# Patient Record
Sex: Female | Born: 1990 | Race: White | Hispanic: No | Smoking: Former smoker
Health system: Southern US, Community
[De-identification: ages and names within clinical notes are randomized; demographics above are authoritative.]

## PROBLEM LIST (undated history)

## (undated) DIAGNOSIS — K2270 Barrett's esophagus without dysplasia: Secondary | ICD-10-CM

## (undated) DIAGNOSIS — D649 Anemia, unspecified: Secondary | ICD-10-CM

## (undated) DIAGNOSIS — F32A Depression, unspecified: Secondary | ICD-10-CM

## (undated) DIAGNOSIS — F329 Major depressive disorder, single episode, unspecified: Secondary | ICD-10-CM

## (undated) DIAGNOSIS — F319 Bipolar disorder, unspecified: Secondary | ICD-10-CM

## (undated) DIAGNOSIS — K219 Gastro-esophageal reflux disease without esophagitis: Secondary | ICD-10-CM

## (undated) DIAGNOSIS — K5900 Constipation, unspecified: Secondary | ICD-10-CM

## (undated) DIAGNOSIS — F419 Anxiety disorder, unspecified: Secondary | ICD-10-CM

## (undated) HISTORY — DX: Constipation, unspecified: K59.00

## (undated) HISTORY — DX: Gastro-esophageal reflux disease without esophagitis: K21.9

## (undated) HISTORY — DX: Barrett's esophagus without dysplasia: K22.70

## (undated) HISTORY — PX: OOPHORECTOMY: SHX86

---
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
## 2008-01-01 ENCOUNTER — Ambulatory Visit (HOSPITAL_COMMUNITY): Payer: Self-pay | Admitting: Psychiatry

## 2008-01-13 ENCOUNTER — Ambulatory Visit (HOSPITAL_COMMUNITY): Payer: Self-pay | Admitting: Psychology

## 2008-02-03 ENCOUNTER — Ambulatory Visit (HOSPITAL_COMMUNITY): Payer: Self-pay | Admitting: Psychology

## 2008-10-19 ENCOUNTER — Emergency Department (HOSPITAL_COMMUNITY): Admission: EM | Admit: 2008-10-19 | Discharge: 2008-10-20 | Payer: Self-pay | Admitting: Emergency Medicine

## 2008-11-01 ENCOUNTER — Emergency Department (HOSPITAL_COMMUNITY): Admission: EM | Admit: 2008-11-01 | Discharge: 2008-11-01 | Payer: Self-pay | Admitting: Emergency Medicine

## 2008-11-02 ENCOUNTER — Inpatient Hospital Stay (HOSPITAL_COMMUNITY): Admission: EM | Admit: 2008-11-02 | Discharge: 2008-11-04 | Payer: Self-pay | Admitting: *Deleted

## 2008-11-02 ENCOUNTER — Ambulatory Visit: Payer: Self-pay | Admitting: *Deleted

## 2008-12-06 ENCOUNTER — Ambulatory Visit (HOSPITAL_COMMUNITY): Payer: Self-pay | Admitting: Psychology

## 2008-12-16 ENCOUNTER — Ambulatory Visit (HOSPITAL_COMMUNITY): Payer: Self-pay | Admitting: Psychiatry

## 2008-12-27 ENCOUNTER — Ambulatory Visit (HOSPITAL_COMMUNITY): Payer: Self-pay | Admitting: Psychology

## 2009-01-10 ENCOUNTER — Ambulatory Visit (HOSPITAL_COMMUNITY): Payer: Self-pay | Admitting: Psychology

## 2009-01-13 ENCOUNTER — Ambulatory Visit (HOSPITAL_COMMUNITY): Payer: Self-pay | Admitting: Psychiatry

## 2009-01-31 ENCOUNTER — Ambulatory Visit (HOSPITAL_COMMUNITY): Payer: Self-pay | Admitting: Psychology

## 2009-02-21 ENCOUNTER — Ambulatory Visit (HOSPITAL_COMMUNITY): Payer: Self-pay | Admitting: Psychology

## 2009-06-18 ENCOUNTER — Emergency Department (HOSPITAL_COMMUNITY): Admission: EM | Admit: 2009-06-18 | Discharge: 2009-06-18 | Payer: Self-pay | Admitting: Emergency Medicine

## 2009-07-10 ENCOUNTER — Encounter: Payer: Self-pay | Admitting: Orthopedic Surgery

## 2009-07-11 ENCOUNTER — Emergency Department (HOSPITAL_COMMUNITY): Admission: EM | Admit: 2009-07-11 | Discharge: 2009-07-12 | Payer: Self-pay | Admitting: Emergency Medicine

## 2009-07-12 ENCOUNTER — Ambulatory Visit: Payer: Self-pay | Admitting: Psychiatry

## 2009-07-12 ENCOUNTER — Inpatient Hospital Stay (HOSPITAL_COMMUNITY): Admission: AD | Admit: 2009-07-12 | Discharge: 2009-07-19 | Payer: Self-pay | Admitting: Psychiatry

## 2010-01-11 ENCOUNTER — Emergency Department (HOSPITAL_COMMUNITY): Admission: EM | Admit: 2010-01-11 | Discharge: 2010-01-11 | Payer: Self-pay | Admitting: Emergency Medicine

## 2010-01-13 ENCOUNTER — Emergency Department (HOSPITAL_COMMUNITY): Admission: EM | Admit: 2010-01-13 | Discharge: 2010-01-13 | Payer: Self-pay | Admitting: Emergency Medicine

## 2010-01-30 ENCOUNTER — Emergency Department (HOSPITAL_COMMUNITY): Admission: EM | Admit: 2010-01-30 | Discharge: 2010-01-30 | Payer: Self-pay | Admitting: Emergency Medicine

## 2010-04-17 NOTE — Letter (Signed)
Summary: *Orthopedic No Show Letter  [facility]  [address] [place] [address]  [place], [place] [address]   Phone: [phone]  Fax: [phone]      07/10/2009   Lyle Leisner [address] [place], [place]  [address]    Dear Ms. MARGENSEY,   Our records indicate that you missed your scheduled appointment with Dr. Beaulah Corin on 07/05/09.  Please contact this office to reschedule your appointment as soon as possible.  It is important that you keep your scheduled appointments with your physician, so we can provide you the best care possible.  We have enclosed an appointment card for your convenience.      Sincerely,   Dr. Terrance Mass, MD [facility] Phone [phone]

## 2010-05-29 LAB — URINALYSIS, ROUTINE W REFLEX MICROSCOPIC
Nitrite: NEGATIVE
Protein, ur: 30 mg/dL — AB
Urobilinogen, UA: 0.2 mg/dL (ref 0.0–1.0)

## 2010-05-29 LAB — URINE MICROSCOPIC-ADD ON

## 2010-05-29 LAB — URINE CULTURE: Colony Count: 30000

## 2010-05-30 LAB — URINALYSIS, ROUTINE W REFLEX MICROSCOPIC
Glucose, UA: NEGATIVE mg/dL
Leukocytes, UA: NEGATIVE
Nitrite: NEGATIVE
Protein, ur: 100 mg/dL — AB

## 2010-05-30 LAB — WET PREP, GENITAL
Trich, Wet Prep: NONE SEEN
Yeast Wet Prep HPF POC: NONE SEEN

## 2010-05-30 LAB — RPR: RPR Ser Ql: NONREACTIVE

## 2010-05-30 LAB — GC/CHLAMYDIA PROBE AMP, GENITAL: Chlamydia, DNA Probe: NEGATIVE

## 2010-05-30 LAB — URINE MICROSCOPIC-ADD ON

## 2010-05-31 ENCOUNTER — Emergency Department (HOSPITAL_COMMUNITY)
Admission: EM | Admit: 2010-05-31 | Discharge: 2010-05-31 | Disposition: A | Discharge status: Home / Self Care | Payer: No Typology Code available for payment source | Attending: Emergency Medicine | Admitting: Emergency Medicine

## 2010-05-31 ENCOUNTER — Emergency Department (HOSPITAL_COMMUNITY): Payer: No Typology Code available for payment source

## 2010-05-31 DIAGNOSIS — S93409A Sprain of unspecified ligament of unspecified ankle, initial encounter: Secondary | ICD-10-CM | POA: Insufficient documentation

## 2010-05-31 DIAGNOSIS — Y92009 Unspecified place in unspecified non-institutional (private) residence as the place of occurrence of the external cause: Secondary | ICD-10-CM | POA: Insufficient documentation

## 2010-05-31 DIAGNOSIS — X500XXA Overexertion from strenuous movement or load, initial encounter: Secondary | ICD-10-CM | POA: Insufficient documentation

## 2010-06-05 ENCOUNTER — Other Ambulatory Visit (HOSPITAL_COMMUNITY)
Admission: RE | Admit: 2010-06-05 | Discharge: 2010-06-05 | Disposition: A | Discharge status: Home / Self Care | Payer: No Typology Code available for payment source | Source: Ambulatory Visit | Attending: Nurse Practitioner | Admitting: Nurse Practitioner

## 2010-06-05 ENCOUNTER — Other Ambulatory Visit (HOSPITAL_COMMUNITY)
Admission: RE | Admit: 2010-06-05 | Discharge: 2010-06-05 | Disposition: A | Discharge status: Home / Self Care | Payer: No Typology Code available for payment source | Source: Ambulatory Visit | Attending: Family Medicine | Admitting: Family Medicine

## 2010-06-05 ENCOUNTER — Inpatient Hospital Stay (HOSPITAL_COMMUNITY): Admission: RE | Admit: 2010-06-05 | Payer: Self-pay | Source: Ambulatory Visit

## 2010-06-05 ENCOUNTER — Other Ambulatory Visit: Payer: Self-pay | Admitting: Nurse Practitioner

## 2010-06-05 DIAGNOSIS — N870 Mild cervical dysplasia: Secondary | ICD-10-CM | POA: Insufficient documentation

## 2010-06-05 DIAGNOSIS — R87610 Atypical squamous cells of undetermined significance on cytologic smear of cervix (ASC-US): Secondary | ICD-10-CM | POA: Insufficient documentation

## 2010-06-05 LAB — RAPID URINE DRUG SCREEN, HOSP PERFORMED
Barbiturates: NOT DETECTED
Benzodiazepines: POSITIVE — AB
Cocaine: POSITIVE — AB

## 2010-06-05 LAB — CBC
HCT: 37.5 % (ref 36.0–46.0)
Hemoglobin: 12.9 g/dL (ref 12.0–15.0)
MCHC: 34.4 g/dL (ref 30.0–36.0)
MCV: 90.2 fL (ref 78.0–100.0)
RBC: 4.16 MIL/uL (ref 3.87–5.11)
RDW: 14.3 % (ref 11.5–15.5)

## 2010-06-05 LAB — BASIC METABOLIC PANEL
CO2: 29 mEq/L (ref 19–32)
Calcium: 8.9 mg/dL (ref 8.4–10.5)
Chloride: 107 mEq/L (ref 96–112)
Glucose, Bld: 73 mg/dL (ref 70–99)
Potassium: 3.9 mEq/L (ref 3.5–5.1)
Sodium: 140 mEq/L (ref 135–145)

## 2010-06-05 LAB — URINALYSIS, ROUTINE W REFLEX MICROSCOPIC
Bilirubin Urine: NEGATIVE
Glucose, UA: NEGATIVE mg/dL
Hgb urine dipstick: NEGATIVE
Ketones, ur: NEGATIVE mg/dL
Protein, ur: NEGATIVE mg/dL
pH: 6.5 (ref 5.0–8.0)

## 2010-06-05 LAB — URINE MICROSCOPIC-ADD ON

## 2010-06-05 LAB — HEPATIC FUNCTION PANEL
ALT: 10 U/L (ref 0–35)
Albumin: 4.2 g/dL (ref 3.5–5.2)
Alkaline Phosphatase: 53 U/L (ref 39–117)
Total Bilirubin: 0.5 mg/dL (ref 0.3–1.2)

## 2010-06-05 LAB — POCT PREGNANCY, URINE: Preg Test, Ur: NEGATIVE

## 2010-06-06 LAB — URINALYSIS, ROUTINE W REFLEX MICROSCOPIC
Bilirubin Urine: NEGATIVE
Glucose, UA: NEGATIVE mg/dL
Ketones, ur: NEGATIVE mg/dL
Leukocytes, UA: NEGATIVE
Nitrite: NEGATIVE
Specific Gravity, Urine: 1.02 (ref 1.005–1.030)
pH: 8.5 — ABNORMAL HIGH (ref 5.0–8.0)

## 2010-06-06 LAB — URINE MICROSCOPIC-ADD ON

## 2010-06-23 LAB — RAPID URINE DRUG SCREEN, HOSP PERFORMED
Amphetamines: NOT DETECTED
Barbiturates: NOT DETECTED
Benzodiazepines: NOT DETECTED
Opiates: NOT DETECTED

## 2010-06-23 LAB — BASIC METABOLIC PANEL
BUN: 17 mg/dL (ref 6–23)
Calcium: 9.8 mg/dL (ref 8.4–10.5)
Chloride: 103 mEq/L (ref 96–112)
Creatinine, Ser: 1.09 mg/dL (ref 0.4–1.2)

## 2010-06-23 LAB — ETHANOL: Alcohol, Ethyl (B): <5 mg/dL (ref 0–10)

## 2010-06-23 LAB — URINALYSIS, MICROSCOPIC ONLY
Glucose, UA: NEGATIVE mg/dL
Hgb urine dipstick: NEGATIVE
Nitrite: NEGATIVE
Specific Gravity, Urine: 1.031 — ABNORMAL HIGH (ref 1.005–1.030)
pH: 5.5 (ref 5.0–8.0)

## 2010-06-23 LAB — CBC
MCV: 86.5 fL (ref 78.0–100.0)
Platelets: 286 10*3/uL (ref 150–400)
WBC: 14.4 10*3/uL — ABNORMAL HIGH (ref 4.0–10.5)

## 2010-06-23 LAB — DIFFERENTIAL
Eosinophils Relative: 1 % (ref 0–5)
Lymphocytes Relative: 21 % (ref 12–46)
Lymphs Abs: 3.1 10*3/uL (ref 0.7–4.0)

## 2010-07-04 ENCOUNTER — Emergency Department (HOSPITAL_COMMUNITY)
Admission: EM | Admit: 2010-07-04 | Discharge: 2010-07-05 | Disposition: A | Discharge status: Home / Self Care | Payer: No Typology Code available for payment source | Attending: Emergency Medicine | Admitting: Emergency Medicine

## 2010-07-31 NOTE — Discharge Summary (Signed)
NAME:  MIKELLE, MYRICK NO.:  [id number]   MEDICAL RECORD NO.:  [id number]          PATIENT TYPE:  IPS   LOCATION:  0302                          FACILITY:  [facility]   PHYSICIAN:  Jasmine Pang, M.D. DATE OF BIRTH:  26-Feb-1991   DATE OF ADMISSION:  11/02/2008  DATE OF DISCHARGE:  11/04/2008                               DISCHARGE SUMMARY   IDENTIFICATION:  This is a 20 year old single white female from [place],  [place] who was admitted on a voluntary basis on November 02, 2008.   HISTORY OF PRESENT ILLNESS:  The patient had been endorsing mood swings.  She had a plan to overdose.  She admits to use of alcohol, marijuana,  cocaine, and opiates.  She has not been sleeping.  She has had a poor  appetite.  She states I have lost all my friend. as well as 2-1/2-year  relationship with her partner.  She states that she has bipolar  disorder, but has not been on medications since she was 20 years old.  She was on Wellbutrin, Abilify, and Adderall.  She was suicidal before  she came to the hospital.  She states she has dramatic mood swings  with episodes of anger and this has resulted in a loss of her friends.   PHYSICAL FINDINGS:  There were no acute physical or medical problems  noted. Physical exam was done at [facility].   DIAGNOSTIC STUDIES:  WBC was 14.4.  Alcohol level was less than 5.  UDS  was positive for marijuana.   HOSPITAL COURSE:  Upon admission, the patient was started on Ambien 5 mg  p.o. q.h.s. p.r.n. insomnia, may repeat x1 and Ativan 1 mg p.o. q.6  hours p.r.n. anxiety and agitation.  In individual sessions, the patient  had psychomotor retardation.  Speech was soft and slow.  Mood was  depressed and anxious. There were no evidence of thought disorder or  psychosis.  She was started on Depakote tablets 500 mg p.o. q.h.s.  She  was having diarrhea and required some Imodium.  Urine pregnancy test was  done, which was negative.  As  hospitalization progressed, her mood began  to improve.  Her sleep was still poor and appetite poor.  She focused on  breakup of relationship with her significant other, this is caused her a  lot of distress.  She does state she has group of very supportive  friends who will be with her when she is discharged and help keep her  company in stable.  On November 04, 2008, sleep was good, appetite was  improving.  Psychomotor activity was within normal limits.  Speech was  normal rate and flow.  Mood was less depressed and less anxious, not  irritable.  Affect consistent with mood.  There was no suicidal or  homicidal ideation.  There was no auditory or visual hallucinations.  No  paranoia or delusions.  Thoughts were logical and goal-directed.  Thought content, no predominant theme.  Cognitive was grossly intact.  Insight good,  judgment good.  Impulse control good.  It was felt the  patient was  safe for discharge today and she wanted to go home.  She was  having no side effects to her medications.   DISCHARGE DIAGNOSES:  Axis I: Bipolar disorder, not otherwise specified,  and also polysubstance abuse.  Axis II:  Features of personality disorder, not otherwise specified.  Axis III:  None.  Axis IV:  Severe (problems with primary support group, problems related  to social environment, burden of psychiatric illness, burden of chemical  dependence and chemical abuse, housing problem, other psychosocial  problems.  Axis V:  Global assessment of functioning was 50 upon discharge.  Global  assessment of functioning was 35 upon admission.  Global assessment of  functioning highest past year was 65-70.   DISCHARGE PLANS:  There were no specific activity level or dietary  restrictions.   POSTHOSPITAL CARE PLANS:  The patient will go to [facility] on November 08, 2008 at 8 o'clock a.m.   DISCHARGE MEDICATIONS:  1. Depakote tablets 500 mg at bedtime.  2. Ambien 5 mg at bedtime  p.r.n. insomnia.      Jasmine Pang, M.D.  Electronically Signed     BHS/MEDQ  D:  11/04/2008  T:  11/05/2008  Job:  [id number]

## 2010-08-03 NOTE — Discharge Summary (Signed)
[facility]  Patient:    Kimberly Olsen, Kimberly Olsen                     MRN: [id number] Adm. Date:  [id number] Disc. Date: 04/29/00 Attending:  Veneta Penton                           Discharge Summary  REASON FOR ADMISSION:  This 20 year old white female was admitted for suicidal ideation with a plan to harm herself by cutting her wrists and homicidal ideation towards a peer in school that she planned to kill.  For further history of present illness, please see the patients psychiatric admission assessment.  PHYSICAL EXAMINATION:  At the time of admission was significant only for a history of seasonal allergic rhinitis for which she took Claritin.  Her physical examination was otherwise unremarkable.  LABORATORY EXAMINATION:  The patient underwent a laboratory work-up to rule out any medical problems contributing to her symptomatology.  CBC showed an MCHC of 34.3.  Hepatic panel was unremarkable.  Metabolic panel was within normal limits.  Thyroid function tests were within normal limits.  An RPR was nonreactive.  A urine drug screen was positive for amphetamines and consistent with the patient taking Dexedrine spansules for attention deficit hyperactivity disorder.  It was otherwise negative.   UA was unremarkable. Urine pregnancy test was negative.  Urine probe for gonorrhea and chlamydia was negative.  Patient received no x-rays, no special procedures, no additional consultations.  She sustained no complications during the course of this hospitalization.  HOSPITAL COURSE:  The patient rapidly adapted to unit routine, socializing well with both patients and staff.  She was oppositional and defiant and irritable.  Affect and mood were depressed and angry.  She showed decreased concentration and attention span, poor impulse control, was hyperactive. After 24  to 48 hours of hospitalization, she began denying any active suicidal ideation.  She was begun  on a trial of Effexor XR and titrated up to a therapeutic dose.  She was continued on Dexedrine spansules.  At the time of discharge, the patient is participating in all aspects of the therapeutic treatment program.  She denies any homicidal or suicidal ideation.  Her affect and mood have improved.  She is able to perform all her activities of daily living and is felt to have reached her maximum benefits of hospitalization. Consequently it felt the patient is ready for discharge to home.  CONDITION ON DISCHARGE:  Improved  FINAL DIAGNOSIS: Axis I:    1. Major depression, single episode, severe, without psychosis.            2. Attention deficit hyperactivity disorder, combined type.            3. Oppositional-defiant disorder.            4. Rule out conduct disorder. Axis II:   Rule out learning disorder not otherwise specified. Axis III:  Seasonal allergic rhinitis. Axis IV:   Current psychosocial stressors are severe. Axis V:    Code 20 on admission, code 30 on discharge.  FURTHER EVALUATION AND TREATMENT RECOMMENDATIONS: 1. The patient is discharged to home. 2. The patient is discharged on an unrestricted level of activity and    a regular diet. 3. She will follow up with the  community mental health center for all    further aspects of her mental health care, and consequently I will sign off  on the case at this time.  DISCHARGE MEDICATIONS: 1. Dexedrine spansules 30 mg p.o. qam 2. Effexor XR 75 mg p.o. q.a.m. with food. DD:  04/29/00 TD:  04/29/00 Job: [id number]

## 2010-08-03 NOTE — H&P (Signed)
NAME:  Kimberly Olsen, Kimberly Olsen NO.:  [id number]   MEDICAL RECORD NO.:  [id number]          PATIENT TYPE:  INP   LOCATION:  0602                          [facility]ACILITY:  [facility]   PHYSICIAN:  Lalla Brothers, MDDATE OF BIRTH:  December 01, 1990   DATE OF ADMISSION:  08/17/2005  DATE OF DISCHARGE:                         PSYCHIATRIC ADMISSION ASSESSMENT   IDENTIFICATION:  20 year old female who dropped out of [employer] in the 11th grade is admitted emergently involuntarily on a  [facility] petition for commitment in transfer from [facility] emergency department for inpatient stabilization and treatment of  suicide attempt and depression.  The patient overdosed with a half bottle of  aspirin and apparently 10 Xanax 1 mg each, arriving to the hospital and  receiving activated charcoal approximately 40 minutes later.  Her 1 hour  post ingestion salicylate level was 8.3, dropping to 7.6 mg/dL three and  half hours after ingestion which was at approximately midnight.  Urine drug  screen did not document her Adderall or her overdose of Xanax thus far.  She  was transferred when definitely medically stable with her emergency  department assessment documenting need for continued mental health care for  which father and patient were also resistant at the time of her last  inpatient stay in October 2005.   HISTORY OF PRESENT ILLNESS:  The patient apparently is residing with father  as she was living with father and his girlfriend at the time of her last  hospitalization December 19, 2003 through December 26, 2003 at the [facility].  The patient had been receiving mental health care at  [facility] health and as of last admission was seeing Dr.  Mariana Single for psychiatric follow-up there.  Since her Last hospitalization,  Zoloft as been discontinued and she is on low-dose Wellbutrin.  Abilify has  been increased and Adderall was  continued.  She is no longer taking Seroquel  but she is taking Equetro.  At the time of admission, her dosing of  medication is Wellbutrin 150 mg XL every morning, Adderall 30 mg every  morning, Equetro  200 mg morning and bedtime, and Abilify 50 mg every  bedtime.  She also takes Allegra 180 mg in the morning when needed for  allergic rhinitis and is on Ortho Tri-Cyclen every morning for  contraception.  The patient reports med compliance with her urine drug  screen is negative for amphetamine.  She suggests her last dose of  medications had been on the morning of August 16, 2005.  She suggests that her  last cannabis was a day before admission and her urine drug screen is  positive for cannabis.  She has a history of using alcohol and Xanax and she  reported an overdose of 10 Xanax 1 mg each along with the half bottle of  aspirin at midnight before coming to the emergency room by 0040 hours on  August 17, 2005.  The patient is now smoking one-pack per day of cigarettes.  She had a previous psychiatric hospitalization April 23, 2000 through  April 29, 2000 at [facility] the care of Dr. Haynes Hoehn with  homicidal or suicidal ideation treated with Effexor and Dexedrine at that  time.  Her primary diagnosis then was major depression with ODD suspicious  for evolving conduct disorder.  The patient had a therapist in [place] in  the year 2000 and had worked with Dr. Mariana Single at the [facility] initially, having diagnosis of ADHD and ODD as well as Dr.  Mariana Single making the diagnosis  of bipolar disorder.  The patient provides  little current information about her status.  She has dropped out of school  and is not having responsibilities that require Adderall at this time.  She  is having no manic or psychotic symptoms at this time.  She does seem  significantly depressed.  She is withdrawn, sad and has anhedonia.  She has  psychomotor retardation.  The  patient does not acknowledge specific anxiety.  She continues to have borderline formulation  in her interpersonal relations  and anger.  She does not acknowledge any intercurrent organic central  nervous system trauma.   PAST MEDICAL HISTORY:  The patient is under the outpatient care of Kirstie Peri, M.D.  She is on Ortho Tri-Cyclen and her last menses reportedly was  May 29,2007, taking her pill in the morning.  She has a history of allergic  rhinitis now taking her Allegra 180 mg in the morning p.r.n.Marland Kitchen  She has a  history of microcytic anemia treated with iron and had some syncopal type  symptoms in the past possibly associated with her anemia.  Currently her  anemia appears resolved according to CBC in the emergency department.  Although UCG was negative in the emergency department, UDS was also negative  except for cannabis.  As overdose may effect enzymatic assay, we will repeat  these studies as well as recheck urinalysis and comprehensive metabolic  panel for any insult from the overdose.  In [facility] ICU in October 2005,  her EKG revealed QTC of 416 milliseconds, thereby normal.  The emergency  department prior to transfer, her QTC was 447 milliseconds therefore upper  limit of normal though she had overdosed.  She had allergy to coconut  manifested by hives.  She has had no known seizure.  She has had no known  heart murmur or arrhythmia.   REVIEW OF SYSTEMS:  The patient denies difficulty with gait, gaze or  continence.  She denies headache or sensory loss at this time.  She has no  memory impairment or coordination difficulty.  She denies any exposure to  communicable disease or toxins.  She has no rash, jaundice or purpura  currently.  There is no chest pain, palpitations or presyncope.  There is no  abdominal pain, nausea, vomiting or diarrhea.  There is no dysuria or  arthralgia.   Immunizations are up-to-date.  FAMILY HISTORY:  The patient apparently resides with  father in [place].  As of her first hospitalization at the [facility],  father resided in [place] and there is a stepmother in the area.  Father  reported having bipolar disorder and had symptoms of ADHD.  Mother has  subsequently reported having bipolar disorder.  There is a great grandmother  with schizophrenia by history.  As of last admission, the patient was  residing with father and girlfriend.   SOCIAL AND DEVELOPMENTAL HISTORY:  The patient has now dropped out of the  11th grade and at [employer] this fall.  She does not  identify any definite responsibilities for herself.  She acknowledges sexual  activity.  She uses cannabis including the day before admission.  She has  used alcohol and Xanax in the past and reports a Xanax overdose at time of  admission.  She denies other legal consequences.   ASSETS:  The patient is less aggressive to others compared to last  admission.   MENTAL STATUS EXAM:  Height is 67 inches up from 66 inches in October 2005.  Weight is 206 pounds up from 171 pounds in October 2005.  Blood pressure is  138/71 with heart rate of 97 sitting and 141/81 with heart rate of 100  standing.  She is right-handed.  She is alert and oriented.  She offers a  paucity of spontaneous verbal communication.  Cranial nerves appear intact  but there is little speech to assess.  Alternating motion rates are 0/0.  Muscle strength and tone are normal.  There are no pathologic reflexes or  soft neurologic findings.  There are no abnormal involuntary movements.  Gait and gaze are intact.  The patient is withdrawn, anhedonic and under  reactive.  She has severe dysphoria though with hypersensitivity and  rejection sensitivity to the comments and reactions of others.  She appears  to be overeating and overweight.  She appears to have leaden fatigue and  impulse control difficulties including for anger.  She has identity  diffusion and  confusion with borderline features.  Attention span on  admission appears adequate for current responsibilities.  She is not  hyperactive though she is impulsive.  She has no psychotic or manic symptoms  at this time though she has been documented and evolving bipolar disorder  pattern to the past.  She is now on Equetro in addition to her Abilify.  She  does not present dissociative symptoms or homicidal ideation.  She does have  suicidal ideation.  She is interpersonally negative and uninterested.  She  has had suicide attempt currently as well as suicide attempt at the time of  her last admission in October 2005 and was suicidal and homicidal in  February 2002.   IMPRESSION:  AXIS I:  1.  Bipolar disorder, depressed, severe.  2.  Oppositional defiant disorder.  3.  Cannabis abuse.  4.  Attention deficit hyperactivity disorder, residual phase.  5.  Identity disorder with borderline features.  6.  Other interpersonal problem. 7.  Other specified family circumstances  8.  Parent child problem.  AXIS II:  Diagnosis deferred.  AXIS III:  1.  Aspirin and possible Xanax overdose.  2.  Cigarette smoking.  3.  Allergic rhinitis.  4.  Birth control pills.  5.  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  6.  Overweight.  7.  Borderline QTC of 447 milliseconds with overdose  AXIS IV:  Stressors family severe acute and chronic; school and peer  relations severe acute and chronic; phase of life severe acute and chronic.  AXIS V:  Global assessment of functioning on admission 35 with highest in  last year 60.   PLAN:  The patient is admitted for inpatient adolescent psychiatric and  multidisciplinary multimodal behavioral health treatment in a team-based  program at a locked psychiatric unit.  At this time we will discontinue  Adderall as the patient is not in school or undertaking other  responsibilities they can be determined.  It may be appropriate to increase  Wellbutrin depending upon  clarification of baseline mood currently  independent of overdose.  Carbamazepine level will be pending and compliance  with all treatment is suspect.  We will recheck UDS in this regard and  enzymatically will also recheck UCG.  Will recheck CMP and UA regarding any  internal organ insult from her overdose.  Cognitive behavioral therapy,  anger management, identity consolidation, individuation and separation,  substance abuse intervention, social and communication skills and problem-  solving and coping skills can be undertaken.  Estimated length stay is 7  days with target symptoms for discharge being stabilization of suicide risk  and mood, stabilization of dangerous disruptive behavior, and restoration of  communication and containment in daily life for adequate capacity to  participate in outpatient treatment including continuing her Abilify,  Equetro and Wellbutrin.      Lalla Brothers, MD  Electronically Signed     GEJ/MEDQ  D:  08/17/2005  T:  08/18/2005  Job:  [id number]

## 2010-08-03 NOTE — Discharge Summary (Signed)
NAME:  Kimberly Olsen, Kimberly Olsen NO.:  [id number]   MEDICAL RECORD NO.:  [id number]          PATIENT TYPE:  INP   LOCATION:  0101                          FACILITY:  [facility]   PHYSICIAN:  Lalla Brothers, MDDATE OF BIRTH:  08-08-1990   DATE OF ADMISSION:  08/17/2005  DATE OF DISCHARGE:  08/23/2005                                 DISCHARGE SUMMARY   CONTINUATION:   FINAL DIAGNOSES:  AXIS III:  Aspirin overdose, cigarette smoking, allergic  rhinitis, birth control pills, allergy to COCONUT, manifested by urticaria,  overweight, borderline QTC of 447 milliseconds at the time of overdose,  bacteriuria on poor clean catch, treated with Cipro.  AXIS IV:  Stressors:  Family--severe, acute and chronic; school and peer  relations--severe, acute and chronic; phase of life--severe, acute and  chronic.  AXIS V:  GAF on admission 35; highest in last year 60; discharge GAF 55.   CONDITION ON DISCHARGE:  The patient was discharged in improved condition to  parents, free of suicidal ideation.   ACTIVITY/DIET:  She follows a weight control diet and has no restrictions on  physical activity.  Crisis and safety plans are outlined if needed.  Adderall was discontinued.  She is discharged on the following medications.   DISCHARGE MEDICATIONS:  1.  Wellbutrin 150 mg XL, to take 3 every morning; quantity #90 with no      refill prescribed.  2.  Equetro 200 mg tablet, to take 1 every morning and 2 every bedtime;      quantity #90 with no refill prescribed.  3.  Abilify 15 mg every bedtime; quantity #30 with no refill prescribed.  4.  Ortho Tri-Cyclen, 1 every morning; own home supply.  5.  Allegra 180 mg in the morning when needed; own home supply.   They were educated on the medication including FDA guidelines and black box  warnings.   FOLLOWUP:  The patient will have medication check with Dr. Toni Arthurs at [facility] in [place] September 27, 2005 at 12 noon so that  the refill was added to her discharge medications.  She will see Dr.  Kieth Brightly for therapy there as well August 30, 2005 at 1500.      Lalla Brothers, MD  Electronically Signed     GEJ/MEDQ  D:  08/27/2005  T:  08/27/2005  Job:  [id number]   cc:   Onalee Hua L. Toni Arthurs, M.D.  Fax: [phone]   Hershal Coria, Psy.D.  Fax: [phone]
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
## 2010-08-03 NOTE — Discharge Summary (Signed)
NAME:  Kimberly Olsen, Kimberly Olsen NO.:  [id number]   MEDICAL RECORD NO.:  [id number]          PATIENT TYPE:  INP   LOCATION:  0101                          FACILITY:  [facility]   PHYSICIAN:  Lalla Brothers, MDDATE OF BIRTH:  [birth year]   DATE OF ADMISSION:  08/17/2005  DATE OF DISCHARGE:  08/23/2005                                 DISCHARGE SUMMARY   IDENTIFICATION:  This 20 year old female, who dropped out of [employer] in the 11th grade since her last admission here in October of  2005, is now admitted emergently involuntarily on a [facility]  petition for commitment in transfer from [facility]  Emergency Department for inpatient stabilization and treatment of suicide  attempt and depression.  The patient overdosed with a half a bottle of  aspirin and apparently 10 Xanax 1 mg each by history, though Xanax was never  documented in the urine drug screen in the emergency department with the  patient arriving 40 minutes after overdose, receiving charcoal.  Salicylate  level was low at 8.3, dropping to 7.6 three and a half hours after  ingestion.  She was declared medically stable and transferred for mental  health treatment.  Her urine drug screen also did not detect Adderall.  For  full details, please see the typed admission assessment.   SYNOPSIS OF PRESENT ILLNESS:  The patient lives with father and his  girlfriend no longer lives there.  The patient suggests that father spends  most of his time watching television and does not answer the phone other  than his cell phone.  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  She  is highly withdrawn and avoidant in a depressive fashion at the time of  admission.  She is on low-dose Wellbutrin now and Zoloft has been  discontinued.  Abilify has been increased and Adderall has been continued.  She is no longer  taking Seroquel but is now on Equetro since last admission.  Therefore, at the time of admission, she is on Wellbutrin 150 mg XL every  morning, Adderall 30 mg every morning, Equetro 200 mg morning and bedtime  and Abilify 15 mg every bedtime.  She is on Ortho Tri-Cyclen every morning  for contraception and Allegra 180 mg daily p.r.n.  She also had a [facility] admission in February of 2002 at which time she had major  depression treated with Effexor and Dexedrine for ADHD.  She started therapy  at least by the year 2000 in [place] and worked most with Dr. Mariana Single,  though she has not been able to see Dr. Mariana Single at [facility] any longer.  She has anhedonia, psychomotor retardation, and suicidal  ideation currently.  Mother apparently has bipolar disorder and great-  grandmother has schizophrenia.  Father reports having bipolar disorder and  symptoms of ADHD, though family history was doubtful as to clarity during  her previous hospitalizations.   INITIAL MENTAL STATUS EXAM:  Weight is up from 171  pounds to 206 pounds and  neurological exam is intact with the patient right-handed.  She has  anhedonia, withdrawal and psychomotor retardation.  She has hypersensitivity  and rejection sensitivity to the comments or actions of others with severe  dysphoria.  Attention span is adequate now and she has no psychotic  symptoms.  She is suicidal now as in the past.   LABORATORY FINDINGS:  At [facility] Emergency Department, salicylate  level was initially 8.3, dropping to 7.6 and acetaminophen level was less  than 10, both testings.  Comprehensive metabolic panel was normal with  sodium 139, potassium 3.5, random glucose 90, creatinine 0.9, calcium 8.8,  albumin 3.8, AST 16 and ALT 11.  Blood gas was negative except pO2 slightly  low at 69 with lower limit of normal 83 and bicarb 26 with upper limit of  normal 23 and O2 saturation 94 compared to  lower limit of normal 95%.  CBC  was normal except white count elevated at 16,800 with hemoglobin normal at  13, MCV at 86 and platelet count 368,000.  Urinalysis was a poor clean catch  in the emergency department with specific gravity of greater than 1.030, pH  5, 5-10 wbc, 0-5 rbc, 10-20 epithelial and moderate bacteria with leukocyte  esterase negative.  Urine pregnancy test was negative.  Urine drug screen  was positive only for cannabis, otherwise negative.  At the [facility], urinalysis for specific gravity of 1.026, noted small amount  leukocyte esterase with 3-6 wbc and a few epithelial.  Urine probe for  gonorrhea and chlamydia trachomatis by DNA amplification were both negative.  Urine drug screen was negative including for cannabis with creatinine of 168  mg/dL on August 17, 2005, documenting adequate specimen.  TSH was normal at  0.96.  Lipid panel was normal with total cholesterol 127, HDL 40, LDL 64 and  triglyceride 114.  Repeat comprehensive metabolic panel was normal with  sodium 140, potassium 3.7, fasting glucose 97, AST 16, ALT 9, GGT 19 and  calcium 8.7 with creatinine of 0.8.  Total protein was slightly low at 5.6  with lower limit of normal 6 and albumin 2.9 with lower limit of normal 3.5.  Two days prior to discharge, however, most abnormalities were resolved.  White count was 10,100 with upper limit of normal 10,000 with hemoglobin  13.8, MCV of 86 and platelet count 337,000.  On the two days prior to  discharge, comprehensive metabolic panel was normal with sodium 140,  potassium 3.9, fasting glucose 86, AST 17, ALT 14, albumin 3.6, total  protein 7.1 and calcium 9.6.  On 400 mg of Equetro daily, in divided doses,  carbamazepine level was 3.0 mcg/mL 10 hours after dose.  On 600 mg daily in  divided dose, carbamazepine level was 6.5 mcg/mL 10 hours after evening dose  with reference range 4-12.  EKG in the emergency department was normal except for  borderline T-wave abnormalities in the anterior leads and sinus  tachycardia with rate of 104.  QTC was upper limit of normal at 447  milliseconds after the salicylate and possible benzodiazepine overdose.  T-  waves were flattened in the anterior leads.   HOSPITAL COURSE AND TREATMENT:  General medical exam by Mallie Darting PA-C  noted allergy to COCONUT and previous surgical removal of a cyst from the  right index finger.  She smokes a half-pack per day of cigarettes and notes  cannabis at least a couple times weekly.  The patient  considers that mother  has schizophrenia and father bipolar.  She had menarche at age 9 and is  sexually active.  The patient thinks she is anemic and does have obesity.  Remainder of exam was intact.  Height was 67 inches and weight 206 pounds on  admission, up from 171 pounds in October of 2005; discharge weight was 208  pounds.  Initial blood pressure was 109/63 with heart rate of 71 (supine)  and 136/82 with heart rate of 80 (standing).  At the time of discharge,  sitting blood pressure was 120/70 with heart rate of 73 and standing blood  pressure 130/75 with heart rate of 111.  The patient received a three-day  course of Cipro 250 mg b.i.d. from [facility] PA-C for the poor clean  catch borderline abnormal urinalysis in the emergency department,  particularly being sexually active and on Ortho Tri-Cyclen birth control  pill.  The patient tolerated this medication well.  Her Wellbutrin was  titrated up during the hospital stay to a final dose of 450 mg XL every  morning.  Equetro was titrated up to 200 mg in the morning and 400 mg at  bedtime.  Abilify was continued without change and Adderall was  discontinued.  The patient participated in all aspects of active inpatient  treatment including family therapy with both parents.  Father's cell phone  is [phone] as the best way to reach him.  Father was not available for the  first half of the hospital stay  but was there for discharge as was mother.  The patient was able to rework family conflicts and projections as well as  her own maladaptive identifications and acting out.  She became much more  appropriately invested in family including extended family.  The patient  disengaged from her self-defeat and destructive acting out and began to work  constructively to rebuild relations and communication with family.  The  patient's mood significantly improved and she had no hypomania or suicide-  related side effects.  She had no preseizure signs or symptoms.  She was  discharged in improved condition, motivated to complete her GED exams and to  return to work.  She required no seclusion or restraint during the hospital  stay.   FINAL DIAGNOSES:  AXIS I:  Bipolar disorder, depressed, severe.  Oppositional defiant disorder.  Attention-deficit hyperactivity disorder,  residual phase.  Cannabis abuse.  Identity disorder with borderline features.  Parent-child problem.  Other specified family circumstances.  Other interpersonal problem.  AXIS II:  Diagnosis deferred.  AXIS III:  Aspirin overdose, cigarette smoking, allergic rhinitis, birth  control pills, allergy to COCONUT, manifested by hives, overweight,  borderline QTC of 447 milliseconds at the time of overdose. . .   Dictation ended at this point.      Lalla Brothers, MD  Electronically Signed     GEJ/MEDQ  D:  08/27/2005  T:  08/27/2005  Job:  [id number]
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
## 2010-08-22 ENCOUNTER — Emergency Department (HOSPITAL_COMMUNITY)
Admission: EM | Admit: 2010-08-22 | Discharge: 2010-08-23 | Disposition: A | Discharge status: Home / Self Care | Payer: Medicaid Other | Attending: Emergency Medicine | Admitting: Emergency Medicine

## 2010-08-22 ENCOUNTER — Emergency Department (HOSPITAL_COMMUNITY): Payer: Medicaid Other

## 2010-08-22 DIAGNOSIS — F319 Bipolar disorder, unspecified: Secondary | ICD-10-CM | POA: Insufficient documentation

## 2010-08-22 DIAGNOSIS — F341 Dysthymic disorder: Secondary | ICD-10-CM | POA: Insufficient documentation

## 2010-08-22 DIAGNOSIS — W268XXA Contact with other sharp object(s), not elsewhere classified, initial encounter: Secondary | ICD-10-CM | POA: Insufficient documentation

## 2010-08-22 DIAGNOSIS — S61209A Unspecified open wound of unspecified finger without damage to nail, initial encounter: Secondary | ICD-10-CM | POA: Insufficient documentation

## 2010-08-22 DIAGNOSIS — Y92009 Unspecified place in unspecified non-institutional (private) residence as the place of occurrence of the external cause: Secondary | ICD-10-CM | POA: Insufficient documentation

## 2010-08-22 DIAGNOSIS — O9989 Other specified diseases and conditions complicating pregnancy, childbirth and the puerperium: Secondary | ICD-10-CM | POA: Insufficient documentation

## 2010-08-22 DIAGNOSIS — S61509A Unspecified open wound of unspecified wrist, initial encounter: Secondary | ICD-10-CM | POA: Insufficient documentation

## 2010-08-30 ENCOUNTER — Emergency Department (HOSPITAL_COMMUNITY)
Admission: EM | Admit: 2010-08-30 | Discharge: 2010-08-30 | Disposition: A | Discharge status: Home / Self Care | Payer: Medicaid Other | Attending: Emergency Medicine | Admitting: Emergency Medicine

## 2010-08-30 DIAGNOSIS — Z4802 Encounter for removal of sutures: Secondary | ICD-10-CM | POA: Insufficient documentation

## 2010-09-24 ENCOUNTER — Other Ambulatory Visit: Payer: Self-pay | Admitting: Family Medicine

## 2010-09-24 ENCOUNTER — Other Ambulatory Visit (HOSPITAL_COMMUNITY)
Admission: RE | Admit: 2010-09-24 | Discharge: 2010-09-24 | Disposition: A | Discharge status: Home / Self Care | Payer: Medicaid Other | Source: Ambulatory Visit | Attending: Obstetrics and Gynecology | Admitting: Obstetrics and Gynecology

## 2010-09-24 DIAGNOSIS — Z01419 Encounter for gynecological examination (general) (routine) without abnormal findings: Secondary | ICD-10-CM | POA: Insufficient documentation

## 2010-09-24 DIAGNOSIS — Z113 Encounter for screening for infections with a predominantly sexual mode of transmission: Secondary | ICD-10-CM | POA: Insufficient documentation

## 2010-11-24 ENCOUNTER — Emergency Department (HOSPITAL_COMMUNITY)
Admission: EM | Admit: 2010-11-24 | Discharge: 2010-11-24 | Disposition: A | Discharge status: Home / Self Care | Payer: Medicaid Other | Attending: Emergency Medicine | Admitting: Emergency Medicine

## 2010-11-24 DIAGNOSIS — H539 Unspecified visual disturbance: Secondary | ICD-10-CM | POA: Insufficient documentation

## 2010-11-24 DIAGNOSIS — R109 Unspecified abdominal pain: Secondary | ICD-10-CM | POA: Insufficient documentation

## 2010-11-24 DIAGNOSIS — R51 Headache: Secondary | ICD-10-CM | POA: Insufficient documentation

## 2010-11-24 DIAGNOSIS — R42 Dizziness and giddiness: Secondary | ICD-10-CM | POA: Insufficient documentation

## 2010-11-24 DIAGNOSIS — R110 Nausea: Secondary | ICD-10-CM | POA: Insufficient documentation

## 2010-11-24 DIAGNOSIS — E860 Dehydration: Secondary | ICD-10-CM

## 2010-11-24 DIAGNOSIS — Z331 Pregnant state, incidental: Secondary | ICD-10-CM | POA: Insufficient documentation

## 2010-11-24 DIAGNOSIS — Z862 Personal history of diseases of the blood and blood-forming organs and certain disorders involving the immune mechanism: Secondary | ICD-10-CM | POA: Insufficient documentation

## 2010-11-24 HISTORY — DX: Anemia, unspecified: D64.9

## 2010-11-24 LAB — URINALYSIS, ROUTINE W REFLEX MICROSCOPIC
Bilirubin Urine: NEGATIVE
Hgb urine dipstick: NEGATIVE
Ketones, ur: NEGATIVE mg/dL
Protein, ur: NEGATIVE mg/dL
Urobilinogen, UA: 0.2 mg/dL (ref 0.0–1.0)

## 2010-11-24 MED ORDER — SODIUM CHLORIDE 0.9 % IV BOLUS (SEPSIS)
1000.0000 mL | Freq: Once | INTRAVENOUS | Status: AC
  Administered 2010-11-24: 1000 mL via INTRAVENOUS

## 2010-11-24 MED ORDER — ONDANSETRON HCL 4 MG/2ML IJ SOLN
4.0000 mg | Freq: Once | INTRAMUSCULAR | Status: AC
  Administered 2010-11-24: 4 mg via INTRAVENOUS
  Filled 2010-11-24: qty 2

## 2010-11-24 NOTE — ED Notes (Signed)
Headache, dizziness that started today , pt is [redacted] weeks pregnant, due date, jan 22, 68, is followed by [facility]

## 2010-11-24 NOTE — ED Notes (Addendum)
Pt is only [redacted] weeks pregnant. Fetal monitoring done by [facility] if pt is atleast 23 weeks. EDP notified.

## 2010-11-24 NOTE — ED Provider Notes (Signed)
Scribed for Donnetta Hutching, MD, the patient was seen in room APA19/APA19 . This chart was scribed by Ellie Lunch. This patient's care was started at 8:23 PM.   CSN: [id number] Arrival date & time: 11/24/2010  8:04 PM  Chief Complaint  Patient presents with  . Headache    started today and is [redacted] weeks pregnant  . Dizziness   HPI Kimberly Olsen is a 20 y.o. female who is [redacted] weeks pregnant presents to the Emergency Department complaining of headache and dizziness starting today. Pt was at work when her headache started. Pt c/o associated dizziness, lightheadedness, and blurry vision. Pain is rated 9/10 in severity. Pt states her appetite has been decreased and she has not eaten or drank much today. Her abdomin is also sore and baby has been more active today. Denies vaginal bleeding, vaginal discharge and contractions. Reports no improvement since arrival. There are no other associated symptoms and no other alleviating or aggravating factors.  First pregnancy. [facility] Apr 09 2011.  Past Medical History  Diagnosis Date  . Pregnant state, incidental   . Anemia     Past Surgical History  Procedure Date  . Oophorectomy    MEDICATIONS:  Previous Medications   PRENAT W/O A-FE-DSS-METHFOL-FA (PRENATAL MULTIVITAMIN) 90-600-400 MG-MCG-MCG TABLET    Take 1 tablet by mouth daily.     PROMETHAZINE (PHENERGAN) 25 MG TABLET    Take 25 mg by mouth every 6 (six) hours as needed. Nausea/vomitting     ALLERGIES:  Allergies as of 11/24/2010  . (Not on File)    No family history on file.  History  Substance Use Topics  . Smoking status: Never Smoker   . Smokeless tobacco: Not on file  . Alcohol Use: No  accompanied to ED by father [employer] at [employer]  OB History    Grav Para Term Preterm Abortions TAB SAB Ect Mult Living   1               Review of Systems  Constitutional: Positive for appetite change (decreased).  Eyes: Positive for visual disturbance (blurry).  Gastrointestinal: Positive for  nausea and abdominal pain. Negative for vomiting.  Genitourinary: Negative for vaginal bleeding and vaginal discharge.  Neurological: Positive for dizziness, light-headedness and headaches.  All other systems reviewed and are negative.    Physical Exam  BP 104/51  Pulse 86  Temp(Src) 97.7 F (36.5 C) (Oral)  Resp 22  Ht 5\' 7"  (1.702 m)  Wt 174 lb (78.926 kg)  BMI 27.25 kg/m2  SpO2 100%  LMP 07/16/2010  Physical Exam  Nursing note and vitals reviewed. Constitutional: She is oriented to person, place, and time. She appears well-developed and well-nourished.  HENT:  Head: Normocephalic and atraumatic.       Dry mucous membranes  Eyes: Conjunctivae and EOM are normal. Pupils are equal, round, and reactive to light.  Neck: Normal range of motion. Neck supple.  Cardiovascular: Normal rate, regular rhythm and normal heart sounds.   Pulmonary/Chest: Effort normal and breath sounds normal.  Abdominal: Soft. There is no tenderness.       Abdomen is gravid.   Musculoskeletal: Normal range of motion.  Neurological: She is alert and oriented to person, place, and time.  Skin: Skin is warm and dry.  Psychiatric: She has a normal mood and affect.    Procedures OTHER DATA REVIEWED: Nursing notes, vital signs, and past medical records reviewed.  DIAGNOSTIC STUDIES: Oxygen Saturation is 100% on room air, normal by my  interpretation.    LABS / RADIOLOGY:    ED COURSE / COORDINATION OF CARE:   MDM:  Discussed diagnostic possibilities including dehydration and treatment plan with patient and father. Order IV fluids and fetal monitoring. Discharge likely.   IMPRESSION: Diagnoses that have been ruled out:  Diagnoses that are still under consideration:  Final diagnoses:    MEDICATIONS GIVEN IN THE E.D.  Medications  promethazine (PHENERGAN) 25 MG tablet (not administered)  sodium chloride 0.9 % bolus 1,000 mL (1000 mL Intravenous Given 11/24/10 2135)  ondansetron (ZOFRAN)  injection 4 mg (4 mg Intravenous Given 11/24/10 2135)   SCRIBE ATTESTATION: I personally performed the services described in this documentation, which was scribed in my presence. The recorded information has been reviewed and considered. Donnetta Hutching, MD   Recheck prior to discharge. Patient feels much better after IV fluids. Good fetal movement.      Donnetta Hutching, MD 11/24/10 2257

## 2010-11-24 NOTE — ED Notes (Signed)
edp in with pt 

## 2010-11-24 NOTE — ED Notes (Signed)
Pt hungry, edp advised that pt could eat whatever she wanted.

## 2010-12-07 LAB — URINALYSIS, ROUTINE W REFLEX MICROSCOPIC
Bilirubin Urine: NEGATIVE
Glucose, UA: NEGATIVE
Ketones, ur: NEGATIVE
pH: 7

## 2010-12-07 LAB — CBC
HCT: 37.3
Hemoglobin: 12.8
Platelets: 367
WBC: 14.1 — ABNORMAL HIGH

## 2010-12-07 LAB — DIFFERENTIAL
Band Neutrophils: 0
Eosinophils Relative: 3
Lymphocytes Relative: 23
Metamyelocytes Relative: 0
Monocytes Relative: 4
nRBC: 0

## 2010-12-07 LAB — URINE MICROSCOPIC-ADD ON

## 2010-12-07 LAB — BASIC METABOLIC PANEL
GFR calc non Af Amer: >60
Potassium: 3.9
Sodium: 136

## 2011-03-19 NOTE — L&D Delivery Note (Cosign Needed)
Delivery Note At 3:37 PM a viable female was delivered via Vaginal, Spontaneous Delivery (Presentation: Left Occiput Anterior).  APGAR: , ; weight 7 lb 7.9 oz (3400 g).   Placenta status: Intact, Spontaneous.  Cord: 3 vessels with the following complications: nuchal cord x 1 and tight body cord- somersaulted through.    Anesthesia: Epidural  Episiotomy: n/a Lacerations: 1st degree;Labial Rt Suture Repair: 3.0 vicryl Est. Blood Loss (mL):  Mom to postpartum.  Baby to nursery-stable.  Plans to breast & bottlefeed.  Thinking about Mirena for contraception.  Dorathy Kinsman, CNM in attendance of birth.  Joellyn Haff, SNM 04/11/2011, 4:04 PM

## 2011-03-31 ENCOUNTER — Inpatient Hospital Stay (HOSPITAL_COMMUNITY)
Admission: AD | Admit: 2011-03-31 | Discharge: 2011-03-31 | Disposition: A | Discharge status: Home / Self Care | Payer: Medicaid Other | Source: Ambulatory Visit | Attending: Obstetrics and Gynecology | Admitting: Obstetrics and Gynecology

## 2011-03-31 ENCOUNTER — Encounter (HOSPITAL_COMMUNITY): Payer: Self-pay | Admitting: *Deleted

## 2011-03-31 DIAGNOSIS — M545 Low back pain, unspecified: Secondary | ICD-10-CM | POA: Insufficient documentation

## 2011-03-31 DIAGNOSIS — O99891 Other specified diseases and conditions complicating pregnancy: Secondary | ICD-10-CM | POA: Insufficient documentation

## 2011-03-31 DIAGNOSIS — N898 Other specified noninflammatory disorders of vagina: Secondary | ICD-10-CM

## 2011-03-31 HISTORY — DX: Anxiety disorder, unspecified: F41.9

## 2011-03-31 HISTORY — DX: Major depressive disorder, single episode, unspecified: F32.9

## 2011-03-31 HISTORY — DX: Depression, unspecified: F32.A

## 2011-03-31 LAB — AMNISURE RUPTURE OF MEMBRANE (ROM) NOT AT ARMC: Amnisure ROM: NEGATIVE

## 2011-03-31 NOTE — Progress Notes (Signed)
Pt had a large gush of fluid last night. Unable to sleep could not get comfortable.Having back pain and stomach cramps. Reports still having some fluid leaking out

## 2011-03-31 NOTE — Progress Notes (Signed)
Pt reports gush of clear fluid  @ 0400 and trickling ever since. Pt reports + fm and denies vaginal bleeding.

## 2011-03-31 NOTE — ED Provider Notes (Addendum)
History     Chief Complaint  Patient presents with  . Rupture of Membranes   HPI This is a 21 yo G1P0 at 38 weeks and 5 days.  Felt gush of fluid last night around 4pm.  Continued to have some leaking fluid.  Admits to constant, non-radiating lower back pain, which has lessened this AM, but has continued.  Denies contractions, decreased fetal activity, vaginal bleeding, vaginal discharge.  OB History    Grav Para Term Preterm Abortions TAB SAB Ect Mult Living   1               Past Medical History  Diagnosis Date  . Pregnant state, incidental   . Anemia   . Anxiety   . Depression     Past Surgical History  Procedure Date  . Oophorectomy     Family History  Problem Relation Age of Onset  . Anesthesia problems Neg Hx   . Hypotension Neg Hx   . Malignant hyperthermia Neg Hx   . Pseudochol deficiency Neg Hx     History  Substance Use Topics  . Smoking status: Never Smoker   . Smokeless tobacco: Not on file  . Alcohol Use: No    Allergies:  Allergies  Allergen Reactions  . Coconut Fatty Acids Hives  . Sulfa Antibiotics Hives and Rash    Prescriptions prior to admission  Medication Sig Dispense Refill  . omeprazole (PRILOSEC) 20 MG capsule Take 20 mg by mouth daily.      Burnis Medin w/o A-FE-DSS-Methfol-FA (PRENATAL MULTIVITAMIN) 90-600-400 MG-MCG-MCG tablet Take 1 tablet by mouth daily.        . promethazine (PHENERGAN) 25 MG tablet Take 25 mg by mouth every 6 (six) hours as needed. Nausea/vomitting         Review of Systems  All other systems reviewed and are negative.   Physical Exam   Blood pressure 127/76, pulse 85, temperature 98.7 F (37.1 C), temperature source Oral, resp. rate 18, height 5' 7.5" (1.715 m), weight 86.818 kg (191 lb 6.4 oz), last menstrual period 07/16/2010.  Physical Exam  Constitutional: She is oriented to person, place, and time. She appears well-developed and well-nourished.  HENT:  Head: Normocephalic.  Respiratory: Effort  normal.  GI: Soft. Bowel sounds are normal. She exhibits no distension and no mass. There is no tenderness. There is no rebound and no guarding.       Fundus at term  Genitourinary: Vagina normal.       White physiologic discharge.  Musculoskeletal:       Point tenderness to lumbosacral junction  Neurological: She is alert and oriented to person, place, and time.  Skin: Skin is warm and dry. No rash noted. No erythema. No pallor.   Dilation: 1 Effacement (%): 20 Station: -3 Exam by:: Stinson  Ferning - neg Pooling - neg Amnisure - neg  NST - category 1 tracing with baseline 140-150s.  MAU Course  Procedures  Assessment and Plan  1.  IUP 2.  Vaginal discharge  Normal discharge.  Will send patient home.  Follow up at next appointment.    STINSON, JACOB JEHIEL 03/31/2011, 3:53 PM

## 2011-04-11 ENCOUNTER — Encounter (HOSPITAL_COMMUNITY): Payer: Self-pay | Admitting: Anesthesiology

## 2011-04-11 ENCOUNTER — Encounter (HOSPITAL_COMMUNITY): Payer: Self-pay | Admitting: *Deleted

## 2011-04-11 ENCOUNTER — Inpatient Hospital Stay (HOSPITAL_COMMUNITY)
Admission: AD | Admit: 2011-04-11 | Discharge: 2011-04-13 | DRG: 775 | Disposition: A | Discharge status: Home / Self Care | Payer: Medicaid Other | Source: Ambulatory Visit | Attending: Obstetrics & Gynecology | Admitting: Obstetrics & Gynecology

## 2011-04-11 ENCOUNTER — Inpatient Hospital Stay (HOSPITAL_COMMUNITY): Payer: Medicaid Other | Admitting: Anesthesiology

## 2011-04-11 LAB — ANTIBODY SCREEN: Antibody Screen: NEGATIVE

## 2011-04-11 LAB — GC/CHLAMYDIA PROBE AMP, GENITAL
Chlamydia: NEGATIVE
Gonorrhea: NEGATIVE

## 2011-04-11 LAB — RAPID URINE DRUG SCREEN, HOSP PERFORMED
Barbiturates: NOT DETECTED
Cocaine: NOT DETECTED

## 2011-04-11 LAB — CBC
HCT: 36.5 % (ref 36.0–46.0)
Hemoglobin: 12.6 g/dL (ref 12.0–15.0)
MCHC: 34.5 g/dL (ref 30.0–36.0)

## 2011-04-11 LAB — HIV ANTIBODY (ROUTINE TESTING W REFLEX): HIV: NONREACTIVE

## 2011-04-11 MED ORDER — OXYCODONE-ACETAMINOPHEN 5-325 MG PO TABS
1.0000 | ORAL_TABLET | ORAL | Status: DC | PRN
  Administered 2011-04-11 – 2011-04-12 (×2): 1 via ORAL
  Administered 2011-04-12: 2 via ORAL
  Administered 2011-04-12 – 2011-04-13 (×3): 1 via ORAL
  Filled 2011-04-11 (×3): qty 1
  Filled 2011-04-11: qty 2
  Filled 2011-04-11 (×2): qty 1

## 2011-04-11 MED ORDER — SIMETHICONE 80 MG PO CHEW: 80.0000 mg | CHEWABLE_TABLET | ORAL | Status: DC | PRN

## 2011-04-11 MED ORDER — ZOLPIDEM TARTRATE 5 MG PO TABS: 5.0000 mg | ORAL_TABLET | Freq: Every evening | ORAL | Status: DC | PRN

## 2011-04-11 MED ORDER — WITCH HAZEL-GLYCERIN EX PADS: 1.0000 "application " | MEDICATED_PAD | CUTANEOUS | Status: DC | PRN

## 2011-04-11 MED ORDER — EPHEDRINE 5 MG/ML INJ: 10.0000 mg | INTRAVENOUS | Status: DC | PRN

## 2011-04-11 MED ORDER — ACETAMINOPHEN 325 MG PO TABS: 650.0000 mg | ORAL_TABLET | ORAL | Status: DC | PRN

## 2011-04-11 MED ORDER — SENNOSIDES-DOCUSATE SODIUM 8.6-50 MG PO TABS
2.0000 | ORAL_TABLET | Freq: Every day | ORAL | Status: DC
  Administered 2011-04-11 – 2011-04-12 (×2): 2 via ORAL

## 2011-04-11 MED ORDER — NALBUPHINE SYRINGE 5 MG/0.5 ML
10.0000 mg | INJECTION | Freq: Once | INTRAMUSCULAR | Status: AC
  Administered 2011-04-11: 10 mg via INTRAVENOUS
  Filled 2011-04-11: qty 1

## 2011-04-11 MED ORDER — LIDOCAINE HCL (PF) 1 % IJ SOLN
30.0000 mL | INTRAMUSCULAR | Status: DC | PRN
  Filled 2011-04-11: qty 30

## 2011-04-11 MED ORDER — DIPHENHYDRAMINE HCL 50 MG/ML IJ SOLN: 12.5000 mg | INTRAMUSCULAR | Status: DC | PRN

## 2011-04-11 MED ORDER — ONDANSETRON HCL 4 MG/2ML IJ SOLN: 4.0000 mg | Freq: Four times a day (QID) | INTRAMUSCULAR | Status: DC | PRN

## 2011-04-11 MED ORDER — DIPHENHYDRAMINE HCL 25 MG PO CAPS: 25.0000 mg | ORAL_CAPSULE | Freq: Four times a day (QID) | ORAL | Status: DC | PRN

## 2011-04-11 MED ORDER — TETANUS-DIPHTH-ACELL PERTUSSIS 5-2.5-18.5 LF-MCG/0.5 IM SUSP: 0.5000 mL | Freq: Once | INTRAMUSCULAR | Status: DC

## 2011-04-11 MED ORDER — NALBUPHINE SYRINGE 5 MG/0.5 ML
10.0000 mg | INJECTION | Freq: Once | INTRAMUSCULAR | Status: AC
  Administered 2011-04-11: 10 mg via INTRAMUSCULAR
  Filled 2011-04-11 (×2): qty 1

## 2011-04-11 MED ORDER — ONDANSETRON HCL 4 MG PO TABS: 4.0000 mg | ORAL_TABLET | ORAL | Status: DC | PRN

## 2011-04-11 MED ORDER — OXYTOCIN 20 UNITS IN LACTATED RINGERS INFUSION - SIMPLE
125.0000 mL/h | Freq: Once | INTRAVENOUS | Status: AC
  Administered 2011-04-11: 125 mL/h via INTRAVENOUS

## 2011-04-11 MED ORDER — PHENYLEPHRINE 40 MCG/ML (10ML) SYRINGE FOR IV PUSH (FOR BLOOD PRESSURE SUPPORT): 80.0000 ug | PREFILLED_SYRINGE | INTRAVENOUS | Status: DC | PRN

## 2011-04-11 MED ORDER — BENZOCAINE-MENTHOL 20-0.5 % EX AERO: 1.0000 "application " | INHALATION_SPRAY | CUTANEOUS | Status: DC | PRN

## 2011-04-11 MED ORDER — IBUPROFEN 600 MG PO TABS
600.0000 mg | ORAL_TABLET | Freq: Four times a day (QID) | ORAL | Status: DC
  Administered 2011-04-11 – 2011-04-13 (×7): 600 mg via ORAL
  Filled 2011-04-11 (×7): qty 1

## 2011-04-11 MED ORDER — OXYCODONE-ACETAMINOPHEN 5-325 MG PO TABS: 2.0000 | ORAL_TABLET | ORAL | Status: DC | PRN

## 2011-04-11 MED ORDER — LANOLIN HYDROUS EX OINT: TOPICAL_OINTMENT | CUTANEOUS | Status: DC | PRN

## 2011-04-11 MED ORDER — HYDROXYZINE HCL 50 MG/ML IM SOLN
50.0000 mg | Freq: Once | INTRAMUSCULAR | Status: AC
  Administered 2011-04-11: 50 mg via INTRAMUSCULAR
  Filled 2011-04-11: qty 1

## 2011-04-11 MED ORDER — LACTATED RINGERS IV SOLN
500.0000 mL | Freq: Once | INTRAVENOUS | Status: AC
  Administered 2011-04-11: 500 mL via INTRAVENOUS

## 2011-04-11 MED ORDER — IBUPROFEN 600 MG PO TABS: 600.0000 mg | ORAL_TABLET | Freq: Four times a day (QID) | ORAL | Status: DC | PRN

## 2011-04-11 MED ORDER — SODIUM BICARBONATE 8.4 % IV SOLN
INTRAVENOUS | Status: DC | PRN
  Administered 2011-04-11: 4 mL via EPIDURAL

## 2011-04-11 MED ORDER — LACTATED RINGERS IV SOLN: INTRAVENOUS | Status: DC

## 2011-04-11 MED ORDER — OXYTOCIN 20 UNITS IN LACTATED RINGERS INFUSION - SIMPLE
1.0000 m[IU]/min | INTRAVENOUS | Status: DC
  Administered 2011-04-11: 2 m[IU]/min via INTRAVENOUS
  Filled 2011-04-11: qty 1000

## 2011-04-11 MED ORDER — DIBUCAINE 1 % RE OINT: 1.0000 "application " | TOPICAL_OINTMENT | RECTAL | Status: DC | PRN

## 2011-04-11 MED ORDER — PHENYLEPHRINE 40 MCG/ML (10ML) SYRINGE FOR IV PUSH (FOR BLOOD PRESSURE SUPPORT)
80.0000 ug | PREFILLED_SYRINGE | INTRAVENOUS | Status: DC | PRN
  Filled 2011-04-11: qty 5

## 2011-04-11 MED ORDER — ONDANSETRON HCL 4 MG/2ML IJ SOLN: 4.0000 mg | INTRAMUSCULAR | Status: DC | PRN

## 2011-04-11 MED ORDER — FENTANYL 2.5 MCG/ML BUPIVACAINE 1/10 % EPIDURAL INFUSION (WH - ANES)
INTRAMUSCULAR | Status: DC | PRN
  Administered 2011-04-11: 13 mL/h via EPIDURAL

## 2011-04-11 MED ORDER — EPHEDRINE 5 MG/ML INJ
10.0000 mg | INTRAVENOUS | Status: DC | PRN
  Filled 2011-04-11: qty 4

## 2011-04-11 MED ORDER — PRENATAL MULTIVITAMIN CH
1.0000 | ORAL_TABLET | Freq: Every day | ORAL | Status: DC
  Administered 2011-04-11 – 2011-04-13 (×3): 1 via ORAL
  Filled 2011-04-11 (×3): qty 1

## 2011-04-11 MED ORDER — CITRIC ACID-SODIUM CITRATE 334-500 MG/5ML PO SOLN
30.0000 mL | ORAL | Status: DC | PRN
  Filled 2011-04-11: qty 15

## 2011-04-11 MED ORDER — LACTATED RINGERS IV SOLN
500.0000 mL | INTRAVENOUS | Status: DC | PRN
  Administered 2011-04-11: 500 mL via INTRAVENOUS
  Administered 2011-04-11: 1000 mL via INTRAVENOUS

## 2011-04-11 MED ORDER — OXYTOCIN BOLUS FROM INFUSION
500.0000 mL | Freq: Once | INTRAVENOUS | Status: DC
  Filled 2011-04-11: qty 500

## 2011-04-11 MED ORDER — FLEET ENEMA 7-19 GM/118ML RE ENEM: 1.0000 | ENEMA | RECTAL | Status: DC | PRN

## 2011-04-11 MED ORDER — TERBUTALINE SULFATE 1 MG/ML IJ SOLN: 0.2500 mg | Freq: Once | INTRAMUSCULAR | Status: DC | PRN

## 2011-04-11 MED ORDER — FENTANYL 2.5 MCG/ML BUPIVACAINE 1/10 % EPIDURAL INFUSION (WH - ANES)
14.0000 mL/h | INTRAMUSCULAR | Status: DC
  Administered 2011-04-11: 14 mL/h via EPIDURAL
  Filled 2011-04-11 (×2): qty 60

## 2011-04-11 NOTE — Progress Notes (Signed)
I was present for the exam and agree with above.  Kimberly Olsen 04/11/2011 6:45 PM

## 2011-04-11 NOTE — Progress Notes (Signed)
Pt G1 at 40.2wks contracting since 2300.

## 2011-04-11 NOTE — Anesthesia Preprocedure Evaluation (Signed)
Anesthesia Evaluation  Patient identified by MRN, date of birth, ID band Patient awake    Reviewed: Allergy & Precautions, H&P , Patient's Chart, lab work & pertinent test results  Airway Mallampati: II TM Distance: >3 FB Neck ROM: full    Dental No notable dental hx.    Pulmonary  clear to auscultation  Pulmonary exam normal       Cardiovascular Exercise Tolerance: Good regular Normal    Neuro/Psych    GI/Hepatic   Endo/Other    Renal/GU      Musculoskeletal   Abdominal   Peds  Hematology   Anesthesia Other Findings   Reproductive/Obstetrics                           Anesthesia Physical Anesthesia Plan  ASA: II  Anesthesia Plan: Epidural   Post-op Pain Management:    Induction:   Airway Management Planned:   Additional Equipment:   Intra-op Plan:   Post-operative Plan:   Informed Consent: I have reviewed the patients History and Physical, chart, labs and discussed the procedure including the risks, benefits and alternatives for the proposed anesthesia with the patient or authorized representative who has indicated his/her understanding and acceptance.   Dental Advisory Given  Plan Discussed with:   Anesthesia Plan Comments: (Labs checked- platelets confirmed with RN in room. Fetal heart tracing, per RN, reported to be stable enough for sitting procedure. Discussed epidural, and patient consents to the procedure:  included risk of possible headache,backache, failed block, allergic reaction, and nerve injury. This patient was asked if she had any questions or concerns before the procedure started. )        Anesthesia Quick Evaluation

## 2011-04-11 NOTE — H&P (Signed)
Kimberly Olsen is a 21 y.o. female presenting for painful contractions since 2:00am and SROM in MAU. No bleeding. No headache, visual disturbances or edema. Pt was increased risk for Trisomy 18 (1:30). Harmony test declined by pt. U/S markers negative for trisomy 18. EFW 48% tile done due to large femur measurements.  Maternal Medical History:  Reason for admission: Reason for Admission:   nausea  OB History    Grav Para Term Preterm Abortions TAB SAB Ect Mult Living   1              Past Medical History  Diagnosis Date  . Pregnant state, incidental   . Anemia   . Anxiety   . Depression    Past Surgical History  Procedure Date  . Oophorectomy    Family History: family history is negative for Anesthesia problems, and Hypotension, and Malignant hyperthermia, and Pseudochol deficiency, . Social History:  reports that she has never smoked. She does not have any smokeless tobacco history on file. She reports that she uses illicit drugs (Marijuana). She reports that she does not drink alcohol.  Review of Systems  Constitutional: Negative.   HENT: Negative.   Eyes: Negative.  Negative for blurred vision.  Respiratory: Negative.   Cardiovascular: Negative.   Gastrointestinal: Positive for abdominal pain. Negative for nausea.  Genitourinary: Negative.   Musculoskeletal: Negative.   Skin: Negative.   Neurological: Negative.  Negative for headaches.    Dilation: 4.5 Effacement (%): 80 Station: -2 Exam by:: Rudi Coco RN Blood pressure 131/79, pulse 79, temperature 98.3 F (36.8 C), temperature source Oral, resp. rate 20, height 5' 7.5" (1.715 m), weight 90.175 kg (198 lb 12.8 oz), last menstrual period 07/16/2010. Exam Physical Exam  Constitutional: She is oriented to person, place, and time. She appears distressed.       Due to painful contractions.  Eyes: Conjunctivae are normal.  Neck: Neck supple.  Cardiovascular: Normal rate, regular rhythm and normal heart sounds.   No  murmur heard. Respiratory: Effort normal and breath sounds normal. No respiratory distress.  GI: Bowel sounds are normal.       Normal abdominal physical exam of 40 w gravid pt.  Musculoskeletal: She exhibits no edema.  Neurological: She is alert and oriented to person, place, and time. She has normal reflexes.    Prenatal labs: ABO, Rh:  O positive Antibody:  neg Rubella:  inmmune RPR:   neg HBsAg:   neg HIV:   neg GBS:  negative  2 hGTT: 69/141/79 UDS + for THC in the past  Assessment/Plan: Assessment: 1. Labor: active phase with SROM at 5:44 am 2. Fetal Wellbeing: Category 1  3. Pain Control: wants epidural 4. GBS: negative 5. 40.2 week IUP  Plan:  1. Admit to BS  2. Routine L&D orders 3. Analgesia/anesthesia PRN      D. Piloto [employer]. MD PGY-1 04/11/2011, 6:15 AM

## 2011-04-11 NOTE — Progress Notes (Signed)
Kimberly Olsen is a 21 y.o. G1P0 at [redacted]w[redacted]d admitted for rupture of membranes  Subjective: Comfortable w/ epidural.  Able to void on bedpan.  No complaints.  Objective: BP 118/79   Pulse 73   Temp(Src) 97.9 F (36.6 C) (Oral)   Resp 20   Ht 5' 7.5" (1.715 m)   Wt 90.175 kg (198 lb 12.8 oz)   BMI 30.68 kg/m2   SpO2 100%   LMP 07/16/2010      FHT:  FHR: 140 bpm, variability: moderate,  accelerations:  Present,  decelerations:  Present occasional variables UC:   regular, every 2-5 minutes SVE:   Dilation: 7 Effacement (%): 80 Station: -1 Exam by:: felkelrn  Labs: Lab Results  Component Value Date   WBC 15.8* 04/11/2011   HGB 12.6 04/11/2011   HCT 36.5 04/11/2011   MCV 88.0 04/11/2011   PLT 244 04/11/2011    Assessment / Plan: Protracted active phase  Labor: protraced active phase- will begin pitocin per low-dose protocol Preeclampsia:  n/a Fetal Wellbeing:  Category II Pain Control:  Epidural I/D:  n/a Anticipated MOD:  NSVD  Continue to empty bladder periodically  Reviewed POC w/ Dorathy Kinsman, CNM  Joellyn Haff, SNM 04/11/2011, 1:11 PM

## 2011-04-11 NOTE — Progress Notes (Signed)
Kimberly Olsen is a 21 y.o. G1P0 at [redacted]w[redacted]d admitted for rupture of membranes  Subjective: Just received epidural- very comfortable.  Drowsy from nubain/vistaril received earlier.  No complaints.  Does not want urinary catheter.  Objective: BP 115/71   Pulse 77   Temp(Src) 97.9 F (36.6 C) (Oral)   Resp 18   Ht 5' 7.5" (1.715 m)   Wt 90.175 kg (198 lb 12.8 oz)   BMI 30.68 kg/m2   SpO2 100%   LMP 07/16/2010      FHT:  FHR: 125 bpm, variability: moderate,  accelerations:  Present,  decelerations:  Absent UC:   regular, every 2-5 minutes SVE:   Dilation: 6 Effacement (%): 80 Station: -1 Exam by:: K.Booker,CNMS  Labs: Lab Results  Component Value Date   WBC 15.8* 04/11/2011   HGB 12.6 04/11/2011   HCT 36.5 04/11/2011   MCV 88.0 04/11/2011   PLT 244 04/11/2011    Assessment / Plan: Spontaneous labor, progressing normally  Labor: Progressing normally, will consider pitocin augmentation if needed Preeclampsia:  n/a Fetal Wellbeing:  Category I Pain Control:  Epidural I/D:  n/a Anticipated MOD:  NSVD  Assess for urinary retention- I&O cath as necessary  Joellyn Haff, SNM 04/11/2011, 10:24 AM

## 2011-04-11 NOTE — Anesthesia Procedure Notes (Signed)

## 2011-04-11 NOTE — Progress Notes (Signed)
GLAYDS INSCO is a 21 y.o. G1P0 at [redacted]w[redacted]d admitted for rupture of membranes.  Called to room by RN who states had to turn off pitocin d/t variables.  Subjective: Comfortable w/ epidural.  No complaints.  Objective: BP 115/68   Pulse 102   Temp(Src) 97.9 F (36.6 C) (Oral)   Resp 18   Ht 5' 7.5" (1.715 m)   Wt 90.175 kg (198 lb 12.8 oz)   BMI 30.68 kg/m2   SpO2 99%   LMP 07/16/2010      FHT:  FHR: 135 bpm, variability: mostly moderate, some periods of minimal,  accelerations:  Present,  decelerations:  Present variables UC:   regular, every 2-5 minutes, not tracing well at times d/t maternal position changes SVE:   Dilation: 8 Effacement (%): 80 Station: 0 Exam by:: booker,CNMS  Labs: Lab Results  Component Value Date   WBC 15.8* 04/11/2011   HGB 12.6 04/11/2011   HCT 36.5 04/11/2011   MCV 88.0 04/11/2011   PLT 244 04/11/2011    Assessment / Plan: Active phase labor- pitocin off d/t variables- now 8cm  Labor: Protracted active phase- pitocin currently off Preeclampsia:  n/a Fetal Wellbeing:  Category II Pain Control:  Epidural I/D:  n/a Anticipated MOD:  NSVD  Reviewed w/ pt possibility of placing iupc and doing amnioinfusion if variables continue.  Will reassess shortly to see if this is indicated.  Dorathy Kinsman, CNM present in room   [employer], [place] 04/11/2011, 2:09 PM

## 2011-04-11 NOTE — Progress Notes (Signed)
I agree with above.   Kimberly Olsen 04/11/2011 6:45 PM

## 2011-04-11 NOTE — Progress Notes (Signed)
I was present for the exam and agree with above.  Dorathy Kinsman 04/11/2011 3:08 PM

## 2011-04-12 NOTE — Progress Notes (Signed)
PSYCHOSOCIAL ASSESSMENT ~ MATERNAL/CHILD  Name: Mason Siska Age: 21  Referral Date: 04/12/11  Reason/Source: Social situation/ CN  I. FAMILY/HOME ENVIRONMENT  A. Child's Legal Guardian _X__Parent(s) ___Grandparent ___Foster parent ___DSS_________________  Name: Sinai Margensey DOB: // Age: 23  Address: [address] ; [place], [place] [address]  Name: Miguel Fors DOB: // Age: 23  Address: (same as above)  B. Other Household Members/Support Persons Name: Jeff Margensey Relationship: dad DOB ___/___/___  Name: Relationship: DOB ___/___/___  Name: Relationship: DOB ___/___/___  Name: Relationship: DOB ___/___/___  C. Other Support:  II. PSYCHOSOCIAL DATA A. Information Source _X_Patient Interview __Family Interview __Other___________ B. Financial and Community Resources __Employment:  _X_Medicaid County: [place] __Private Insurance: __Self Pay  _X_Food Stamps _X_WIC __Work First __Public Housing __Section 8  __Maternity Care Coordination/Child Service Coordination/Early Intervention  ___School: Grade:  __Other:  C. Cultural and Environment Information Cultural Issues Impacting Care:  III. STRENGTHS _X__Supportive family/friends  _X__Adequate Resources  ___Compliance with medical plan  _X__Home prepared for Child (including basic supplies)  ___Understanding of illness  ___Other:  RISK FACTORS AND CURRENT PROBLEMS ____No Problems Noted  History of substance use  History of SI  IV. SOCIAL WORK ASSESSMENT Sw met with pt to assess her current social situation re: history of substance use and SI. Pt admits to smoking MJ, "daily" prior to pregnancy confirmation at 7 weeks. Pt explained that she quit smoking when she learned but eventually started to smoke again to help with "morning sickness, body pain" and sleeplessness. Pt told Sw that she smoked "a few times" during the pregnancy, with last reported use in November 2012. She denies any cocaine use since 2011. Pt told Sw that she never  really used cocaine (last use 2011), as she admitted to pills (pain pills and Xanax) being her drug of choice. She has not used pills since 2011. Sw explained hospital drug testing policy. Pt did express concern about results and consequences re: CPS involvement. Sw explained normal CPS protocol, in an attempt to lessen her worries. Pt was admitted to [facility] in 2011, and contributes her depression symptoms to her drug addiction at that time. She was diagnosed with bipolar disorder and depression upon discharge and prescribed Wellbutrin and Abilify, of which she took " a few months." Pt stopped taking the medication due to a loss of insurance. Sw offered to arrange mental health follow for the pt, after discussing the risk of PP depression, however pt is not interested at this time. Pt does agree to contact her physician if depression symptoms arise. She denies current depression or SI. She reports feeling "wonderful and happiest" she has been in a long time. FOB is at the bedside asleep. She identified her father as her primary support person. She has supplies for the infant. Sw will follow up with drug screen results and make a referral if needed.  V. SOCIAL WORK PLAN _X__No Further Intervention Required/No Barriers to Discharge  ___Psychosocial Support and Ongoing Assessment of Needs  ___Patient/Family Education:  ___Child Protective Services Report County___________ Date___/____/____  ___Information/Referral to Community Resources_________________________  ___Other:    _X_Patient Interview  __Family Interview           __Other___________  B. Surveyor, [employer] and [employer] __Employment: _X_Medicaid    [place]: [place]                 __Private Insurance:                   __Self Pay  _X_Food Stamps   _X_WIC __Work First     __Public Housing     __Section 8    __Maternity Care Coordination/Child Service Coordination/Early Intervention   ___School:                                                                         Grade:  __Other:   Salena Saner Cultural and Environment Information Cultural Issues Impacting Care:  III. STRENGTHS _X__Supportive family/friends _X__Adequate Resources ___Compliance with medical plan _X__Home prepared for Child  (including basic supplies) ___Understanding of illness      ___Other: RISK FACTORS AND CURRENT PROBLEMS         ____No Problems Noted        History of substance use  History of SI                                                                                                                                                                                                                                               IV. SOCIAL WORK ASSESSMENT  Sw met with pt to assess her current social situation re: history of substance use and SI.  Pt admits to smoking MJ, "daily" prior to pregnancy confirmation at 7 weeks.  Pt explained that she quit smoking when she learned but eventually started to smoke again to help with "morning sickness, body pain" and sleeplessness.  Pt told Sw that she smoked "a few times" during the pregnancy, with last reported use in November 2012.  She denies any cocaine use since 2011.  Pt told Sw that she never really used cocaine (last use 2011), as she admitted to pills (pain pills and Xanax) being her drug of choice.  She  has not used pills since 2011.   Sw explained hospital drug testing policy. Pt did express concern about results and consequences re: CPS involvement.  Sw explained normal CPS protocol, in an attempt to lessen her worries.  Pt was admitted to [facility] in 2011, and contributes her depression symptoms to her drug addiction at that time.  She was diagnosed with bipolar disorder and depression upon discharge and prescribed Wellbutrin and Abilify, of which she took " a few months."  Pt stopped taking the medication due to a loss of insurance.  Sw offered to arrange mental health follow for the pt, after discussing the risk of PP depression, however pt is not interested at this time.  Pt does agree to contact her physician if depression symptoms arise.  She denies current depression or SI.  She reports feeling "wonderful and happiest" she has been in a long time.  FOB is at the  bedside asleep.  She identified her father as her primary support person.  She has supplies for the infant.  Sw will follow up with drug screen results and make a referral if needed.       V. SOCIAL WORK PLAN  _X__No Further Intervention Required/No Barriers to Discharge   ___Psychosocial Support and Ongoing Assessment of Needs   ___Patient/Family Education:   ___Child Protective Services Report   County___________ Date___/____/____   ___Information/Referral to [employer]_________________________   ___Other:

## 2011-04-12 NOTE — Progress Notes (Signed)
Post Partum Day 1 Subjective: no complaints, up ad lib, voiding, tolerating PO and + flatus Feeding initiated, going okay. Patient has no other complaints or concerns.  Denies shortness of breath, chest pain, leg pain/tenderness/swelling  Objective: Blood pressure 117/76, pulse 73, temperature 97.4 F (36.3 C), temperature source Oral, resp. rate 16, height 5' 7.5" (1.715 m), weight 198 lb 12.8 oz (90.175 kg), last menstrual period 07/16/2010, SpO2 97.00%, unknown if currently breastfeeding.  Physical Exam:  General: alert, cooperative, appears stated age and no distress Lochia: appropriate Uterine Fundus: firm DVT Evaluation: No evidence of DVT seen on physical exam. Negative Homan's sign. No cords or calf tenderness. No significant calf/ankle edema.   Basename 04/11/11 0620  HGB 12.6  HCT 36.5    Assessment/Plan: Plan for discharge tomorrow, Breastfeeding, Social Work consult and Contraception mirena SW consult for Hx of SI and drug abuse in past.  Lactation consult today.    LOS: 1 day   Cameron Proud 04/12/2011, 6:35 AM

## 2011-04-12 NOTE — Progress Notes (Signed)
UR chart review completed.  

## 2011-04-12 NOTE — Anesthesia Postprocedure Evaluation (Signed)
  Anesthesia Post-op Note  Patient: Kimberly Olsen  Procedure(s) Performed: * No procedures listed *  Patient Location: Mother/Baby  Anesthesia Type: Epidural  Level of Consciousness: alert  and oriented  Airway and Oxygen Therapy: Patient Spontanous Breathing  Post-op Pain: mild  Post-op Assessment: Patient's Cardiovascular Status Stable and Respiratory Function Stable  Post-op Vital Signs: stable  Complications: No apparent anesthesia complications

## 2011-04-13 NOTE — Progress Notes (Signed)
Post Partum Day 2 Subjective: no complaints, up ad lib, voiding, tolerating PO and + flatus Breast feeding going well.pain controlled.    Objective: Blood pressure 120/73, pulse 66, temperature 97.8 F (36.6 C), temperature source Oral, resp. rate 18, height 5' 7.5" (1.715 m), weight 198 lb 12.8 oz (90.175 kg), last menstrual period 07/16/2010, SpO2 96.00%, unknown if currently breastfeeding.  Physical Exam:  General: alert, cooperative, appears stated age and no distress Lochia: appropriate Uterine Fundus: firm Incision: healing well, no significant drainage, no dehiscence, no significant erythema DVT Evaluation: No evidence of DVT seen on physical exam. Negative Homan's sign. No cords or calf tenderness. No significant calf/ankle edema.   Basename 04/11/11 0620  HGB 12.6  HCT 36.5    Assessment/Plan: Discharge home and Contraception Mirena UDS negative Hx of depression,denies any signs/sx Pain controlled     LOS: 2 days   Kimberly Olsen 04/13/2011, 7:23 AM

## 2011-04-13 NOTE — Discharge Summary (Signed)
Attestation of Attending Supervision of Resident: Evaluation and management procedures were performed by the [facility] Resident under my supervision.  I have reviewed the resident's note, chart reviewed and agree with management and plan.  Jaynie Collins, M.D. 04/13/2011 9:59 AM

## 2011-04-13 NOTE — Discharge Summary (Signed)
Obstetric Discharge Summary Reason for Admission: onset of labor Prenatal Procedures: none Intrapartum Procedures: spontaneous vaginal delivery Postpartum Procedures: none Complications-Operative and Postpartum: labial laceration Hemoglobin  Date Value Range Status  04/11/2011 12.6  12.0-15.0 (g/dL) Final     HCT  Date Value Range Status  04/11/2011 36.5  36.0-46.0 (%) Final    Discharge Diagnoses: Term Pregnancy-delivered  Discharge Information: Date: 04/13/2011 Activity: unrestricted Diet: routine Medications: PNV and Ibuprofen Condition: stable Instructions: refer to practice specific booklet Discharge to: home   Newborn Data: Live born female  Birth Weight: 7 lb 7.9 oz (3399 g) APGAR: 8, 9  Home with mother.  Kimberly Olsen 04/13/2011, 8:27 AM

## 2011-05-12 ENCOUNTER — Emergency Department (HOSPITAL_COMMUNITY)
Admission: EM | Admit: 2011-05-12 | Discharge: 2011-05-12 | Disposition: A | Discharge status: Home / Self Care | Payer: Medicaid Other | Attending: Emergency Medicine | Admitting: Emergency Medicine

## 2011-05-12 ENCOUNTER — Emergency Department (HOSPITAL_COMMUNITY): Payer: Medicaid Other

## 2011-05-12 ENCOUNTER — Encounter (HOSPITAL_COMMUNITY): Payer: Self-pay

## 2011-05-12 DIAGNOSIS — R109 Unspecified abdominal pain: Secondary | ICD-10-CM | POA: Insufficient documentation

## 2011-05-12 LAB — CBC
MCHC: 34.3 g/dL (ref 30.0–36.0)
MCV: 87.4 fL (ref 78.0–100.0)
Platelets: 260 10*3/uL (ref 150–400)
RDW: 12.1 % (ref 11.5–15.5)
WBC: 7.7 10*3/uL (ref 4.0–10.5)

## 2011-05-12 LAB — URINALYSIS, ROUTINE W REFLEX MICROSCOPIC
Bilirubin Urine: NEGATIVE
Glucose, UA: NEGATIVE mg/dL
Ketones, ur: NEGATIVE mg/dL
pH: 7 (ref 5.0–8.0)

## 2011-05-12 LAB — DIFFERENTIAL
Basophils Absolute: 0 10*3/uL (ref 0.0–0.1)
Basophils Relative: 1 % (ref 0–1)
Eosinophils Absolute: 0.6 10*3/uL (ref 0.0–0.7)
Eosinophils Relative: 8 % — ABNORMAL HIGH (ref 0–5)
Lymphocytes Relative: 32 % (ref 12–46)

## 2011-05-12 LAB — URINE MICROSCOPIC-ADD ON

## 2011-05-12 MED ORDER — NAPROXEN 250 MG PO TABS: 250.0000 mg | ORAL_TABLET | Freq: Two times a day (BID) | ORAL | Status: AC

## 2011-05-12 MED ORDER — HYDROCODONE-ACETAMINOPHEN 5-325 MG PO TABS: ORAL_TABLET | ORAL | Status: AC

## 2011-05-12 MED ORDER — KETOROLAC TROMETHAMINE 60 MG/2ML IM SOLN
60.0000 mg | Freq: Once | INTRAMUSCULAR | Status: AC
Start: 2011-05-12 — End: 2011-05-12
  Administered 2011-05-12: 60 mg via INTRAMUSCULAR
  Filled 2011-05-12: qty 2

## 2011-05-12 MED ORDER — HYDROCODONE-ACETAMINOPHEN 5-325 MG PO TABS
1.0000 | ORAL_TABLET | Freq: Once | ORAL | Status: AC
  Administered 2011-05-12: 1 via ORAL
  Filled 2011-05-12: qty 1

## 2011-05-12 MED ORDER — CEPHALEXIN 500 MG PO CAPS: 500.0000 mg | ORAL_CAPSULE | Freq: Four times a day (QID) | ORAL | Status: AC

## 2011-05-12 NOTE — ED Notes (Signed)
Pt reports had vaginal delivery 4 weeks ago.  Says has been feeling well and had almost stopped bleeding.  Last night had sex and  Started having vaginal bleeding again.  Pt says has soaked 3 pads since 10am this morning.  C/o lower abd and lower abd pain.

## 2011-05-12 NOTE — Discharge Instructions (Signed)
RESOURCE GUIDE  Dental Problems  Patients with Medicaid: [facility]                     [facility] [address]                                           [address] Phone:  [phone]                                                  Phone:  [phone]  If unable to pay or uninsured, contact:  [facility] or [place] [facility] to become qualified for the adult dental clinic.  Chronic Pain Problems Contact [facility]  [phone] Patients need to be referred by their primary care doctor.  Insufficient Money for Medicine Contact United Way:  call "211" or [facility] 271-5999.  No Primary Care Doctor Call [facility]  832-8000 Other agencies that provide inexpensive medical care    [facility]  [phone]    [facility]  [phone]    [facility]  [phone]    Women's Clinic  [phone]    [facility]anned [facility]  [phone]    [facility] [facility]  [phone]  Psychological Services [facility]  [phone] [facility]  [phone] [place] [facility]   800 853-5163 (emergency services 641-4993)  Substance Abuse Resources [facility]  336-882-2125 [facility] 336-784-9470 [facility] 336-285-9073 [facility] 336-845-3988 Residential & Outpatient Substance Abuse Program  800-659-3381  Abuse/Neglect [place] Child Abuse Hotline (336) 641-3795 [place] Child Abuse Hotline 800-378-5315 (After Hours)  Emergency Shelter [facility] (336) 271-5985  Maternity Homes [facility] (336) 275-9566 [facility] (704) 372-4663  MRSA Hotline #:   832-7006    [place] Resources  [facility] [place]     [employer]                          [facility] [address] [place]                       [address]      [address]  [place]                                                [place]                            [place] Phone:  [phone]                                   Phone:  [phone]                 Phone:  [phone]  [facility] Phone:  [phone]    [facility] Child Abuse Hotline 8548729468 337-492-3317 (After Hours)   Take the prescriptions as directed.  Keep your appointment tomorrow at [facility] tomorrow as previously scheduled.  Ask if you can be seen by the OB/GYN provider for today's Emergency Department visit.  Return to the Emergency Department immediately sooner if worsening.

## 2011-05-12 NOTE — ED Notes (Signed)
Pt alert & oriented x4, stable gait. Pt given discharge instructions, paperwork & prescription(s). Patient instructed to stop at the registration desk to finish any additional paperwork. pt verbalized understanding. Pt left department w/ no further questions.  

## 2011-05-12 NOTE — ED Provider Notes (Signed)
History     CSN: [id number]  Arrival date & time 05/12/11  1547   First MD Initiated Contact with Patient 05/12/11 1615      Chief Complaint  Patient presents with  . Vaginal Bleeding    HPI Pt was seen at 1755.   Per pt, c/o gradual onset and persistence of constant vaginal bleeding since last night.  Has been assoc with lower abd "cramping" pain.  Pt is s/p NSVD on 04/11/2011 by Dr. Despina Hidden.  States she has been feeling well with decreased lochia and "my stitches were healing."  States her symptoms began after she had sex last night.  Pt is not breastfeeding.  Denies N/V/D, no fevers, no back/flank pain, no rash, no vaginal discharge.    OB/GYN:  Dr. Despina Hidden Past Medical History  Diagnosis Date  . Anemia   . Anxiety   . Depression     Past Surgical History  Procedure Date  . Oophorectomy     Family History  Problem Relation Age of Onset  . Anesthesia problems Neg Hx   . Hypotension Neg Hx   . Malignant hyperthermia Neg Hx   . Pseudochol deficiency Neg Hx     History  Substance Use Topics  . Smoking status: Never Smoker   . Smokeless tobacco: Not on file  . Alcohol Use: No    OB History    Grav Para Term Preterm Abortions TAB SAB Ect Mult Living   1 1 1       1       Review of Systems ROS: Statement: All systems negative except as marked or noted in the HPI; Constitutional: Negative for fever and chills. ; ; Eyes: Negative for eye pain, redness and discharge. ; ; ENMT: Negative for ear pain, hoarseness, nasal congestion, sinus pressure and sore throat. ; ; Cardiovascular: Negative for chest pain, palpitations, diaphoresis, dyspnea and peripheral edema. ; ; Respiratory: Negative for cough, wheezing and stridor. ; ; Gastrointestinal: Negative for nausea, vomiting, diarrhea, abdominal pain, blood in stool, hematemesis, jaundice and rectal bleeding. . ; ; Genitourinary: Negative for dysuria, flank pain and hematuria. ; GYN:  +vaginal bleeding, no vaginal discharge, no vulvar  pain.; Musculoskeletal: Negative for back pain and neck pain. Negative for swelling and trauma.; ; Skin: Negative for pruritus, rash, abrasions, blisters, bruising and skin lesion.; ; Neuro: Negative for headache, lightheadedness and neck stiffness. Negative for weakness, altered level of consciousness , altered mental status, extremity weakness, paresthesias, involuntary movement, seizure and syncope.     Allergies  Coconut fatty acids and Sulfa antibiotics  Home Medications   Current Outpatient Rx  Name Route Sig Dispense Refill  . PRENATAL MULTIVITAMIN CH Oral Take 1 tablet by mouth daily.    Marland Kitchen PROMETHAZINE HCL 25 MG PO TABS Oral Take 25 mg by mouth every 6 (six) hours as needed. Nausea/vomitting       BP 98/40  Pulse 75  Temp(Src) 98.6 F (37 C) (Oral)  Resp 20  Ht 5\' 7"  (1.702 m)  Wt 170 lb (77.111 kg)  BMI 26.63 kg/m2  SpO2 100%  Breastfeeding? Unknown  Physical Exam 1800: Physical examination:  Nursing notes reviewed; Vital signs and O2 SAT reviewed;  Constitutional: Well developed, Well nourished, Well hydrated, In no acute distress; Head:  Normocephalic, atraumatic; Eyes: EOMI, PERRL, No scleral icterus; ENMT: Mouth and pharynx normal, Mucous membranes moist; Neck: Supple, Full range of motion, No lymphadenopathy; Cardiovascular: Regular rate and rhythm, No murmur, rub, or gallop; Respiratory: Breath sounds  clear & equal bilaterally, No rales, rhonchi, wheezes, or rub, Normal respiratory effort/excursion; Chest: Nontender, Movement normal; Abdomen: Soft, Nontender, Nondistended, Normal bowel sounds; Genitourinary: No CVA tenderness; Extremities: Pulses normal, No tenderness, No edema, No calf edema or asymmetry.; Neuro: AA&Ox3, Major CN grossly intact.  No gross focal motor or sensory deficits in extremities.; Skin: Color normal, Warm, Dry, no rash.    ED Course  Procedures  1940:  Attempted to perform pelvic exam with female ED Tech.  Pt will not cooperate fully, allows me  to examine her labia and place speculum in her vagina, but quickly closes her legs, pushes her torso away from me while lifting her low back off the stretcher and pushes the speculum out of her vagina.  There are no obvious lacerations, edema, or rash to her labia or introitus.  There was one large dark blood clot that was at the end of the speculum tip and removed from her vaginal vault, there were no obvious vaginal lacerations to my very brief examination with the speculum.  I did not visualize the cervix.  Pt absolutely will not allow to perform any further vaginal or pelvic exam.  Endorses she has an "appt for post partum depression" tomorrow at [facility].  Will T/C her OB/GYN to obtain f/u for tomorrow.   2030:  T/C to OB/GYN Dr. Despina Hidden, case discussed, including:  HPI, pertinent PM/SHx, VS/PE, dx testing, ED course and treatment:  Agrees with plan to tx for pain, UTI (UC pending), and OB/GYN practitioner can see her in f/u tomorrow.  Dx testing d/w pt and family.  Questions answered.  Verb understanding, agreeable to d/c home with outpt f/u.   MDM  MDM Reviewed: previous chart, nursing note and vitals Interpretation: ultrasound and labs   Results for orders placed during the hospital encounter of 05/12/11  URINALYSIS, ROUTINE W REFLEX MICROSCOPIC      Component Value Range   Color, Urine RED (*) YELLOW    APPearance HAZY (*) CLEAR    Specific Gravity, Urine 1.015  1.005 - 1.030    pH 7.0  5.0 - 8.0    Glucose, UA NEGATIVE  NEGATIVE (mg/dL)   Hgb urine dipstick LARGE (*) NEGATIVE    Bilirubin Urine NEGATIVE  NEGATIVE    Ketones, ur NEGATIVE  NEGATIVE (mg/dL)   Protein, ur 161 (*) NEGATIVE (mg/dL)   Urobilinogen, UA 0.2  0.0 - 1.0 (mg/dL)   Nitrite POSITIVE (*) NEGATIVE    Leukocytes, UA TRACE (*) NEGATIVE   CBC      Component Value Range   WBC 7.7  4.0 - 10.5 (K/uL)   RBC 4.44  3.87 - 5.11 (MIL/uL)   Hemoglobin 13.3  12.0 - 15.0 (g/dL)   HCT 09.6  04.5 - 40.9 (%)   MCV 87.4   78.0 - 100.0 (fL)   MCH 30.0  26.0 - 34.0 (pg)   MCHC 34.3  30.0 - 36.0 (g/dL)   RDW 81.1  91.4 - 78.2 (%)   Platelets 260  150 - 400 (K/uL)  DIFFERENTIAL      Component Value Range   Neutrophils Relative 53  43 - 77 (%)   Neutro Abs 4.1  1.7 - 7.7 (K/uL)   Lymphocytes Relative 32  12 - 46 (%)   Lymphs Abs 2.4  0.7 - 4.0 (K/uL)   Monocytes Relative 7  3 - 12 (%)   Monocytes Absolute 0.5  0.1 - 1.0 (K/uL)   Eosinophils Relative 8 (*) 0 -  5 (%)   Eosinophils Absolute 0.6  0.0 - 0.7 (K/uL)   Basophils Relative 1  0 - 1 (%)   Basophils Absolute 0.0  0.0 - 0.1 (K/uL)  URINE MICROSCOPIC-ADD ON      Component Value Range   WBC, UA TOO NUMEROUS TO COUNT  <3 (WBC/hpf)   Bacteria, UA RARE  RARE     US Pelvis Complete 05/12/2011  *RADIOLOGY REPORT*  Clinical Data: 21 year old female with pelvic pain following normal standard vaginal delivery 4 weeks ago. History of left oophorectomy.  TRANSABDOMINAL AND TRANSVAGINAL ULTRASOUND OF PELVIS Technique:  Both transabdominal and transvaginal ultrasound examinations of the pelvis were performed. Transabdominal technique was performed for global imaging of the pelvis including uterus, ovaries, adnexal regions, and pelvic cul-de-sac.  Comparison: None   It was necessary to proceed with endovaginal exam following the transabdominal exam to visualize the ovaries and endometrial stripe.  Findings:  Uterus: The uterus is normal in size and appearance measuring 8.7 x 5.3 x 6.8 cm.  No focal uterine masses are identified.  Endometrium: The endometrial stripe is normal in appearance and size measuring 10 mm in greatest thickness.  Right ovary:  The right ovary is unremarkable measuring 2.4 x 1.7 x 3.3 cm.  Left ovary: The left ovary is not visualized compatible with oophorectomy  Other findings: There is no evidence of free fluid or solid adnexal mass.  IMPRESSION: Left ovary not visualized compatible with oophorectomy.  Otherwise normal study. No evidence of pelvic mass  or other significant abnormality.  Original Report Authenticated By: Rosendo Gros, M.D.             Laray Anger, DO 05/14/11 2142

## 2011-05-14 LAB — URINE CULTURE: Culture  Setup Time: 201302242045

## 2011-05-15 NOTE — ED Notes (Signed)
+   urine Chart sent to EDP office for review. 

## 2011-05-17 NOTE — ED Notes (Signed)
Rx for  Keflex 500 mg po BID x 7 days written by Trixie Dredge need to be called to pharmacy. Voice mail message left for patient to return call.

## 2011-05-18 NOTE — ED Notes (Signed)
Patient notified; Rx called in to [facility] in [place], [place] [phone]

## 2013-05-29 ENCOUNTER — Emergency Department (HOSPITAL_COMMUNITY)
Admission: EM | Admit: 2013-05-29 | Discharge: 2013-05-29 | Disposition: A | Discharge status: Home / Self Care | Payer: Medicaid Other | Attending: Emergency Medicine | Admitting: Emergency Medicine

## 2013-05-29 ENCOUNTER — Encounter (HOSPITAL_COMMUNITY): Payer: Self-pay | Admitting: Emergency Medicine

## 2013-05-29 DIAGNOSIS — K089 Disorder of teeth and supporting structures, unspecified: Secondary | ICD-10-CM | POA: Insufficient documentation

## 2013-05-29 DIAGNOSIS — Z79899 Other long term (current) drug therapy: Secondary | ICD-10-CM | POA: Insufficient documentation

## 2013-05-29 DIAGNOSIS — Z8659 Personal history of other mental and behavioral disorders: Secondary | ICD-10-CM | POA: Insufficient documentation

## 2013-05-29 DIAGNOSIS — R590 Localized enlarged lymph nodes: Secondary | ICD-10-CM

## 2013-05-29 DIAGNOSIS — D649 Anemia, unspecified: Secondary | ICD-10-CM | POA: Insufficient documentation

## 2013-05-29 DIAGNOSIS — R599 Enlarged lymph nodes, unspecified: Secondary | ICD-10-CM | POA: Insufficient documentation

## 2013-05-29 DIAGNOSIS — K0889 Other specified disorders of teeth and supporting structures: Secondary | ICD-10-CM

## 2013-05-29 MED ORDER — AMOXICILLIN 500 MG PO CAPS: 500.0000 mg | ORAL_CAPSULE | Freq: Three times a day (TID) | ORAL | Status: DC

## 2013-05-29 NOTE — ED Provider Notes (Signed)
CSN: [id number]     Arrival date & time 05/29/13  1602 History   First MD Initiated Contact with Patient 05/29/13 1734     Chief Complaint  Patient presents with  . Neck Pain     (Consider location/radiation/quality/duration/timing/severity/associated sxs/prior Treatment) Patient is a 23 y.o. female presenting with neck pain. The history is provided by the patient. No language interpreter was used.  Neck Pain Pain location:  L side Quality:  Aching Pain radiates to:  Does not radiate Pain severity:  Moderate Pain is:  Same all the time Onset quality:  Unable to specify Duration:  4 weeks Timing:  Constant Progression:  Worsening Relieved by:  Nothing Worsened by:  Nothing tried Ineffective treatments:  None tried Pt complains of knots on the left side of her neck.  Pt reports they began 4 weeks ago.  Pt denies fever,  No known infections.  Pt does have an impacted wisdom tooth  Past Medical History  Diagnosis Date  . Anemia   . Anxiety   . Depression    Past Surgical History  Procedure Laterality Date  . Oophorectomy     Family History  Problem Relation Age of Onset  . Anesthesia problems Neg Hx   . Hypotension Neg Hx   . Malignant hyperthermia Neg Hx   . Pseudochol deficiency Neg Hx    History  Substance Use Topics  . Smoking status: Never Smoker   . Smokeless tobacco: Not on file  . Alcohol Use: No   OB History   Grav Para Term Preterm Abortions TAB SAB Ect Mult Living   1 1 1       1      Review of Systems  Musculoskeletal: Positive for neck pain.  All other systems reviewed and are negative.      Allergies  Coconut fatty acids and Sulfa antibiotics  Home Medications   Current Outpatient Rx  Name  Route  Sig  Dispense  Refill  . Prenatal Vit-Fe Fumarate-FA (PRENATAL MULTIVITAMIN) TABS   Oral   Take 1 tablet by mouth daily.         . promethazine (PHENERGAN) 25 MG tablet   Oral   Take 25 mg by mouth every 6 (six) hours as needed.  Nausea/vomitting           BP 120/62  Pulse 67  Temp(Src) 98.4 F (36.9 C) (Oral)  Resp 16  SpO2 100%  LMP 05/24/2013 Physical Exam  Nursing note and vitals reviewed. Constitutional: She is oriented to person, place, and time. She appears well-developed and well-nourished.  HENT:  Head: Normocephalic.  Impacted lower 3rd molar left  Eyes: EOM are normal.  Neck: Normal range of motion.  Swollen lymph nodes left neck.    Cardiovascular: Normal rate.   Pulmonary/Chest: Effort normal.  Abdominal: Soft. She exhibits no distension.  Musculoskeletal: Normal range of motion.  Lymphadenopathy:    She has cervical adenopathy.  Neurological: She is alert and oriented to person, place, and time.  Skin: Skin is warm.  Psychiatric: She has a normal mood and affect.    ED Course  Procedures (including critical care time) Labs Review Labs Reviewed - No data to display Imaging Review No results found.   EKG Interpretation None      MDM   Final diagnoses:  Cervical lymphadenopathy  Toothache    Dental referrals.   Follow up with Dr. Jeannette How, PA-C 05/29/13 1753

## 2013-05-29 NOTE — ED Notes (Signed)
Pt states "knot" came up on left side of neck ~ 1 month ago. Pt states it hurts to turn her head.

## 2013-05-29 NOTE — Discharge Instructions (Signed)
Cervical Adenitis You have a swollen lymph gland in your neck. This commonly happens with Strep and virus infections, dental problems, insect bites, and injuries about the face, scalp, or neck. The lymph glands swell as the body fights the infection or heals the injury. Swelling and firmness typically lasts for several weeks after the infection or injury is healed. Rarely lymph glands can become swollen because of cancer or TB. Antibiotics are prescribed if there is evidence of an infection. Sometimes an infected lymph gland becomes filled with pus. This condition may require opening up the abscessed gland by draining it surgically. Most of the time infected glands return to normal within two weeks. Do not poke or squeeze the swollen lymph nodes. That may keep them from shrinking back to their normal size. If the lymph gland is still swollen after 2 weeks, further medical evaluation is needed.  SEEK IMMEDIATE MEDICAL CARE IF:  You have difficulty swallowing or breathing, increased swelling, severe pain, or a high fever.  Document Released: 03/04/2005 Document Revised: 05/27/2011 Document Reviewed: 08/24/2006 [facility] Patient Information 2014 [place].

## 2013-05-29 NOTE — ED Provider Notes (Signed)
Medical screening examination/treatment/procedure(s) were performed by non-physician practitioner and as supervising physician I was immediately available for consultation/collaboration.   EKG Interpretation None        Elease Swarm B. Karle Starch, MD 05/29/13 2227

## 2014-01-10 ENCOUNTER — Other Ambulatory Visit: Payer: Self-pay | Admitting: Obstetrics and Gynecology

## 2014-01-10 DIAGNOSIS — O3680X0 Pregnancy with inconclusive fetal viability, not applicable or unspecified: Secondary | ICD-10-CM

## 2014-01-11 ENCOUNTER — Ambulatory Visit (INDEPENDENT_AMBULATORY_CARE_PROVIDER_SITE_OTHER): Payer: Medicaid Other

## 2014-01-11 DIAGNOSIS — O3680X0 Pregnancy with inconclusive fetal viability, not applicable or unspecified: Secondary | ICD-10-CM

## 2014-01-11 NOTE — Progress Notes (Signed)
U/S-single IUP with +FCA noted, FHR-155 bpm, CRL c/w 6+5wks EDD 09/01/2014, cx appears closed, bilateral adnexa appears WNL

## 2014-01-18 ENCOUNTER — Encounter: Payer: Self-pay | Admitting: Women's Health

## 2014-01-18 ENCOUNTER — Ambulatory Visit (INDEPENDENT_AMBULATORY_CARE_PROVIDER_SITE_OTHER): Payer: Medicaid Other | Admitting: *Deleted

## 2014-01-18 ENCOUNTER — Ambulatory Visit (INDEPENDENT_AMBULATORY_CARE_PROVIDER_SITE_OTHER): Payer: Medicaid Other | Admitting: Women's Health

## 2014-01-18 VITALS — BP 120/76 | Wt 199.0 lb

## 2014-01-18 DIAGNOSIS — O09899 Supervision of other high risk pregnancies, unspecified trimester: Secondary | ICD-10-CM | POA: Insufficient documentation

## 2014-01-18 DIAGNOSIS — Z3401 Encounter for supervision of normal first pregnancy, first trimester: Secondary | ICD-10-CM

## 2014-01-18 DIAGNOSIS — Z331 Pregnant state, incidental: Secondary | ICD-10-CM

## 2014-01-18 DIAGNOSIS — Z23 Encounter for immunization: Secondary | ICD-10-CM

## 2014-01-18 DIAGNOSIS — Z3491 Encounter for supervision of normal pregnancy, unspecified, first trimester: Secondary | ICD-10-CM

## 2014-01-18 DIAGNOSIS — Z3682 Encounter for antenatal screening for nuchal translucency: Secondary | ICD-10-CM

## 2014-01-18 DIAGNOSIS — Z0283 Encounter for blood-alcohol and blood-drug test: Secondary | ICD-10-CM

## 2014-01-18 DIAGNOSIS — Z0184 Encounter for antibody response examination: Secondary | ICD-10-CM

## 2014-01-18 DIAGNOSIS — Z1159 Encounter for screening for other viral diseases: Secondary | ICD-10-CM

## 2014-01-18 DIAGNOSIS — Z1389 Encounter for screening for other disorder: Secondary | ICD-10-CM

## 2014-01-18 DIAGNOSIS — Z113 Encounter for screening for infections with a predominantly sexual mode of transmission: Secondary | ICD-10-CM

## 2014-01-18 DIAGNOSIS — Z114 Encounter for screening for human immunodeficiency virus [HIV]: Secondary | ICD-10-CM

## 2014-01-18 DIAGNOSIS — Z118 Encounter for screening for other infectious and parasitic diseases: Secondary | ICD-10-CM

## 2014-01-18 LAB — POCT URINALYSIS DIPSTICK
Blood, UA: NEGATIVE
GLUCOSE UA: NEGATIVE
KETONES UA: NEGATIVE
LEUKOCYTES UA: NEGATIVE
NITRITE UA: NEGATIVE
Protein, UA: NEGATIVE

## 2014-01-18 MED ORDER — DOXYLAMINE-PYRIDOXINE 10-10 MG PO TBEC: DELAYED_RELEASE_TABLET | ORAL | Status: DC

## 2014-01-18 NOTE — Progress Notes (Signed)
  Subjective:  Kimberly Olsen is a 23 y.o. G40P1001 Caucasian female at [redacted]w[redacted]d by 6wk u/s, being seen today for her first obstetrical visit.  Her obstetrical history is significant for term uncomplicated SVB x 1.  Pregnancy history fully reviewed.  Patient reports nausea and vomiting- requests meds. Denies vb, cramping, uti s/s, abnormal/malodorous vag d/c, or vulvovaginal itching/irritation.   BP 120/76 mmHg  Wt 199 lb (90.266 kg)  LMP 05/24/2013  HISTORY: OB History  Gravida Para Term Preterm AB SAB TAB Ectopic Multiple Living  2 1 1       1     # Outcome Date GA Lbr Len/2nd Weight Sex Delivery Anes PTL Lv  2 Current           1 Term 04/11/11 [redacted]w[redacted]d 11:48 / 00:49 7 lb 7.9 oz (3.399 kg) M Vag-Spont EPI N Y     Past Medical History  Diagnosis Date  . Anemia   . Anxiety   . Depression    Past Surgical History  Procedure Laterality Date  . Oophorectomy     Family History  Problem Relation Age of Onset  . Anesthesia problems Neg Hx   . Hypotension Neg Hx   . Malignant hyperthermia Neg Hx   . Pseudochol deficiency Neg Hx   . Depression Mother   . Asthma Mother   . Mental illness Mother   . Depression Father   . Mental illness Father     Exam   System:     General: Well developed & nourished, no acute distress   Skin: Warm & dry, normal coloration and turgor, no rashes   Neurologic: Alert & oriented, normal mood   Cardiovascular: Regular rate & rhythm   Respiratory: Effort & rate normal, LCTAB, acyanotic   Abdomen: Soft, non tender   Extremities: normal strength, tone   Thin prep pap smear neg Sept 2015 at [facility] per pt  FHR: 174 via u/s   Assessment:   Pregnancy: G2P1001 Patient Active Problem List   Diagnosis Date Noted  . Supervision of normal pregnancy 01/18/2014    Priority: High    [redacted]w[redacted]d G2P1001 New OB visit N/V of pregnancy   Plan:  Initial labs drawn Continue prenatal vitamins Problem list reviewed and updated Reviewed n/v relief measures and  warning s/s to report Rx diclegis, prior auth sent, and 2 samples given.  Reviewed recommended weight gain based on pre-gravid BMI Encouraged well-balanced diet Genetic Screening discussed Integrated Screen: requested Cystic fibrosis screening discussed declined Ultrasound discussed; fetal survey: requested Follow up in 4 weeks for 1st it/nt and visit [place] completed Flu shot today  Tawnya Crook CNM, [facility] 01/18/2014 4:22 PM

## 2014-01-18 NOTE — Patient Instructions (Signed)

## 2014-01-19 ENCOUNTER — Encounter: Payer: Self-pay | Admitting: Women's Health

## 2014-01-19 DIAGNOSIS — F1290 Cannabis use, unspecified, uncomplicated: Secondary | ICD-10-CM | POA: Insufficient documentation

## 2014-01-19 LAB — HEPATITIS B SURFACE ANTIGEN: HEP B S AG: NEGATIVE

## 2014-01-19 LAB — CBC
HCT: 37.9 % (ref 36.0–46.0)
Hemoglobin: 12.9 g/dL (ref 12.0–15.0)
MCH: 28.5 pg (ref 26.0–34.0)
MCHC: 34 g/dL (ref 30.0–36.0)
MCV: 83.7 fL (ref 78.0–100.0)
PLATELETS: 348 10*3/uL (ref 150–400)
RBC: 4.53 MIL/uL (ref 3.87–5.11)
RDW: 13.9 % (ref 11.5–15.5)
WBC: 14.5 10*3/uL — AB (ref 4.0–10.5)

## 2014-01-19 LAB — URINALYSIS, ROUTINE W REFLEX MICROSCOPIC
BILIRUBIN URINE: NEGATIVE
GLUCOSE, UA: NEGATIVE mg/dL
Hgb urine dipstick: NEGATIVE
KETONES UR: NEGATIVE mg/dL
Nitrite: NEGATIVE
PROTEIN: NEGATIVE mg/dL
Specific Gravity, Urine: 1.013 (ref 1.005–1.030)
Urobilinogen, UA: 0.2 mg/dL (ref 0.0–1.0)
pH: 6 (ref 5.0–8.0)

## 2014-01-19 LAB — ABO AND RH: RH TYPE: POSITIVE

## 2014-01-19 LAB — DRUG SCREEN, URINE, NO CONFIRMATION
Amphetamine Screen, Ur: NEGATIVE
BARBITURATE QUANT UR: NEGATIVE
Benzodiazepines.: NEGATIVE
Cocaine Metabolites: NEGATIVE
Creatinine,U: 187.4 mg/dL
MARIJUANA METABOLITE: POSITIVE — AB
Methadone: NEGATIVE
OPIATE SCREEN, URINE: NEGATIVE
PHENCYCLIDINE (PCP): NEGATIVE
PROPOXYPHENE: NEGATIVE

## 2014-01-19 LAB — HIV ANTIBODY (ROUTINE TESTING W REFLEX): HIV: NONREACTIVE

## 2014-01-19 LAB — RUBELLA SCREEN: Rubella: 2.39 Index — ABNORMAL HIGH (ref <0.90)

## 2014-01-19 LAB — GC/CHLAMYDIA PROBE AMP
CT PROBE, AMP APTIMA: NEGATIVE
GC PROBE AMP APTIMA: NEGATIVE

## 2014-01-19 LAB — URINALYSIS, MICROSCOPIC ONLY
CASTS: NONE SEEN
Crystals: NONE SEEN

## 2014-01-19 LAB — OXYCODONE SCREEN, UA, RFLX CONFIRM: Oxycodone Screen, Ur: NEGATIVE ng/mL

## 2014-01-19 LAB — ANTIBODY SCREEN: Antibody Screen: NEGATIVE

## 2014-01-19 LAB — VARICELLA ZOSTER ANTIBODY, IGG

## 2014-01-19 LAB — RPR

## 2014-01-20 LAB — URINE CULTURE
COLONY COUNT: NO GROWTH
ORGANISM ID, BACTERIA: NO GROWTH

## 2014-01-31 ENCOUNTER — Telehealth: Payer: Self-pay | Admitting: Women's Health

## 2014-01-31 ENCOUNTER — Other Ambulatory Visit: Payer: Self-pay | Admitting: *Deleted

## 2014-01-31 MED ORDER — DOXYLAMINE-PYRIDOXINE 10-10 MG PO TBEC: DELAYED_RELEASE_TABLET | ORAL | Status: DC

## 2014-01-31 NOTE — Telephone Encounter (Signed)
Informed pts mother that we have a couple sample bottles at front desk for her to pick up.

## 2014-01-31 NOTE — Telephone Encounter (Signed)
Pt states her Medicaid has still not gone through, she is going back up to the [facility] office tomorrow.  She states the Diclegis is helping but she only has two pills left, wants to know if she can get more samples

## 2014-02-14 ENCOUNTER — Telehealth: Payer: Self-pay | Admitting: Women's Health

## 2014-02-14 NOTE — Telephone Encounter (Signed)
Pt informed that I spoke with [facility] and RX should be there.

## 2014-02-14 NOTE — Telephone Encounter (Signed)
Pt states that [place] says they are waiting on a Prior Auth on her Diclegis and have told her they have contacted our office a couple of times and she is down to one pill left.  I called [facility] in [place] because Kim sent in the prior auth when she sent in the RX, [facility] is stating that they have never received a prescription for Diclegis for this pt.  I gave them a verbal for the medication.

## 2014-02-15 ENCOUNTER — Encounter: Payer: Medicaid Other | Admitting: Women's Health

## 2014-02-15 ENCOUNTER — Other Ambulatory Visit: Payer: Medicaid Other

## 2014-02-18 ENCOUNTER — Other Ambulatory Visit: Payer: Medicaid Other

## 2014-02-18 ENCOUNTER — Encounter: Payer: Medicaid Other | Admitting: Adult Health

## 2014-02-24 ENCOUNTER — Ambulatory Visit (INDEPENDENT_AMBULATORY_CARE_PROVIDER_SITE_OTHER): Payer: Medicaid Other

## 2014-02-24 ENCOUNTER — Ambulatory Visit (INDEPENDENT_AMBULATORY_CARE_PROVIDER_SITE_OTHER): Payer: Medicaid Other | Admitting: Obstetrics & Gynecology

## 2014-02-24 ENCOUNTER — Encounter: Payer: Self-pay | Admitting: Obstetrics & Gynecology

## 2014-02-24 VITALS — BP 114/76 | Wt 197.5 lb

## 2014-02-24 DIAGNOSIS — Z3682 Encounter for antenatal screening for nuchal translucency: Secondary | ICD-10-CM

## 2014-02-24 DIAGNOSIS — Z1389 Encounter for screening for other disorder: Secondary | ICD-10-CM

## 2014-02-24 DIAGNOSIS — Z331 Pregnant state, incidental: Secondary | ICD-10-CM

## 2014-02-24 DIAGNOSIS — Z3481 Encounter for supervision of other normal pregnancy, first trimester: Secondary | ICD-10-CM

## 2014-02-24 DIAGNOSIS — Z36 Encounter for antenatal screening of mother: Secondary | ICD-10-CM

## 2014-02-24 LAB — POCT URINALYSIS DIPSTICK
Blood, UA: NEGATIVE
Glucose, UA: NEGATIVE
Nitrite, UA: NEGATIVE
PROTEIN UA: NEGATIVE

## 2014-02-24 MED ORDER — DOXYLAMINE-PYRIDOXINE 10-10 MG PO TBEC: DELAYED_RELEASE_TABLET | ORAL | Status: DC

## 2014-02-24 NOTE — Progress Notes (Signed)
U/S(13+0wks)-active fetus, meas c/w dates, NB present, NT-1.60mm, anterior Gr 0 placenta, cx appears closed (3.3cm), bilateral adnexa appears WNL, FHR-153 bpm

## 2014-02-24 NOTE — Progress Notes (Signed)
G2P1001 [redacted]w[redacted]d Estimated Date of Delivery: 09/01/14  Blood pressure 114/76, weight 197 lb 8 oz (89.585 kg), last menstrual period 05/24/2013.   BP weight and urine results all reviewed and noted.  Please refer to the obstetrical flow sheet for the fundal height and fetal heart rate documentation:  Patient reports good fetal movement, denies any bleeding and no rupture of membranes symptoms or regular contractions. Patient is without complaints. All questions were answered.  Plan:  Continued routine obstetrical care,   Follow up in 4 weeks for OB appointment, 2nd IT

## 2014-03-01 LAB — MATERNAL SCREEN, INTEGRATED #1

## 2014-03-18 NOTE — L&D Delivery Note (Signed)
Patient is 24 y.o. G2P1001 [redacted]w[redacted]d admitted in active labor, hx of +THC, A1DM   Delivery Note At 10:50 PM a viable female was delivered via Vaginal, Spontaneous Delivery (Presentation: ;  ).  APGAR: 8, 9; weight pending Placenta status: Intact, Spontaneous.  Cord: 3 vessels with the following complications: None.   Anesthesia: None  Episiotomy: None Lacerations: None Suture Repair: n/a Est. Blood Loss (mL): 168  Mom to postpartum.  Baby to Couplet care / Skin to Skin.  Kimberly Olsen 08/27/2014, 11:35 PM

## 2014-03-24 ENCOUNTER — Ambulatory Visit (INDEPENDENT_AMBULATORY_CARE_PROVIDER_SITE_OTHER): Payer: Medicaid Other | Admitting: Advanced Practice Midwife

## 2014-03-24 VITALS — BP 136/82 | Wt 199.0 lb

## 2014-03-24 DIAGNOSIS — Z3682 Encounter for antenatal screening for nuchal translucency: Secondary | ICD-10-CM

## 2014-03-24 DIAGNOSIS — Z331 Pregnant state, incidental: Secondary | ICD-10-CM

## 2014-03-24 DIAGNOSIS — Z1389 Encounter for screening for other disorder: Secondary | ICD-10-CM

## 2014-03-24 DIAGNOSIS — Z3402 Encounter for supervision of normal first pregnancy, second trimester: Secondary | ICD-10-CM

## 2014-03-24 LAB — POCT URINALYSIS DIPSTICK
Blood, UA: NEGATIVE
GLUCOSE UA: NEGATIVE
Leukocytes, UA: NEGATIVE
Nitrite, UA: NEGATIVE
Protein, UA: NEGATIVE

## 2014-03-24 MED ORDER — OMEPRAZOLE 20 MG PO CPDR: 20.0000 mg | DELAYED_RELEASE_CAPSULE | Freq: Every day | ORAL | Status: DC

## 2014-03-24 NOTE — Addendum Note (Signed)
Addended by: Christin Fudge on: 03/24/2014 04:36 PM   Modules accepted: Level of Service

## 2014-03-24 NOTE — Addendum Note (Signed)
Addended by: Doyne Keel on: 03/24/2014 04:43 PM   Modules accepted: Orders

## 2014-03-24 NOTE — Progress Notes (Signed)
G2P1001 [redacted]w[redacted]d Estimated Date of Delivery: 09/01/14  Blood pressure 136/82, weight 199 lb (90.266 kg), last menstrual period 05/24/2013.   BP weight and urine results all reviewed and noted.  Please refer to the obstetrical flow sheet for the fundal height and fetal heart rate documentation:  Patient reports good fetal movement, denies any bleeding and no rupture of membranes symptoms or regular contractions. Patient C/O continued nausea, heartburn,  All questions were answered.  Plan:  Continued routine obstetrical care, start prilosec.  Wasn't takng diclegis in the day;  Start taking in am and afternoon  Follow up in 2  weeks for OB appointment, ANATOMY SCAN

## 2014-03-30 LAB — MATERNAL SCREEN, INTEGRATED #2
AFP MOM MAT SCREEN: 1.61
AFP, Serum: 51.9 ng/mL
Age risk Down Syndrome: 1:990 {titer}
CALCULATED GESTATIONAL AGE MAT SCREEN: 17.3
CROWN RUMP LENGTH MAT SCREEN 2: 73.7 mm
ESTRIOL MOM MAT SCREEN: 1.13
Estriol, Free: 1.16 ng/mL
HCG, SERUM MAT SCREEN: 11.9 [IU]/mL
INHIBIN A DIMERIC MAT SCREEN: 131 pg/mL
INHIBIN A MOM MAT SCREEN: 0.91
MSS Down Syndrome: <1:5000 {titer}
MSS Trisomy 18 Risk: <1:5000 {titer}
NT MoM: 1.04
Nuchal Translucency: 1.69 mm
Number of fetuses: 1
PAPP-A MOM MAT SCREEN: 0.75
PAPP-A: 444 ng/mL
Rish for ONTD: 1:2300 {titer}
hCG MoM: 0.51

## 2014-04-04 ENCOUNTER — Other Ambulatory Visit: Payer: Self-pay | Admitting: Advanced Practice Midwife

## 2014-04-04 DIAGNOSIS — Z3689 Encounter for other specified antenatal screening: Secondary | ICD-10-CM

## 2014-04-04 DIAGNOSIS — F1910 Other psychoactive substance abuse, uncomplicated: Secondary | ICD-10-CM

## 2014-04-04 DIAGNOSIS — O99320 Drug use complicating pregnancy, unspecified trimester: Principal | ICD-10-CM

## 2014-04-07 ENCOUNTER — Ambulatory Visit (INDEPENDENT_AMBULATORY_CARE_PROVIDER_SITE_OTHER): Payer: Medicaid Other | Admitting: Obstetrics & Gynecology

## 2014-04-07 ENCOUNTER — Ambulatory Visit (INDEPENDENT_AMBULATORY_CARE_PROVIDER_SITE_OTHER): Payer: Medicaid Other

## 2014-04-07 ENCOUNTER — Encounter: Payer: Self-pay | Admitting: Obstetrics & Gynecology

## 2014-04-07 VITALS — BP 100/80 | Wt 198.0 lb

## 2014-04-07 DIAGNOSIS — Z1389 Encounter for screening for other disorder: Secondary | ICD-10-CM

## 2014-04-07 DIAGNOSIS — Z331 Pregnant state, incidental: Secondary | ICD-10-CM

## 2014-04-07 DIAGNOSIS — O99320 Drug use complicating pregnancy, unspecified trimester: Secondary | ICD-10-CM

## 2014-04-07 DIAGNOSIS — Z36 Encounter for antenatal screening of mother: Secondary | ICD-10-CM

## 2014-04-07 DIAGNOSIS — F1910 Other psychoactive substance abuse, uncomplicated: Secondary | ICD-10-CM

## 2014-04-07 DIAGNOSIS — Z3402 Encounter for supervision of normal first pregnancy, second trimester: Secondary | ICD-10-CM

## 2014-04-07 DIAGNOSIS — Z3689 Encounter for other specified antenatal screening: Secondary | ICD-10-CM

## 2014-04-07 DIAGNOSIS — Z3492 Encounter for supervision of normal pregnancy, unspecified, second trimester: Secondary | ICD-10-CM

## 2014-04-07 LAB — POCT URINALYSIS DIPSTICK
Blood, UA: NEGATIVE
Glucose, UA: NEGATIVE
KETONES UA: 3
Leukocytes, UA: NEGATIVE
Nitrite, UA: NEGATIVE
Protein, UA: NEGATIVE

## 2014-04-07 NOTE — Progress Notes (Signed)
Detailed anatomic survey US performed today.  Single, active female IUP (19+[redacted] weeks GA) in a breech presentation.  FHR 139 bpm.  Bilateral ovaries are not visualized today; however, no adnexal abnormalities are visualized. Anterior fundal Gr 1 placenta.  Normal fluid with SVP of 4.74 cm.  Cervix is closed and measures 3.38cm. EFW today of 338 g which is consistent with dating. Spine and 4 chamber heart not well visualized today.

## 2014-04-07 NOTE — Progress Notes (Signed)
G2P1001 [redacted]w[redacted]d Estimated Date of Delivery: 09/01/14  Blood pressure 100/80, weight 198 lb (89.812 kg), last menstrual period 05/24/2013.   BP weight and urine results all reviewed and noted.  Please refer to the obstetrical flow sheet for the fundal height and fetal heart rate documentation:  Patient reports good fetal movement, denies any bleeding and no rupture of membranes symptoms or regular contractions. Patient is without complaints. All questions were answered.  Plan:  Continued routine obstetrical care,   Follow up in 4 weeks for OB appointment, routine

## 2014-05-05 ENCOUNTER — Encounter: Payer: Medicaid Other | Admitting: Obstetrics & Gynecology

## 2014-05-12 ENCOUNTER — Encounter: Payer: Medicaid Other | Admitting: Obstetrics & Gynecology

## 2014-05-17 ENCOUNTER — Encounter: Payer: Medicaid Other | Admitting: Obstetrics & Gynecology

## 2014-05-24 ENCOUNTER — Encounter: Payer: Self-pay | Admitting: Obstetrics & Gynecology

## 2014-05-24 ENCOUNTER — Ambulatory Visit (INDEPENDENT_AMBULATORY_CARE_PROVIDER_SITE_OTHER): Payer: Medicaid Other | Admitting: Obstetrics & Gynecology

## 2014-05-24 VITALS — BP 118/70 | Wt 196.0 lb

## 2014-05-24 DIAGNOSIS — Z1389 Encounter for screening for other disorder: Secondary | ICD-10-CM

## 2014-05-24 DIAGNOSIS — Z3402 Encounter for supervision of normal first pregnancy, second trimester: Secondary | ICD-10-CM

## 2014-05-24 DIAGNOSIS — Z331 Pregnant state, incidental: Secondary | ICD-10-CM

## 2014-05-24 NOTE — Addendum Note (Signed)
Addended by: Florian Buff on: 05/24/2014 04:34 PM   Modules accepted: Level of Service

## 2014-05-24 NOTE — Progress Notes (Signed)
G2P1001 [redacted]w[redacted]d Estimated Date of Delivery: 09/01/14  Blood pressure 118/70, weight 196 lb (88.905 kg), last menstrual period 05/24/2013.   BP weight and urine results all reviewed and noted.  Please refer to the obstetrical flow sheet for the fundal height and fetal heart rate documentation:  Patient reports good fetal movement, denies any bleeding and no rupture of membranes symptoms or regular contractions. Patient is without complaints. All questions were answered.  Plan:  Continued routine obstetrical care,   Follow up in 4 weeks for OB appointment, PN2  Legs spider angiomas? Not infectious

## 2014-06-13 ENCOUNTER — Encounter: Payer: Self-pay | Admitting: Obstetrics and Gynecology

## 2014-06-13 ENCOUNTER — Ambulatory Visit (INDEPENDENT_AMBULATORY_CARE_PROVIDER_SITE_OTHER): Payer: Medicaid Other | Admitting: Obstetrics and Gynecology

## 2014-06-13 ENCOUNTER — Other Ambulatory Visit: Payer: Medicaid Other

## 2014-06-13 DIAGNOSIS — Z113 Encounter for screening for infections with a predominantly sexual mode of transmission: Secondary | ICD-10-CM

## 2014-06-13 DIAGNOSIS — Z3483 Encounter for supervision of other normal pregnancy, third trimester: Secondary | ICD-10-CM

## 2014-06-13 DIAGNOSIS — Z331 Pregnant state, incidental: Secondary | ICD-10-CM

## 2014-06-13 DIAGNOSIS — O24419 Gestational diabetes mellitus in pregnancy, unspecified control: Secondary | ICD-10-CM

## 2014-06-13 DIAGNOSIS — Z131 Encounter for screening for diabetes mellitus: Secondary | ICD-10-CM

## 2014-06-13 DIAGNOSIS — Z1389 Encounter for screening for other disorder: Secondary | ICD-10-CM

## 2014-06-13 DIAGNOSIS — Z114 Encounter for screening for human immunodeficiency virus [HIV]: Secondary | ICD-10-CM

## 2014-06-13 DIAGNOSIS — Z0184 Encounter for antibody response examination: Secondary | ICD-10-CM

## 2014-06-13 LAB — POCT URINALYSIS DIPSTICK
GLUCOSE UA: NEGATIVE
Ketones, UA: NEGATIVE
Leukocytes, UA: NEGATIVE
NITRITE UA: NEGATIVE
PROTEIN UA: NEGATIVE
RBC UA: NEGATIVE

## 2014-06-13 NOTE — Progress Notes (Signed)
Patient ID: Kimberly Olsen, female   DOB: 05-29-1990, 24 y.o.   MRN: [id number]  This chart was SCRIBED for Mallory Shirk, MD by Stephania Fragmin, ED Scribe. This patient was seen in room 1 and the patient's care was started at 10:00 AM.   G2P1001 [redacted]w[redacted]d Estimated Date of Delivery: 09/01/14  Blood pressure 118/66, pulse 84, weight 207 lb 8 oz (94.121 kg), last menstrual period 05/24/2013.   refer to the ob flow sheet for FH and FHR, also BP, Wt, Urine results:notable for negative  Patient reports  + good fetal movement, denies any bleeding and no rupture of membranes symptoms or regular contractions. Patient complaints: She states she is continuing to use Diclegis and Prilosec. Patient would like to take a blood sugar test today. Patient notes she has been having cravings for soap and laundry detergent, but denies actually ingesting any. She has no other complaints at this time.  Patient plans to breastfeed and bottle feed the baby. She notes she had difficulty breastfeeding with her last pregnancy. Patient plans to use a BCP for birth control following this pregnancy. She states she has used Nexplanon in the past with adverse side effects.    FH: 26 cm FHR: 147 bpm  10:07 AM - Counseled patient on avoiding BCPs when breastfeeding, suggesting other contraceptive methods such as condoms. Also answered any questions patient had about her cravings for soap and laundry detergent. Patient mentioned she had difficulty breastfeeding last time. Discussed options for classes in lactation consulting.  Questions were answered. Assessment: Pregnancy 28w, PICA,  Plan:  Continued routine obstetrical care, PN2 labs today, encouraged to take PN Vits.  F/u in 4 weeks for pnx care   I personally performed the services described in this documentation, which was SCRIBED in my presence. The recorded information has been reviewed and considered accurate. It has been edited as necessary during review. Jonnie Kind,  MD

## 2014-06-13 NOTE — Progress Notes (Signed)
Pt denies any problems or concerns at this time. Pt states that she has been craving soap and laundry detergent.

## 2014-06-14 ENCOUNTER — Telehealth: Payer: Self-pay | Admitting: *Deleted

## 2014-06-14 LAB — RPR: RPR: NONREACTIVE

## 2014-06-14 LAB — HSV 2 ANTIBODY, IGG

## 2014-06-14 LAB — CBC
HEMATOCRIT: 34.2 % (ref 34.0–46.6)
Hemoglobin: 11 g/dL — ABNORMAL LOW (ref 11.1–15.9)
MCH: 27.4 pg (ref 26.6–33.0)
MCHC: 32.2 g/dL (ref 31.5–35.7)
MCV: 85 fL (ref 79–97)
PLATELETS: 275 10*3/uL (ref 150–379)
RBC: 4.02 x10E6/uL (ref 3.77–5.28)
RDW: 14.1 % (ref 12.3–15.4)
WBC: 13.7 10*3/uL — AB (ref 3.4–10.8)

## 2014-06-14 LAB — GLUCOSE TOLERANCE, 2 HOURS W/ 1HR
GLUCOSE, FASTING: 80 mg/dL (ref 65–91)
Glucose, 1 hour: 213 mg/dL — ABNORMAL HIGH (ref 65–179)
Glucose, 2 hour: 142 mg/dL (ref 65–152)

## 2014-06-14 LAB — ANTIBODY SCREEN: ANTIBODY SCREEN: NEGATIVE

## 2014-06-14 LAB — HIV ANTIBODY (ROUTINE TESTING W REFLEX): HIV Screen 4th Generation wRfx: NONREACTIVE

## 2014-06-14 NOTE — Telephone Encounter (Signed)
Pt informed of abnormal Glucose tolerance test, DX with Gestation Diabetes. Referral for diabetic counsellor complete.

## 2014-06-14 NOTE — Addendum Note (Signed)
Addended by: Doyne Keel on: 06/14/2014 02:38 PM   Modules accepted: Orders

## 2014-06-14 NOTE — Telephone Encounter (Signed)
-----   Message from Florian Buff, MD sent at 06/14/2014  9:52 AM EDT ----- Please call patient and inform her she failed her 2 hour sugar test and is therefor gestational diabetic and also schedule her appointment with the diabetic counsellor   Thanks   Dr Elonda Husky ----- Message -----    From: [facility] Lab Results In Interface    Sent: 06/14/2014   7:46 AM      To: Florian Buff, MD

## 2014-06-21 ENCOUNTER — Telehealth: Payer: Self-pay | Admitting: Obstetrics and Gynecology

## 2014-06-22 NOTE — Telephone Encounter (Signed)
Pt informed Jearld Fenton, Dietician at Dr. Liliane Channel office may be able to see pt for A1DM. Pt states will call and make an appt.

## 2014-07-06 ENCOUNTER — Encounter: Payer: Medicaid Other | Attending: Obstetrics & Gynecology

## 2014-07-07 ENCOUNTER — Telehealth: Payer: Self-pay | Admitting: *Deleted

## 2014-07-07 NOTE — Telephone Encounter (Signed)
Pt given number to Dr. Liliane Channel office and aware that front is going to send over her information to the office.

## 2014-07-13 ENCOUNTER — Encounter: Payer: Medicaid Other | Admitting: Obstetrics and Gynecology

## 2014-07-20 ENCOUNTER — Ambulatory Visit (INDEPENDENT_AMBULATORY_CARE_PROVIDER_SITE_OTHER): Payer: Medicaid Other | Admitting: Obstetrics and Gynecology

## 2014-07-20 VITALS — BP 100/60 | HR 76 | Wt 201.5 lb

## 2014-07-20 DIAGNOSIS — O2343 Unspecified infection of urinary tract in pregnancy, third trimester: Secondary | ICD-10-CM

## 2014-07-20 LAB — POCT URINALYSIS DIPSTICK
Blood, UA: NEGATIVE
GLUCOSE UA: NEGATIVE
KETONES UA: NEGATIVE
LEUKOCYTES UA: NEGATIVE
Nitrite, UA: NEGATIVE

## 2014-07-20 MED ORDER — NITROFURANTOIN MONOHYD MACRO 100 MG PO CAPS: 100.0000 mg | ORAL_CAPSULE | Freq: Two times a day (BID) | ORAL | Status: DC

## 2014-07-20 NOTE — Progress Notes (Signed)
Patient ID: NYIA TSAO, female   DOB: 18-Apr-1990, 24 y.o.   MRN: [id number]  G2P1001 [redacted]w[redacted]d Estimated Date of Delivery: 09/01/14  Blood pressure 100/60, pulse 76, weight 201 lb 8 oz (91.4 kg), last menstrual period 05/24/2013.   refer to the ob flow sheet for FH and FHR, also BP, Wt, Urine results:notable for negative, even with pt hx of + blood in urine last nite  Patient reports  + good fetal movement, denies any bleeding and no rupture of membranes symptoms or regular contractions. Patient complaints: Patient reports minimal amounts of blood in the toilet yesterday, and her urine appeared pink. However, she did not notice any blood last night or this morning. This is a new problem, and she did not have this problem with her first pregnancy.  Patient's first child was delivered vaginally. This will be her second child.   FHR: 161 Cervix: long, closed; no blood noted on secretions Breech noted on exam  Vaginal wet prep:microscope shows positive for white cells; negative for red cells   Questions were answered. Assessment: Pregnancy [redacted]w[redacted]d,                        UTI rX macrobid                        No evidence of vaginal bleeding                       Uncertain presentation  Plan:  Continued routine obstetrical care  F/u in 1 weeks for pnx care, rx macrobid   This chart was SCRIBED for Mallory Shirk, MD by Stephania Fragmin, ED Scribe. This patient was seen in room 1 and the patient's care was started at 12:00 PM.   I personally performed the services described in this documentation, which was SCRIBED in my presence. The recorded information has been reviewed and considered accurate. It has been edited as necessary during review. Jonnie Kind, MD

## 2014-07-20 NOTE — Progress Notes (Signed)
Pt states that she had blood in her urine last night and that she had really bad lower back pain yesterday. Pt denies any vaginal bleeding or cramping/contractions.

## 2014-08-03 ENCOUNTER — Ambulatory Visit (INDEPENDENT_AMBULATORY_CARE_PROVIDER_SITE_OTHER): Payer: Medicaid Other | Admitting: Obstetrics and Gynecology

## 2014-08-03 ENCOUNTER — Encounter: Payer: Self-pay | Admitting: Obstetrics and Gynecology

## 2014-08-03 VITALS — BP 120/80 | HR 100 | Wt 204.0 lb

## 2014-08-03 DIAGNOSIS — Z1389 Encounter for screening for other disorder: Secondary | ICD-10-CM

## 2014-08-03 DIAGNOSIS — Z331 Pregnant state, incidental: Secondary | ICD-10-CM

## 2014-08-03 DIAGNOSIS — Z3493 Encounter for supervision of normal pregnancy, unspecified, third trimester: Secondary | ICD-10-CM

## 2014-08-03 LAB — POCT URINALYSIS DIPSTICK
Blood, UA: NEGATIVE
GLUCOSE UA: NEGATIVE
LEUKOCYTES UA: NEGATIVE
NITRITE UA: NEGATIVE
Protein, UA: NEGATIVE

## 2014-08-03 NOTE — Progress Notes (Signed)
Patient ID: Kimberly Olsen, female   DOB: Aug 11, 1990, 24 y.o.   MRN: [id number]  G2P1001 [redacted]w[redacted]d Estimated Date of Delivery: 09/01/14  ? PRESENTATION? Blood pressure 120/80, pulse 100, weight 204 lb (92.534 kg), last menstrual period 05/24/2013.   refer to the ob flow sheet for FH and FHR, also BP, Wt, Urine results:notable for negative   Patient reports  + good fetal movement, denies any bleeding and no rupture of membranes symptoms or regular contractions. Patient complaints: Patient has no complaints, although she had an uncertain presentation at 34 weeks. Patient states she has been feeling some suprapubic pressure lately, so she thinks the baby may be facing head down.  FHR: 154 bpm FH: 35 cm  Questions were answered. Assessment: Pregnancy [redacted]w[redacted]d, vertex presentation confirmed via U/S. Plan:  Continued routine obstetrical care.  F/u in 1 weeks for pnx care, GBS  This chart was SCRIBED for Mallory Shirk, MD by Stephania Fragmin, ED Scribe. This patient was seen in room 2 and the patient's care was started at 2:03 PM.    I personally performed the services described in this documentation, which was SCRIBED in my presence. The recorded information has been reviewed and considered accurate. It has been edited as necessary during review. Jonnie Kind, MD

## 2014-08-03 NOTE — Progress Notes (Signed)
Pt denies any problems or concerns at this time.  

## 2014-08-10 ENCOUNTER — Ambulatory Visit (INDEPENDENT_AMBULATORY_CARE_PROVIDER_SITE_OTHER): Payer: Medicaid Other | Admitting: Obstetrics & Gynecology

## 2014-08-10 ENCOUNTER — Encounter: Payer: Self-pay | Admitting: Obstetrics & Gynecology

## 2014-08-10 VITALS — BP 118/66 | HR 72 | Wt 204.0 lb

## 2014-08-10 DIAGNOSIS — Z3493 Encounter for supervision of normal pregnancy, unspecified, third trimester: Secondary | ICD-10-CM

## 2014-08-10 DIAGNOSIS — Z369 Encounter for antenatal screening, unspecified: Secondary | ICD-10-CM

## 2014-08-10 NOTE — Progress Notes (Signed)
Pt denies any problems or concerns at this time.  

## 2014-08-10 NOTE — Progress Notes (Signed)
G2P1001 [redacted]w[redacted]d Estimated Date of Delivery: 09/01/14  Blood pressure 118/66, pulse 72, weight 204 lb (92.534 kg), last menstrual period 05/24/2013.   BP weight and urine results all reviewed and noted.  Please refer to the obstetrical flow sheet for the fundal height and fetal heart rate documentation:  Patient reports good fetal movement, denies any bleeding and no rupture of membranes symptoms or regular contractions. Patient is without complaints. All questions were answered.  Plan:  Continued routine obstetrical care,   Follow up in 1 weeks for OB appointment, routine  Carrying quite low on exam which lowers the [place]

## 2014-08-10 NOTE — Addendum Note (Signed)
Addended by: Farley Ly on: 08/10/2014 04:23 PM   Modules accepted: Orders

## 2014-08-12 LAB — OB RESULTS CONSOLE GC/CHLAMYDIA
CHLAMYDIA, DNA PROBE: NEGATIVE
Gonorrhea: NEGATIVE

## 2014-08-12 LAB — OB RESULTS CONSOLE GBS: GBS: POSITIVE

## 2014-08-12 LAB — GC/CHLAMYDIA PROBE AMP
Chlamydia trachomatis, NAA: NEGATIVE
NEISSERIA GONORRHOEAE BY PCR: NEGATIVE

## 2014-08-12 LAB — STREP GP B NAA: STREP GROUP B AG: POSITIVE — AB

## 2014-08-17 ENCOUNTER — Encounter: Payer: Self-pay | Admitting: Advanced Practice Midwife

## 2014-08-17 ENCOUNTER — Encounter: Payer: Medicaid Other | Admitting: Advanced Practice Midwife

## 2014-08-17 ENCOUNTER — Ambulatory Visit (INDEPENDENT_AMBULATORY_CARE_PROVIDER_SITE_OTHER): Payer: Medicaid Other | Admitting: Advanced Practice Midwife

## 2014-08-17 VITALS — BP 110/70 | HR 80 | Wt 206.0 lb

## 2014-08-17 DIAGNOSIS — Z331 Pregnant state, incidental: Secondary | ICD-10-CM

## 2014-08-17 DIAGNOSIS — Z1389 Encounter for screening for other disorder: Secondary | ICD-10-CM

## 2014-08-17 DIAGNOSIS — O24410 Gestational diabetes mellitus in pregnancy, diet controlled: Secondary | ICD-10-CM | POA: Insufficient documentation

## 2014-08-17 DIAGNOSIS — O09893 Supervision of other high risk pregnancies, third trimester: Secondary | ICD-10-CM

## 2014-08-17 LAB — POCT URINALYSIS DIPSTICK
GLUCOSE UA: NEGATIVE
Ketones, UA: NEGATIVE
Leukocytes, UA: NEGATIVE
Nitrite, UA: NEGATIVE
RBC UA: NEGATIVE

## 2014-08-17 LAB — GLUCOSE, POCT (MANUAL RESULT ENTRY): POC Glucose: 67 mg/dl — AB (ref 70–99)

## 2014-08-17 NOTE — Patient Instructions (Signed)
Fasting <90 2 hours after meals: <120

## 2014-08-17 NOTE — Progress Notes (Signed)
Fetal Surveillance Testing today:  none   High Risk Pregnancy Diagnosis(es):  A1DM  G2P1001 [redacted]w[redacted]d Estimated Date of Delivery: 09/01/14  Blood pressure 110/70, pulse 80, weight 206 lb (93.441 kg), last menstrual period 05/24/2013.  Urinalysis: Negative Random blood sugar (~ 2 hours after eating) 69  HPI: The patient is being seen today for ongoing management of pregnancy.  She went to the nutrition class at [facility] They did not give her a glucometer, and [facility] never got the order, so she has not been checking her blood sugars.  She doesn't really bellieve that she failed the GTT (says glucola was 2 months expired and she had been eating easter candy the day before). . Today she reports no c/o   BP weight and urine results all reviewed and noted. Patient reports good fetal movement, denies any bleeding and no rupture of membranes symptoms or regular contractions.  Fundal Height:  35 Fetal Heart rate:  128 Edema:  no  Patient is without complaints other than noted in her HPI. All questions were answered.  All lab and sonogram results have been reviewed. Comments: Discussed IOL at 40 weeks. Wants membranes stripped at 39.6 if still pregnant  Assessment:  1.  Pregnancy at [redacted]w[redacted]d,  Estimated Date of Delivery: 09/01/14 :                          2.  A1DM,                         3.    Medication(s) Plans:  None now  Treatment Plan:  rx for glucometer.  Start QID testing  Follow up in 1 weeks for appointment for high risk OB care, EFW/AFI

## 2014-08-24 ENCOUNTER — Ambulatory Visit (INDEPENDENT_AMBULATORY_CARE_PROVIDER_SITE_OTHER): Payer: Medicaid Other | Admitting: Obstetrics and Gynecology

## 2014-08-24 ENCOUNTER — Ambulatory Visit (INDEPENDENT_AMBULATORY_CARE_PROVIDER_SITE_OTHER): Payer: Medicaid Other

## 2014-08-24 VITALS — BP 130/62 | HR 80 | Wt 203.0 lb

## 2014-08-24 DIAGNOSIS — O09893 Supervision of other high risk pregnancies, third trimester: Secondary | ICD-10-CM

## 2014-08-24 DIAGNOSIS — O24410 Gestational diabetes mellitus in pregnancy, diet controlled: Secondary | ICD-10-CM | POA: Diagnosis not present

## 2014-08-24 DIAGNOSIS — Z331 Pregnant state, incidental: Secondary | ICD-10-CM

## 2014-08-24 DIAGNOSIS — Z3A38 38 weeks gestation of pregnancy: Secondary | ICD-10-CM

## 2014-08-24 DIAGNOSIS — Z1389 Encounter for screening for other disorder: Secondary | ICD-10-CM

## 2014-08-24 LAB — POCT URINALYSIS DIPSTICK
Glucose, UA: NEGATIVE
Ketones, UA: NEGATIVE
Nitrite, UA: NEGATIVE
PROTEIN UA: NEGATIVE

## 2014-08-24 NOTE — Progress Notes (Signed)
Growth/Follow up OB US today at 38+[redacted] weeks GA.  Single, active female fetus in a cephalic presentation with FHR 136bpm. Anterior Gr 3 placenta. Fluid is normal with SVP 3.76cm and AFI 10.0cm. Spine visualized today and appears normal. 4 chamber heart and diaphragm remain not well visualized.  EFW of 3259g is consistent with dating at 43%,

## 2014-08-24 NOTE — Progress Notes (Signed)
Patient ID: Kimberly Olsen, female   DOB: 1990/11/15, 24 y.o.   MRN: [id number]  Fetal Surveillance Testing today:  U/S   High Risk Pregnancy Diagnosis(es):   A1DM  G2P1001 [redacted]w[redacted]d Estimated Date of Delivery: 09/01/14  Blood pressure 130/62, pulse 80, weight 203 lb (92.08 kg), last menstrual period 05/24/2013.  Urinalysis: trace blood, leukocytes 1+, otherwise negative  HPI: The patient is being seen today for ongoing management of A1DM. Today she reports no complaints. She states she has been checking her blood sugars, which haven't been over 100.    BP weight and urine results all reviewed and noted. Patient reports good fetal movement, denies any bleeding and no rupture of membranes symptoms or regular contractions.  Fundal Height: efw 46%per u/s normal af volume Fetal Heart rate:  145 Edema:  neg  Patient is without complaints other than noted in her HPI. All questions were answered.  All lab and sonogram results have been reviewed. Comments: normal   Assessment:  1.  Pregnancy at [redacted]w[redacted]d,  Estimated Date of Delivery: 09/01/14 :                          2.  A1DM, excellent diet control                        3.  Continue routine care, consider IOL at [place],   Medication(s) Plans:  n/a  Treatment Plan:  n/a  Follow up in 1 weeks for appointment for high risk OB care     This chart was SCRIBED for Mallory Shirk, MD by Stephania Fragmin, ED Scribe. This patient was seen in room 1 and the patient's care was started at 1:02 PM.  I personally performed the services described in this documentation, which was SCRIBED in my presence. The recorded information has been reviewed and considered accurate. It has been edited as necessary during review. Jonnie Kind, MD

## 2014-08-27 ENCOUNTER — Inpatient Hospital Stay (HOSPITAL_COMMUNITY)
Admission: AD | Admit: 2014-08-27 | Discharge: 2014-08-29 | DRG: 775 | Disposition: A | Discharge status: Home / Self Care | Payer: Medicaid Other | Source: Ambulatory Visit | Attending: Obstetrics and Gynecology | Admitting: Obstetrics and Gynecology

## 2014-08-27 ENCOUNTER — Encounter (HOSPITAL_COMMUNITY): Payer: Self-pay | Admitting: Certified Nurse Midwife

## 2014-08-27 DIAGNOSIS — O99324 Drug use complicating childbirth: Secondary | ICD-10-CM | POA: Diagnosis present

## 2014-08-27 DIAGNOSIS — O99824 Streptococcus B carrier state complicating childbirth: Secondary | ICD-10-CM | POA: Diagnosis present

## 2014-08-27 DIAGNOSIS — O24410 Gestational diabetes mellitus in pregnancy, diet controlled: Secondary | ICD-10-CM

## 2014-08-27 DIAGNOSIS — O24420 Gestational diabetes mellitus in childbirth, diet controlled: Principal | ICD-10-CM | POA: Diagnosis present

## 2014-08-27 DIAGNOSIS — IMO0001 Reserved for inherently not codable concepts without codable children: Secondary | ICD-10-CM

## 2014-08-27 DIAGNOSIS — O09893 Supervision of other high risk pregnancies, third trimester: Secondary | ICD-10-CM

## 2014-08-27 DIAGNOSIS — Z3A39 39 weeks gestation of pregnancy: Secondary | ICD-10-CM | POA: Diagnosis present

## 2014-08-27 DIAGNOSIS — F1290 Cannabis use, unspecified, uncomplicated: Secondary | ICD-10-CM | POA: Diagnosis present

## 2014-08-27 LAB — GLUCOSE, CAPILLARY: GLUCOSE-CAPILLARY: 88 mg/dL (ref 65–99)

## 2014-08-27 LAB — CBC
HCT: 33.8 % — ABNORMAL LOW (ref 36.0–46.0)
Hemoglobin: 11.3 g/dL — ABNORMAL LOW (ref 12.0–15.0)
MCH: 26.4 pg (ref 26.0–34.0)
MCHC: 33.4 g/dL (ref 30.0–36.0)
MCV: 79 fL (ref 78.0–100.0)
Platelets: 302 10*3/uL (ref 150–400)
RBC: 4.28 MIL/uL (ref 3.87–5.11)
RDW: 14.5 % (ref 11.5–15.5)
WBC: 18.8 10*3/uL — ABNORMAL HIGH (ref 4.0–10.5)

## 2014-08-27 MED ORDER — ONDANSETRON HCL 4 MG/2ML IJ SOLN: 4.0000 mg | Freq: Four times a day (QID) | INTRAMUSCULAR | Status: DC | PRN

## 2014-08-27 MED ORDER — WITCH HAZEL-GLYCERIN EX PADS: 1.0000 "application " | MEDICATED_PAD | CUTANEOUS | Status: DC | PRN

## 2014-08-27 MED ORDER — BENZOCAINE-MENTHOL 20-0.5 % EX AERO
1.0000 "application " | INHALATION_SPRAY | CUTANEOUS | Status: DC | PRN
  Filled 2014-08-27 (×2): qty 56

## 2014-08-27 MED ORDER — LANOLIN HYDROUS EX OINT: TOPICAL_OINTMENT | CUTANEOUS | Status: DC | PRN

## 2014-08-27 MED ORDER — LACTATED RINGERS IV SOLN: 500.0000 mL | INTRAVENOUS | Status: DC | PRN

## 2014-08-27 MED ORDER — OXYCODONE-ACETAMINOPHEN 5-325 MG PO TABS: 1.0000 | ORAL_TABLET | ORAL | Status: DC | PRN

## 2014-08-27 MED ORDER — SODIUM CHLORIDE 0.9 % IJ SOLN: 3.0000 mL | Freq: Two times a day (BID) | INTRAMUSCULAR | Status: DC

## 2014-08-27 MED ORDER — EPHEDRINE 5 MG/ML INJ: 10.0000 mg | INTRAVENOUS | Status: DC | PRN

## 2014-08-27 MED ORDER — DIBUCAINE 1 % RE OINT
1.0000 "application " | TOPICAL_OINTMENT | RECTAL | Status: DC | PRN
  Filled 2014-08-27: qty 28

## 2014-08-27 MED ORDER — SODIUM CHLORIDE 0.9 % IV SOLN: 250.0000 mL | INTRAVENOUS | Status: DC | PRN

## 2014-08-27 MED ORDER — FENTANYL 2.5 MCG/ML BUPIVACAINE 1/10 % EPIDURAL INFUSION (WH - ANES): 14.0000 mL/h | INTRAMUSCULAR | Status: DC | PRN

## 2014-08-27 MED ORDER — SODIUM CHLORIDE 0.9 % IV SOLN
2.0000 g | Freq: Once | INTRAVENOUS | Status: AC
  Administered 2014-08-27: 2 g via INTRAVENOUS
  Filled 2014-08-27: qty 2000

## 2014-08-27 MED ORDER — ONDANSETRON HCL 4 MG/2ML IJ SOLN: 4.0000 mg | INTRAMUSCULAR | Status: DC | PRN

## 2014-08-27 MED ORDER — CITRIC ACID-SODIUM CITRATE 334-500 MG/5ML PO SOLN: 30.0000 mL | ORAL | Status: DC | PRN

## 2014-08-27 MED ORDER — ACETAMINOPHEN 325 MG PO TABS
650.0000 mg | ORAL_TABLET | ORAL | Status: DC | PRN
  Administered 2014-08-28 (×3): 650 mg via ORAL
  Filled 2014-08-27 (×3): qty 2

## 2014-08-27 MED ORDER — OXYTOCIN 40 UNITS IN LACTATED RINGERS INFUSION - SIMPLE MED: 62.5000 mL/h | INTRAVENOUS | Status: DC | PRN

## 2014-08-27 MED ORDER — PHENYLEPHRINE 40 MCG/ML (10ML) SYRINGE FOR IV PUSH (FOR BLOOD PRESSURE SUPPORT): 80.0000 ug | PREFILLED_SYRINGE | INTRAVENOUS | Status: DC | PRN

## 2014-08-27 MED ORDER — DIPHENHYDRAMINE HCL 25 MG PO CAPS: 25.0000 mg | ORAL_CAPSULE | Freq: Four times a day (QID) | ORAL | Status: DC | PRN

## 2014-08-27 MED ORDER — FENTANYL CITRATE (PF) 100 MCG/2ML IJ SOLN
100.0000 ug | INTRAMUSCULAR | Status: DC | PRN
  Administered 2014-08-27: 100 ug via INTRAVENOUS
  Filled 2014-08-27: qty 2

## 2014-08-27 MED ORDER — OXYCODONE-ACETAMINOPHEN 5-325 MG PO TABS: 2.0000 | ORAL_TABLET | ORAL | Status: DC | PRN

## 2014-08-27 MED ORDER — LACTATED RINGERS IV SOLN
INTRAVENOUS | Status: DC
  Administered 2014-08-27: 22:00:00 via INTRAVENOUS

## 2014-08-27 MED ORDER — LIDOCAINE HCL (PF) 1 % IJ SOLN
30.0000 mL | INTRAMUSCULAR | Status: DC | PRN
  Filled 2014-08-27: qty 30

## 2014-08-27 MED ORDER — SIMETHICONE 80 MG PO CHEW: 80.0000 mg | CHEWABLE_TABLET | ORAL | Status: DC | PRN

## 2014-08-27 MED ORDER — OXYTOCIN 40 UNITS IN LACTATED RINGERS INFUSION - SIMPLE MED
62.5000 mL/h | INTRAVENOUS | Status: DC
Start: 2014-08-27 — End: 2014-08-27
  Administered 2014-08-27: 62.5 mL/h via INTRAVENOUS
  Filled 2014-08-27: qty 1000

## 2014-08-27 MED ORDER — DIPHENHYDRAMINE HCL 50 MG/ML IJ SOLN: 12.5000 mg | INTRAMUSCULAR | Status: DC | PRN

## 2014-08-27 MED ORDER — OXYTOCIN BOLUS FROM INFUSION: 500.0000 mL | INTRAVENOUS | Status: DC

## 2014-08-27 MED ORDER — IBUPROFEN 600 MG PO TABS
600.0000 mg | ORAL_TABLET | Freq: Four times a day (QID) | ORAL | Status: DC
  Administered 2014-08-28 – 2014-08-29 (×7): 600 mg via ORAL
  Filled 2014-08-27 (×7): qty 1

## 2014-08-27 MED ORDER — ONDANSETRON HCL 4 MG PO TABS: 4.0000 mg | ORAL_TABLET | ORAL | Status: DC | PRN

## 2014-08-27 MED ORDER — ZOLPIDEM TARTRATE 5 MG PO TABS: 5.0000 mg | ORAL_TABLET | Freq: Every evening | ORAL | Status: DC | PRN

## 2014-08-27 MED ORDER — BISACODYL 10 MG RE SUPP
10.0000 mg | Freq: Every day | RECTAL | Status: DC | PRN
  Filled 2014-08-27: qty 1

## 2014-08-27 MED ORDER — SODIUM CHLORIDE 0.9 % IJ SOLN: 3.0000 mL | INTRAMUSCULAR | Status: DC | PRN

## 2014-08-27 MED ORDER — SENNOSIDES-DOCUSATE SODIUM 8.6-50 MG PO TABS
2.0000 | ORAL_TABLET | ORAL | Status: DC
  Administered 2014-08-28 (×2): 2 via ORAL
  Filled 2014-08-27 (×2): qty 2

## 2014-08-27 MED ORDER — FLEET ENEMA 7-19 GM/118ML RE ENEM: 1.0000 | ENEMA | Freq: Every day | RECTAL | Status: DC | PRN

## 2014-08-27 MED ORDER — OXYCODONE-ACETAMINOPHEN 5-325 MG PO TABS
2.0000 | ORAL_TABLET | Freq: Once | ORAL | Status: AC
  Administered 2014-08-27: 2 via ORAL
  Filled 2014-08-27: qty 2

## 2014-08-27 MED ORDER — PRENATAL MULTIVITAMIN CH
1.0000 | ORAL_TABLET | Freq: Every day | ORAL | Status: DC
  Administered 2014-08-28 – 2014-08-29 (×2): 1 via ORAL
  Filled 2014-08-27 (×2): qty 1

## 2014-08-27 MED ORDER — ACETAMINOPHEN 325 MG PO TABS
650.0000 mg | ORAL_TABLET | ORAL | Status: DC | PRN
Start: 2014-08-27 — End: 2014-08-27

## 2014-08-27 NOTE — MAU Note (Signed)
Due to volume of pts in MAU, spoke with Hinton Dyer RN in [facility] and pt to 168 for labor eval.

## 2014-08-27 NOTE — H&P (Signed)
LABOR ADMISSION HISTORY AND PHYSICAL  Kimberly Olsen is a 24 y.o. female G2P1001 with IUP at [redacted]w[redacted]d by [redacted]w[redacted]d dating sono presenting for labor eval, SROM prior to second exam. She reports +FMs, No LOF, no VB, no blurry vision, headaches or peripheral edema, and RUQ pain.  She plans on brest feeding. She request OCPs for birth control.  Dating: By [redacted]w[redacted]d dating sono --->  Estimated Date of Delivery: 09/01/14   Prenatal History/Complications:  Past Medical History: Past Medical History  Diagnosis Date  . Anemia   . Anxiety   . Depression     Past Surgical History: Past Surgical History  Procedure Laterality Date  . Oophorectomy      Obstetrical History: OB History    Gravida Para Term Preterm AB TAB SAB Ectopic Multiple Living   2 1 1       1       Social History: History   Social History  . Marital Status: Married    Spouse Name: N/A  . Number of Children: N/A  . Years of Education: N/A   Social History Main Topics  . Smoking status: Never Smoker   . Smokeless tobacco: Never Used  . Alcohol Use: No  . Drug Use: No     Comment: not with pregnancy per pt  . Sexual Activity: Yes    Birth Control/ Protection: None   Other Topics Concern  . None   Social History Narrative    Family History: Family History  Problem Relation Age of Onset  . Anesthesia problems Neg Hx   . Hypotension Neg Hx   . Malignant hyperthermia Neg Hx   . Pseudochol deficiency Neg Hx   . Depression Mother   . Asthma Mother   . Mental illness Mother   . Depression Father   . Mental illness Father     Allergies: Allergies  Allergen Reactions  . Coconut Fatty Acids Hives  . Sulfa Antibiotics Hives and Rash    Prescriptions prior to admission  Medication Sig Dispense Refill Last Dose  . acetaminophen (TYLENOL) 325 MG tablet Take 650 mg by mouth every 6 (six) hours as needed.   prn at prn  . omeprazole (PRILOSEC) 20 MG capsule Take 1 capsule (20 mg total) by mouth daily. 30 capsule 4  08/26/2014  . Prenatal Vit-Fe Fumarate-FA (PRENATAL MULTIVITAMIN) TABS Take 1 tablet by mouth daily.   Past Week at Unknown time     Review of Systems   All systems reviewed and negative except as stated in HPI  Blood pressure 141/83, pulse 82, temperature 97.6 F (36.4 C), temperature source Oral, resp. rate 18, height 5\' 7"  (1.702 m), weight 205 lb (92.987 kg), last menstrual period 05/24/2013. General appearance: alert and cooperative Lungs: clear to auscultation bilaterally Heart: regular rate and rhythm Abdomen: soft, non-tender; bowel sounds normal Pelvic: adequate Extremities: Homans sign is negative, no sign of DVT DTR's 2+ Presentation: cephalic  Dilation: 7 Effacement (%): 100 Station: +1 Exam by:: A. Durene Romans, RNC    Prenatal labs: ABO, Rh: O/POS/-- (11/03 1633) Antibody: Negative (03/28 0924) Rubella:   RPR: Non Reactive (03/28 0924)  HBsAg: NEGATIVE (11/03 1633)  HIV: NONREACTIVE (11/03 1633)  GBS: Positive (05/27 0000)  1 hr Glucola 80/213/142 Genetic screening  Normal NT/IT Anatomy US normal   [place], [place], 2nd baby, married  Dating By 6wk u/s  Pap 12/10/13 @ [facility]: neg (h/o abnormals)  GC/CT Initial:    -/-  36+wks:-/-  Genetic Screen NT/IT: negatiave  CF screen declined  Anatomic [place] Normal   Flu vaccine 01/18/14  Tdap Recommended ~ 28wks  Glucose Screen  2 hr  80/213/142  GBS positive  Feed Preference breast  Contraception pills  Circumcision   Childbirth Classes   Pediatrician       Prenatal Transfer Tool  Maternal Diabetes: Yes:  Diabetes Type:  Diet controlled Genetic Screening: Normal Maternal Ultrasounds/Referrals: Normal Fetal Ultrasounds or other Referrals:  None Maternal Substance Abuse:  No Significant Maternal Medications:  None Significant Maternal Lab Results: Lab values include: Group B Strep positive  No results found for this or any previous visit (from the past 24  hour(s)).  Patient Active Problem List   Diagnosis Date Noted  . Active labor 08/27/2014  . Gestational diabetes mellitus, class A1 08/17/2014  . Marijuana use 01/19/2014  . Supervision of other high-risk pregnancy 01/18/2014    Assessment: Kimberly Olsen is a 24 y.o. G2P1001 at [redacted]w[redacted]d with A1DM here for in active labor  #Labor:expectant mgmt #Pain: Epidural upon request #FWB: Cat I #ID:   GBS pos => ampicillin as now ruptured #MOF: breast #MOC:OCPs #A1DM: CBG  Kimberly Olsen 08/27/2014, 10:07 PM

## 2014-08-28 ENCOUNTER — Encounter (HOSPITAL_COMMUNITY): Payer: Self-pay | Admitting: *Deleted

## 2014-08-28 LAB — RPR: RPR: NONREACTIVE

## 2014-08-28 LAB — HIV ANTIBODY (ROUTINE TESTING W REFLEX): HIV SCREEN 4TH GENERATION: NONREACTIVE

## 2014-08-28 LAB — ABO/RH: ABO/RH(D): O POS

## 2014-08-28 NOTE — Progress Notes (Signed)
Post Partum Day 1  Subjective:  Kimberly Olsen is a 24 y.o. [id number] [redacted]w[redacted]d s/p SVD.  No acute events overnight.  Pt denies problems with ambulating, voiding or po intake.  She denies nausea or vomiting.  Pain is well controlled.  She has had flatus. She has not had bowel movement.  Lochia Small.  Plan for birth control is oral contraceptives (estrogen/progesterone).  Method of Feeding: bottle.  Patient has no concerns this morning. Does want to go home today if possible.  Objective: BP 117/70 mmHg  Pulse 63  Temp(Src) 98.3 F (36.8 C) (Oral)  Resp 18  Ht 5\' 7"  (1.702 m)  Wt 205 lb (92.987 kg)  BMI 32.10 kg/m2  SpO2 96%  LMP 05/24/2013  Breastfeeding? Unknown  Physical Exam:  General: alert, cooperative and no distress Lochia:normal flow Chest: CTAB, normal WOB Heart: RRR no m/r/g Abdomen: +BS, soft, nontender Uterine Fundus: firm DVT Evaluation: No evidence of DVT seen on physical exam. Extremities: no edema   Recent Labs  08/27/14 2220  HGB 11.3*  HCT 33.8*    Assessment/Plan:  ASSESSMENT: Kimberly Olsen is a 24 y.o. G2P2002 [redacted]w[redacted]d ppd #1 s/p NSVD doing well.   Plan for discharge tomorrow   - most likely will not be able to discharge today due to 24/hr pp would be at ~11pm tonight.  Continue routine post-partum care   LOS: 1 day   Luiz Blare, DO 08/28/2014, 7:54 AM PGY-1, [place]

## 2014-08-28 NOTE — Progress Notes (Signed)
CLINICAL SOCIAL WORK MATERNAL/CHILD NOTE  Patient Details  Name: Kimberly Olsen MRN: [id number] Date of Birth: 08/27/2016  Date: 08/28/2014  Clinical Social Worker Initiating Note: Norlene Duel, LCSWDate/ Time Initiated: 08/28/14/1100   Child's Name: Kimberly Olsen [facility]   Legal Guardian:  (Parents Alandria and Kemah)   Need for Interpreter: None   Date of Referral: 08/27/14   Reason for Referral: Other (Comment)   Referral Source: [facility]   Address: [place], [place] [address]  Phone number:  [phone])   Household Members: Spouse   Natural Supports (not living in the home): Extended Family   Professional Supports:None   Employment: (Both parents are employed)   Type of Work:     Education:     Museum/gallery curator Resources:Medicaid   Other Resources: [employer], Physicist, medical    Cultural/Religious Considerations Which May Impact Care: none noted  Strengths: Ability to meet basic needs , Home prepared for child    Risk Factors/Current Problems:     Cognitive State: Alert , Able to Concentrate    Mood/Affect: Happy , Relaxed , Bright    CSW Assessment: Acknowledged order for Social Work consult to assess mother's history of substance abuse and mental illness. MOB was pleasant and receptive to CSW. Parents are married and have one other dependent age 46. Spouse was present and very attentive to newborn. Mother states that she was treated for depression and anxiety during her teenage years until age 24. Informed that she was prescribed medication and went to therapy. MOB reports history of 2 psychiatric hospitalizations. She was hospitalized at age 24 and again at age 24. She denies an hx of PP Depression. Mother states that she has not experienced a significant return of symptoms since age 24. She admits to occasional use of marijuana in the beginning of the pregnancy because she was  very sick and it helped with nausea, pain and insomnia. UDS on newborn is pending. She denies any need for treatment. Mother reports no current symptoms of depression or anxiety. No acute social concerns related at this time. Mother informed of social work Fish farm manager.   CSW Plan/Description:    Spoke with her about the signs/symptoms of PP Depression, and provided information and literature on PP Depression Informed her of the hospital's drug screen policy No current barriers to discharge Will continue to monitor drug screen    Arielis Leonhart J, LCSW 08/28/2014, 3:34 PM

## 2014-08-29 ENCOUNTER — Ambulatory Visit: Payer: Self-pay

## 2014-08-29 MED ORDER — IBUPROFEN 600 MG PO TABS: 600.0000 mg | ORAL_TABLET | Freq: Four times a day (QID) | ORAL | Status: DC

## 2014-08-29 NOTE — Lactation Note (Signed)
This note was copied from the chart of Kimberly Olsen. Lactation Consultation Note: Mother has attempt to breastfeed infant 2 times since birth. She has been bottle feeding. Mother was given Lactation brochure and offered assistance as needed. Mother request a hand pump. She was given a hand pump with instructions. She was informed of different flange sizes as needed. She states she attempt to breastfeed her first child for only a week. Mother is still unsure if she wants to provide breastmilk to her infant. She is active with [facility] and has an appt on Wednesday June 15. Mother states that she has no difficulties latching infant. She will page staff nurse or [place] if needed.   Patient Name: Kimberly Maureena Dabbs [id number] Date: 08/29/2014 Reason for consult: Initial assessment   Maternal Data Has patient been taught Hand Expression?: Yes Does the patient have breastfeeding experience prior to this delivery?:  (attempt with first child for one week)  Feeding    LATCH Score/Interventions                      Lactation Tools Discussed/Used     Consult Status Consult Status: PRN    Darla Lesches 08/29/2014, 2:59 PM

## 2014-08-29 NOTE — Discharge Summary (Signed)
Obstetric Discharge Summary Reason for Admission: onset of labor Prenatal Procedures: ultrasound Intrapartum Procedures: spontaneous vaginal delivery Postpartum Procedures: none Complications-Operative and Postpartum: none HEMOGLOBIN  Date Value Ref Range Status  08/27/2014 11.3* 12.0 - 15.0 g/dL Final   HCT  Date Value Ref Range Status  08/27/2014 33.8* 36.0 - 46.0 % Final    Physical Exam:  General: alert, cooperative, appears stated age and no distress Lochia: appropriate Uterine Fundus: firm Incision: healing well DVT Evaluation: No evidence of DVT seen on physical exam. Negative Homan's sign. No cords or calf tenderness.  Discharge Diagnoses: Term Pregnancy-delivered  Discharge Information: Date: 08/29/2014 Activity: pelvic rest Diet: routine Medications: PNV, Ibuprofen and Percocet Condition: stable and improved Instructions: refer to practice specific booklet Discharge to: home   Newborn Data: Live born female  Birth Weight: 7 lb 6.6 oz (3362 g) APGAR: 8, 9  Home with mother.  Koren Shiver DARLENE 08/29/2014, 6:46 AM

## 2014-08-31 ENCOUNTER — Encounter: Payer: Medicaid Other | Admitting: Advanced Practice Midwife

## 2014-09-12 ENCOUNTER — Telehealth: Payer: Self-pay | Admitting: *Deleted

## 2014-09-12 NOTE — Telephone Encounter (Signed)
Pt requesting medication for breast milk production. Informed pt she could take Fenugreek and informed pt that milk production increases with the more she pumps and breast feeds.

## 2014-10-12 ENCOUNTER — Encounter: Payer: Self-pay | Admitting: *Deleted

## 2014-10-12 ENCOUNTER — Ambulatory Visit: Payer: Medicaid Other | Admitting: Advanced Practice Midwife

## 2014-10-19 ENCOUNTER — Ambulatory Visit: Payer: Medicaid Other | Admitting: Advanced Practice Midwife

## 2014-10-28 ENCOUNTER — Ambulatory Visit: Payer: Medicaid Other | Admitting: Obstetrics & Gynecology

## 2014-12-23 ENCOUNTER — Ambulatory Visit: Payer: Medicaid Other | Admitting: "Endocrinology

## 2015-03-28 ENCOUNTER — Other Ambulatory Visit (HOSPITAL_COMMUNITY)
Admission: RE | Admit: 2015-03-28 | Discharge: 2015-03-28 | Disposition: A | Discharge status: Home / Self Care | Payer: BLUE CROSS/BLUE SHIELD | Source: Ambulatory Visit | Attending: Unknown Physician Specialty | Admitting: Unknown Physician Specialty

## 2015-03-28 DIAGNOSIS — R87619 Unspecified abnormal cytological findings in specimens from cervix uteri: Secondary | ICD-10-CM | POA: Diagnosis not present

## 2017-03-18 NOTE — L&D Delivery Note (Signed)
Delivery Note At 11:39 AM a viable female was delivered via  (Presentation:LOT ;LOA  ).  APGAR:9 ,9 ; weight 3260g .   Placenta status:intact, removed with gentle cord traction and maternal effort .  Cord: three vessels with the following complications: cord loosely wrapped around back of neck, not a complete loop .    Anesthesia: none, IV analgesia  Episiotomy: None Lacerations: none  Est. Blood Loss (mL):  250  Mom to postpartum.  Baby to Couplet care / Skin to Skin.  Darlina Rumpf, CNM 03/06/2018, [phone]

## 2017-07-18 ENCOUNTER — Encounter: Payer: Self-pay | Admitting: Adult Health

## 2017-07-18 ENCOUNTER — Encounter (INDEPENDENT_AMBULATORY_CARE_PROVIDER_SITE_OTHER): Payer: Self-pay

## 2017-07-18 ENCOUNTER — Ambulatory Visit (INDEPENDENT_AMBULATORY_CARE_PROVIDER_SITE_OTHER): Payer: Medicaid Other | Admitting: Adult Health

## 2017-07-18 VITALS — BP 110/70 | HR 96 | Ht 67.2 in | Wt 203.0 lb

## 2017-07-18 DIAGNOSIS — Z3A01 Less than 8 weeks gestation of pregnancy: Secondary | ICD-10-CM | POA: Insufficient documentation

## 2017-07-18 DIAGNOSIS — O3680X0 Pregnancy with inconclusive fetal viability, not applicable or unspecified: Secondary | ICD-10-CM

## 2017-07-18 DIAGNOSIS — G4700 Insomnia, unspecified: Secondary | ICD-10-CM | POA: Diagnosis not present

## 2017-07-18 DIAGNOSIS — O219 Vomiting of pregnancy, unspecified: Secondary | ICD-10-CM

## 2017-07-18 DIAGNOSIS — Z3201 Encounter for pregnancy test, result positive: Secondary | ICD-10-CM | POA: Diagnosis not present

## 2017-07-18 DIAGNOSIS — R112 Nausea with vomiting, unspecified: Secondary | ICD-10-CM

## 2017-07-18 DIAGNOSIS — N926 Irregular menstruation, unspecified: Secondary | ICD-10-CM | POA: Diagnosis not present

## 2017-07-18 LAB — POCT URINE PREGNANCY: Preg Test, Ur: POSITIVE — AB

## 2017-07-18 MED ORDER — PROMETHAZINE HCL 25 MG PO TABS: 25.0000 mg | ORAL_TABLET | Freq: Four times a day (QID) | ORAL | 1 refills | Status: DC | PRN

## 2017-07-18 MED ORDER — PRENATAL PLUS 27-1 MG PO TABS: 1.0000 | ORAL_TABLET | Freq: Every day | ORAL | 12 refills | Status: DC

## 2017-07-18 NOTE — Patient Instructions (Signed)
First Trimester of Pregnancy The first trimester of pregnancy is from week 1 until the end of week 13 (months 1 through 3). A week after a sperm fertilizes an egg, the egg will implant on the wall of the uterus. This embryo will begin to develop into a baby. Genes from you and your partner will form the baby. The female genes will determine whether the baby will be a boy or a girl. At 6-8 weeks, the eyes and face will be formed, and the heartbeat can be seen on ultrasound. At the end of 12 weeks, all the baby's organs will be formed. Now that you are pregnant, you will want to do everything you can to have a healthy baby. Two of the most important things are to get good prenatal care and to follow your health care provider's instructions. Prenatal care is all the medical care you receive before the baby's birth. This care will help prevent, find, and treat any problems during the pregnancy and childbirth. Body changes during your first trimester Your body goes through many changes during pregnancy. The changes vary from woman to woman.  You may gain or lose a couple of pounds at first.  You may feel sick to your stomach (nauseous) and you may throw up (vomit). If the vomiting is uncontrollable, call your health care provider.  You may tire easily.  You may develop headaches that can be relieved by medicines. All medicines should be approved by your health care provider.  You may urinate more often. Painful urination may mean you have a bladder infection.  You may develop heartburn as a result of your pregnancy.  You may develop constipation because certain hormones are causing the muscles that push stool through your intestines to slow down.  You may develop hemorrhoids or swollen veins (varicose veins).  Your breasts may begin to grow larger and become tender. Your nipples may stick out more, and the tissue that surrounds them (areola) may become darker.  Your gums may bleed and may be  sensitive to brushing and flossing.  Dark spots or blotches (chloasma, mask of pregnancy) may develop on your face. This will likely fade after the baby is born.  Your menstrual periods will stop.  You may have a loss of appetite.  You may develop cravings for certain kinds of food.  You may have changes in your emotions from day to day, such as being excited to be pregnant or being concerned that something may go wrong with the pregnancy and baby.  You may have more vivid and strange dreams.  You may have changes in your hair. These can include thickening of your hair, rapid growth, and changes in texture. Some women also have hair loss during or after pregnancy, or hair that feels dry or thin. Your hair will most likely return to normal after your baby is born.  What to expect at prenatal visits During a routine prenatal visit:  You will be weighed to make sure you and the baby are growing normally.  Your blood pressure will be taken.  Your abdomen will be measured to track your baby's growth.  The fetal heartbeat will be listened to between weeks 10 and 14 of your pregnancy.  Test results from any previous visits will be discussed.  Your health care provider may ask you:  How you are feeling.  If you are feeling the baby move.  If you have had any abnormal symptoms, such as leaking fluid, bleeding, severe headaches,   or abdominal cramping.  If you are using any tobacco products, including cigarettes, chewing tobacco, and electronic cigarettes.  If you have any questions.  Other tests that may be performed during your first trimester include:  Blood tests to find your blood type and to check for the presence of any previous infections. The tests will also be used to check for low iron levels (anemia) and protein on red blood cells (Rh antibodies). Depending on your risk factors, or if you previously had diabetes during pregnancy, you may have tests to check for high blood  sugar that affects pregnant women (gestational diabetes).  Urine tests to check for infections, diabetes, or protein in the urine.  An ultrasound to confirm the proper growth and development of the baby.  Fetal screens for spinal cord problems (spina bifida) and Down syndrome.  HIV (human immunodeficiency virus) testing. Routine prenatal testing includes screening for HIV, unless you choose not to have this test.  You may need other tests to make sure you and the baby are doing well.  Follow these instructions at home: Medicines  Follow your health care provider's instructions regarding medicine use. Specific medicines may be either safe or unsafe to take during pregnancy.  Take a prenatal vitamin that contains at least 600 micrograms (mcg) of folic acid.  If you develop constipation, try taking a stool softener if your health care provider approves. Eating and drinking  Eat a balanced diet that includes fresh fruits and vegetables, whole grains, good sources of protein such as meat, eggs, or tofu, and low-fat dairy. Your health care provider will help you determine the amount of weight gain that is right for you.  Avoid raw meat and uncooked cheese. These carry germs that can cause birth defects in the baby.  Eating four or five small meals rather than three large meals a day may help relieve nausea and vomiting. If you start to feel nauseous, eating a few soda crackers can be helpful. Drinking liquids between meals, instead of during meals, also seems to help ease nausea and vomiting.  Limit foods that are high in fat and processed sugars, such as fried and sweet foods.  To prevent constipation: ? Eat foods that are high in fiber, such as fresh fruits and vegetables, whole grains, and beans. ? Drink enough fluid to keep your urine clear or pale yellow. Activity  Exercise only as directed by your health care provider. Most women can continue their usual exercise routine during  pregnancy. Try to exercise for 30 minutes at least 5 days a week. Exercising will help you: ? Control your weight. ? Stay in shape. ? Be prepared for labor and delivery.  Experiencing pain or cramping in the lower abdomen or lower back is a good sign that you should stop exercising. Check with your health care provider before continuing with normal exercises.  Try to avoid standing for long periods of time. Move your legs often if you must stand in one place for a long time.  Avoid heavy lifting.  Wear low-heeled shoes and practice good posture.  You may continue to have sex unless your health care provider tells you not to. Relieving pain and discomfort  Wear a good support bra to relieve breast tenderness.  Take warm sitz baths to soothe any pain or discomfort caused by hemorrhoids. Use hemorrhoid cream if your health care provider approves.  Rest with your legs elevated if you have leg cramps or low back pain.  If you develop   varicose veins in your legs, wear support hose. Elevate your feet for 15 minutes, 3-4 times a day. Limit salt in your diet. Prenatal care  Schedule your prenatal visits by the twelfth week of pregnancy. They are usually scheduled monthly at first, then more often in the last 2 months before delivery.  Write down your questions. Take them to your prenatal visits.  Keep all your prenatal visits as told by your health care provider. This is important. Safety  Wear your seat belt at all times when driving.  Make a list of emergency phone numbers, including numbers for family, friends, the hospital, and police and fire departments. General instructions  Ask your health care provider for a referral to a local prenatal education class. Begin classes no later than the beginning of month 6 of your pregnancy.  Ask for help if you have counseling or nutritional needs during pregnancy. Your health care provider can offer advice or refer you to specialists for help  with various needs.  Do not use hot tubs, steam rooms, or saunas.  Do not douche or use tampons or scented sanitary pads.  Do not cross your legs for long periods of time.  Avoid cat litter boxes and soil used by cats. These carry germs that can cause birth defects in the baby and possibly loss of the fetus by miscarriage or stillbirth.  Avoid all smoking, herbs, alcohol, and medicines not prescribed by your health care provider. Chemicals in these products affect the formation and growth of the baby.  Do not use any products that contain nicotine or tobacco, such as cigarettes and e-cigarettes. If you need help quitting, ask your health care provider. You may receive counseling support and other resources to help you quit.  Schedule a dentist appointment. At home, brush your teeth with a soft toothbrush and be gentle when you floss. Contact a health care provider if:  You have dizziness.  You have mild pelvic cramps, pelvic pressure, or nagging pain in the abdominal area.  You have persistent nausea, vomiting, or diarrhea.  You have a bad smelling vaginal discharge.  You have pain when you urinate.  You notice increased swelling in your face, hands, legs, or ankles.  You are exposed to fifth disease or chickenpox.  You are exposed to German measles (rubella) and have never had it. Get help right away if:  You have a fever.  You are leaking fluid from your vagina.  You have spotting or bleeding from your vagina.  You have severe abdominal cramping or pain.  You have rapid weight gain or loss.  You vomit blood or material that looks like coffee grounds.  You develop a severe headache.  You have shortness of breath.  You have any kind of trauma, such as from a fall or a car accident. Summary  The first trimester of pregnancy is from week 1 until the end of week 13 (months 1 through 3).  Your body goes through many changes during pregnancy. The changes vary from  woman to woman.  You will have routine prenatal visits. During those visits, your health care provider will examine you, discuss any test results you may have, and talk with you about how you are feeling. This information is not intended to replace advice given to you by your health care provider. Make sure you discuss any questions you have with your health care provider. Document Released: 02/26/2001 Document Revised: 02/14/2016 Document Reviewed: 02/14/2016 Elsevier Interactive Patient Education  2018 [employer]  

## 2017-07-18 NOTE — Progress Notes (Signed)
  Subjective:     Patient ID: Kimberly Olsen, female   DOB: Dec 14, 1990, 27 y.o.   MRN: [id number]  HPI Kimberly Olsen is a 27 year old white female, in for UPT, has missed a period and had +HPT.She is having nausea, vomiting and insomnia.   Review of Systems  +missed period with +HPT +nausea and vomiting +insomnia  Reviewed past medical,surgical, social and family history. Reviewed medications and allergies.     Objective:   Physical Exam BP 110/70 (BP Location: Left Arm, Patient Position: Sitting, Cuff Size: Small)   Pulse 96   Ht 5' 7.2" (1.707 m)   Wt 203 lb (92.1 kg)   LMP 06/02/2017   BMI 31.61 kg/m UPT +, about 6+4 weeks by LMP with EDD 03/09/18.Skin warm and dry. Neck: mid line trachea, normal thyroid, good ROM, no lymphadenopathy noted. Lungs: clear to ausculation bilaterally. Cardiovascular: regular rate and rhythm.Abdomen is soft and non tender.    Assessment:     1. Pregnancy test positive   2. Less than [redacted] weeks gestation of pregnancy   3. Encounter to determine fetal viability of pregnancy, single or unspecified fetus   4. Nausea and vomiting during pregnancy prior to [redacted] weeks gestation       Plan:     Meds ordered this encounter  Medications  . prenatal vitamin w/FE, FA (PRENATAL 1 + 1) 27-1 MG TABS tablet    Sig: Take 1 tablet by mouth daily at 12 noon.    Dispense:  30 each    Refill:  12    Order Specific Question:   Supervising Provider    Answer:   Elonda Husky, LUTHER H [[id number]]  . promethazine (PHENERGAN) 25 MG tablet    Sig: Take 1 tablet (25 mg total) by mouth every 6 (six) hours as needed for nausea or vomiting.    Dispense:  30 tablet    Refill:  1    Order Specific Question:   Supervising Provider    Answer:   Florian Buff [[id number]]  Return in 1 week for dating [place] Review handouts on First trimester and by [facility]

## 2017-07-24 ENCOUNTER — Ambulatory Visit (INDEPENDENT_AMBULATORY_CARE_PROVIDER_SITE_OTHER): Payer: Medicaid Other

## 2017-07-24 DIAGNOSIS — O3680X0 Pregnancy with inconclusive fetal viability, not applicable or unspecified: Secondary | ICD-10-CM | POA: Diagnosis not present

## 2017-07-24 DIAGNOSIS — Z3A01 Less than 8 weeks gestation of pregnancy: Secondary | ICD-10-CM | POA: Diagnosis not present

## 2017-07-24 NOTE — Progress Notes (Signed)
[place] 7+3 wks,single IUP,postivie fht 167 bpm,normal ovaries bilat,crl 14.9 mm,EDD 03/09/2018 BY LMP

## 2017-08-08 ENCOUNTER — Other Ambulatory Visit: Payer: Self-pay

## 2017-08-08 ENCOUNTER — Other Ambulatory Visit (HOSPITAL_COMMUNITY)
Admission: RE | Admit: 2017-08-08 | Discharge: 2017-08-08 | Disposition: A | Discharge status: Home / Self Care | Payer: Medicaid Other | Source: Ambulatory Visit | Attending: Obstetrics & Gynecology | Admitting: Obstetrics & Gynecology

## 2017-08-08 ENCOUNTER — Ambulatory Visit (INDEPENDENT_AMBULATORY_CARE_PROVIDER_SITE_OTHER): Payer: Medicaid Other | Admitting: Women's Health

## 2017-08-08 ENCOUNTER — Encounter: Payer: Self-pay | Admitting: Women's Health

## 2017-08-08 VITALS — BP 124/76 | HR 74 | Wt 195.0 lb

## 2017-08-08 DIAGNOSIS — O99321 Drug use complicating pregnancy, first trimester: Secondary | ICD-10-CM

## 2017-08-08 DIAGNOSIS — O09291 Supervision of pregnancy with other poor reproductive or obstetric history, first trimester: Secondary | ICD-10-CM | POA: Diagnosis not present

## 2017-08-08 DIAGNOSIS — O0990 Supervision of high risk pregnancy, unspecified, unspecified trimester: Secondary | ICD-10-CM | POA: Insufficient documentation

## 2017-08-08 DIAGNOSIS — Z3682 Encounter for antenatal screening for nuchal translucency: Secondary | ICD-10-CM

## 2017-08-08 DIAGNOSIS — Z124 Encounter for screening for malignant neoplasm of cervix: Secondary | ICD-10-CM

## 2017-08-08 DIAGNOSIS — Z3A09 9 weeks gestation of pregnancy: Secondary | ICD-10-CM

## 2017-08-08 DIAGNOSIS — N898 Other specified noninflammatory disorders of vagina: Secondary | ICD-10-CM | POA: Diagnosis not present

## 2017-08-08 DIAGNOSIS — Z331 Pregnant state, incidental: Secondary | ICD-10-CM

## 2017-08-08 DIAGNOSIS — O219 Vomiting of pregnancy, unspecified: Secondary | ICD-10-CM

## 2017-08-08 DIAGNOSIS — O26891 Other specified pregnancy related conditions, first trimester: Secondary | ICD-10-CM

## 2017-08-08 DIAGNOSIS — F418 Other specified anxiety disorders: Secondary | ICD-10-CM

## 2017-08-08 DIAGNOSIS — Z3481 Encounter for supervision of other normal pregnancy, first trimester: Secondary | ICD-10-CM | POA: Diagnosis present

## 2017-08-08 DIAGNOSIS — Z8632 Personal history of gestational diabetes: Secondary | ICD-10-CM | POA: Insufficient documentation

## 2017-08-08 DIAGNOSIS — F1290 Cannabis use, unspecified, uncomplicated: Secondary | ICD-10-CM

## 2017-08-08 DIAGNOSIS — O99341 Other mental disorders complicating pregnancy, first trimester: Secondary | ICD-10-CM

## 2017-08-08 DIAGNOSIS — Z1389 Encounter for screening for other disorder: Secondary | ICD-10-CM

## 2017-08-08 LAB — POCT WET PREP (WET MOUNT)
CLUE CELLS WET PREP WHIFF POC: NEGATIVE
Trichomonas Wet Prep HPF POC: ABSENT

## 2017-08-08 LAB — POCT URINALYSIS DIPSTICK
Blood, UA: NEGATIVE
Glucose, UA: NEGATIVE
LEUKOCYTES UA: NEGATIVE
NITRITE UA: NEGATIVE
PROTEIN UA: NEGATIVE

## 2017-08-08 MED ORDER — DOXYLAMINE-PYRIDOXINE 10-10 MG PO TBEC: DELAYED_RELEASE_TABLET | ORAL | 6 refills | Status: DC

## 2017-08-08 MED ORDER — SERTRALINE HCL 25 MG PO TABS: 25.0000 mg | ORAL_TABLET | Freq: Every day | ORAL | 6 refills | Status: DC

## 2017-08-08 NOTE — Progress Notes (Signed)
INITIAL OBSTETRICAL VISIT Patient name: Kimberly Olsen MRN [id number]  Date of birth: 03/08/1991 Chief Complaint:   Initial Prenatal Visit  History of Present Illness:   Kimberly Olsen is a 27 y.o. G68P2002 Caucasian female at [redacted]w[redacted]d by LMP c/w 7wk u/s, with an Estimated Date of Delivery: 03/09/18 being seen today for her initial obstetrical visit.   Her obstetrical history is significant for term svb x 2, A1DM last pregnancy.   Today she reports n/v- phenergan not helping. Dep/anx- no meds currently, has been on klonopin and abilify in past- feels she needs to start back on something, also wants counseling. Denies SI/HI. Vag d/c w/ itching, bleeding after sex. Uses THC occ to help w/ n/v. Needs deep cleaning w/ dentist, needs note.  Patient's last menstrual period was 06/02/2017. Last pap >67yrs ago. Results were: normal Review of Systems:   Pertinent items are noted in HPI Denies cramping/contractions, leakage of fluid, vaginal bleeding, abnormal vaginal discharge w/ itching/odor/irritation, headaches, visual changes, shortness of breath, chest pain, abdominal pain, severe nausea/vomiting, or problems with urination or bowel movements unless otherwise stated above.  Pertinent History Reviewed:  Reviewed past medical,surgical, social, obstetrical and family history.  Reviewed problem list, medications and allergies. OB History  Gravida Para Term Preterm AB Living  3 2 2     2   SAB TAB Ectopic Multiple Live Births        0 2    # Outcome Date GA Lbr Len/2nd Weight Sex Delivery Anes PTL Lv  3 Current           2 Term 08/27/14 [redacted]w[redacted]d 01:06 / 00:14 7 lb 6.6 oz (3.362 kg) F Vag-Spont None  LIV  1 Term 04/11/11 [redacted]w[redacted]d 11:48 / 00:49 7 lb 7.9 oz (3.399 kg) M Vag-Spont EPI N LIV   Physical Assessment:   Vitals:   08/08/17 1239  BP: 124/76  Pulse: 74  Weight: 195 lb (88.5 kg)  Body mass index is 30.36 kg/m.       Physical Examination:  General appearance - well appearing, and in no  distress  Mental status - alert, oriented to person, place, and time  Psych:  She has a normal mood and affect  Skin - warm and dry, normal color, no suspicious lesions noted  Chest - effort normal, all lung fields clear to auscultation bilaterally  Heart - normal rate and regular rhythm  Abdomen - soft, nontender  Extremities:  No swelling or varicosities noted  Pelvic - VULVA: normal appearing vulva with no masses, tenderness or lesions  VAGINA: normal appearing vagina with normal color and nonodorous discharge, no lesions  CERVIX: normal appearing cervix without discharge or lesions, friable, no CMT  Thin prep pap is done w/ reflex HR HPV cotesting  Fetal Heart Rate (bpm): +u/s via informal transabdominal u/s  Results for orders placed or performed in visit on 08/08/17 (from the past 24 hour(s))  POCT Wet Prep [facility])   Collection Time: 08/08/17  1:28 PM  Result Value Ref Range   Source Wet Prep POC vaginal    WBC, Wet Prep HPF POC few    Bacteria Wet Prep HPF POC Few Few   BACTERIA WET PREP MORPHOLOGY POC     Clue Cells Wet Prep HPF POC None None   Clue Cells Wet Prep Whiff POC Negative Whiff    Yeast Wet Prep HPF POC None None   KOH Wet Prep POC  None   Trichomonas Wet Prep HPF POC  Absent Absent    Assessment & Plan:  1) Low-Risk Pregnancy G3P2002 at [redacted]w[redacted]d with an Estimated Date of Delivery: 03/09/18   2) Initial OB visit  3) H/O A1DM> refuses early GTT, will check A1C  4) Dep/anx> rx zoloft 25mg  daily, understands can take a few weeks to notice improvement, referral to [facility] ordered, let us know if doesn't hear from them w/in 1wk  5) N/V> rx diclegis, gave printed prevention/relief measures   6) THC use> advised cessation  Meds:  Meds ordered this encounter  Medications  . Doxylamine-Pyridoxine (DICLEGIS) 10-10 MG TBEC    Sig: 2 tabs q hs, if sx persist add 1 tab q am on day 3, if sx persist add 1 tab q afternoon on day 4    Dispense:  100 tablet    Refill:  6     Order Specific Question:   Supervising Provider    Answer:   Elonda Husky, LUTHER H [[id number]]  . sertraline (ZOLOFT) 25 MG tablet    Sig: Take 1 tablet (25 mg total) by mouth daily.    Dispense:  30 tablet    Refill:  6    Order Specific Question:   Supervising Provider    Answer:   Florian Buff [[id number]]    Initial labs obtained Continue prenatal vitamins Reviewed n/v relief measures and warning s/s to report Reviewed recommended weight gain based on pre-gravid BMI Encouraged well-balanced diet Genetic Screening discussed Integrated Screen: requested Cystic fibrosis screening discussed requested Ultrasound discussed; fetal survey: requested CCNC completed [employer] optimization  Follow-up: Return in about 4 weeks (around 09/02/2017) for LROB, US:NT+1stIT.   Orders Placed This Encounter  Procedures  . Urine Culture  . US Fetal Nuchal Translucency Measurement  . Pain Management Screening Profile (10S)  . Cystic Fibrosis Mutation 97  . Urinalysis, Routine w reflex microscopic  . Obstetric Panel, Including HIV  . Hemoglobin A1c  . Ambulatory referral to [facility]  . POCT urinalysis dipstick  . POCT Wet Prep [facility] [place])    Roma Schanz CNM, [place] 08/08/2017 1:32 PM

## 2017-08-08 NOTE — Patient Instructions (Signed)
Kimberly Olsen, I greatly value your feedback.  If you receive a survey following your visit with [place] today, we appreciate you taking the time to fill it out.  Thanks, [name], CNM, WHNP-BC   Nausea & Vomiting  Have saltine crackers or pretzels by your bed and eat a few bites before you raise your head out of bed in the morning  Eat small frequent meals throughout the day instead of large meals  Drink plenty of fluids throughout the day to stay hydrated, just don't drink a lot of fluids with your meals.  This can make your stomach fill up faster making you feel sick  Do not brush your teeth right after you eat  Products with real ginger are good for nausea, like ginger ale and ginger hard candy Make sure it says made with real ginger!  Sucking on sour candy like lemon heads is also good for nausea  If your prenatal vitamins make you nauseated, take them at night so you will sleep through the nausea  Sea Bands  If you feel like you need medicine for the nausea & vomiting please let us know  If you are unable to keep any fluids or food down please let us know   Constipation  Drink plenty of fluid, preferably water, throughout the day  Eat foods high in fiber such as fruits, vegetables, and grains  Exercise, such as walking, is a good way to keep your bowels regular  Drink warm fluids, especially warm prune juice, or decaf coffee  Eat a 1/2 cup of real oatmeal (not instant), 1/2 cup applesauce, and 1/2-1 cup warm prune juice every day  If needed, you may take Colace (docusate sodium) stool softener once or twice a day to help keep the stool soft. If you are pregnant, wait until you are out of your first trimester (12-14 weeks of pregnancy)  If you still are having problems with constipation, you may take Miralax once daily as needed to help keep your bowels regular.  If you are pregnant, wait until you are out of your first trimester (12-14 weeks of pregnancy)   First  Trimester of Pregnancy The first trimester of pregnancy is from week 1 until the end of week 12 (months 1 through 3). A week after a sperm fertilizes an egg, the egg will implant on the wall of the uterus. This embryo will begin to develop into a baby. Genes from you and your partner are forming the baby. The female genes determine whether the baby is a boy or a girl. At 6-8 weeks, the eyes and face are formed, and the heartbeat can be seen on ultrasound. At the end of 12 weeks, all the baby's organs are formed.  Now that you are pregnant, you will want to do everything you can to have a healthy baby. Two of the most important things are to get good prenatal care and to follow your health care provider's instructions. Prenatal care is all the medical care you receive before the baby's birth. This care will help prevent, find, and treat any problems during the pregnancy and childbirth. BODY CHANGES Your body goes through many changes during pregnancy. The changes vary from woman to woman.   You may gain or lose a couple of pounds at first.  You may feel sick to your stomach (nauseous) and throw up (vomit). If the vomiting is uncontrollable, call your health care provider.  You may tire easily.  You may develop headaches  that can be relieved by medicines approved by your health care provider.  You may urinate more often. Painful urination may mean you have a bladder infection.  You may develop heartburn as a result of your pregnancy.  You may develop constipation because certain hormones are causing the muscles that push waste through your intestines to slow down.  You may develop hemorrhoids or swollen, bulging veins (varicose veins).  Your breasts may begin to grow larger and become tender. Your nipples may stick out more, and the tissue that surrounds them (areola) may become darker.  Your gums may bleed and may be sensitive to brushing and flossing.  Dark spots or blotches (chloasma, mask  of pregnancy) may develop on your face. This will likely fade after the baby is born.  Your menstrual periods will stop.  You may have a loss of appetite.  You may develop cravings for certain kinds of food.  You may have changes in your emotions from day to day, such as being excited to be pregnant or being concerned that something may go wrong with the pregnancy and baby.  You may have more vivid and strange dreams.  You may have changes in your hair. These can include thickening of your hair, rapid growth, and changes in texture. Some women also have hair loss during or after pregnancy, or hair that feels dry or thin. Your hair will most likely return to normal after your baby is born. WHAT TO EXPECT AT YOUR PRENATAL VISITS During a routine prenatal visit:  You will be weighed to make sure you and the baby are growing normally.  Your blood pressure will be taken.  Your abdomen will be measured to track your baby's growth.  The fetal heartbeat will be listened to starting around week 10 or 12 of your pregnancy.  Test results from any previous visits will be discussed. Your health care provider may ask you:  How you are feeling.  If you are feeling the baby move.  If you have had any abnormal symptoms, such as leaking fluid, bleeding, severe headaches, or abdominal cramping.  If you have any questions. Other tests that may be performed during your first trimester include:  Blood tests to find your blood type and to check for the presence of any previous infections. They will also be used to check for low iron levels (anemia) and Rh antibodies. Later in the pregnancy, blood tests for diabetes will be done along with other tests if problems develop.  Urine tests to check for infections, diabetes, or protein in the urine.  An ultrasound to confirm the proper growth and development of the baby.  An amniocentesis to check for possible genetic problems.  Fetal screens for spina  bifida and Down syndrome.  You may need other tests to make sure you and the baby are doing well. HOME CARE INSTRUCTIONS  Medicines  Follow your health care provider's instructions regarding medicine use. Specific medicines may be either safe or unsafe to take during pregnancy.  Take your prenatal vitamins as directed.  If you develop constipation, try taking a stool softener if your health care provider approves. Diet  Eat regular, well-balanced meals. Choose a variety of foods, such as meat or vegetable-based protein, fish, milk and low-fat dairy products, vegetables, fruits, and whole grain breads and cereals. Your health care provider will help you determine the amount of weight gain that is right for you.  Avoid raw meat and uncooked cheese. These carry germs that can  cause birth defects in the baby.  Eating four or five small meals rather than three large meals a day may help relieve nausea and vomiting. If you start to feel nauseous, eating a few soda crackers can be helpful. Drinking liquids between meals instead of during meals also seems to help nausea and vomiting.  If you develop constipation, eat more high-fiber foods, such as fresh vegetables or fruit and whole grains. Drink enough fluids to keep your urine clear or pale yellow. Activity and Exercise  Exercise only as directed by your health care provider. Exercising will help you:  Control your weight.  Stay in shape.  Be prepared for labor and delivery.  Experiencing pain or cramping in the lower abdomen or low back is a good sign that you should stop exercising. Check with your health care provider before continuing normal exercises.  Try to avoid standing for long periods of time. Move your legs often if you must stand in one place for a long time.  Avoid heavy lifting.  Wear low-heeled shoes, and practice good posture.  You may continue to have sex unless your health care provider directs you  otherwise. Relief of Pain or Discomfort  Wear a good support bra for breast tenderness.   Take warm sitz baths to soothe any pain or discomfort caused by hemorrhoids. Use hemorrhoid cream if your health care provider approves.   Rest with your legs elevated if you have leg cramps or low back pain.  If you develop varicose veins in your legs, wear support hose. Elevate your feet for 15 minutes, 3-4 times a day. Limit salt in your diet. Prenatal Care  Schedule your prenatal visits by the twelfth week of pregnancy. They are usually scheduled monthly at first, then more often in the last 2 months before delivery.  Write down your questions. Take them to your prenatal visits.  Keep all your prenatal visits as directed by your health care provider. Safety  Wear your seat belt at all times when driving.  Make a list of emergency phone numbers, including numbers for family, friends, the hospital, and police and fire departments. General Tips  Ask your health care provider for a referral to a local prenatal education class. Begin classes no later than at the beginning of month 6 of your pregnancy.  Ask for help if you have counseling or nutritional needs during pregnancy. Your health care provider can offer advice or refer you to specialists for help with various needs.  Do not use hot tubs, steam rooms, or saunas.  Do not douche or use tampons or scented sanitary pads.  Do not cross your legs for long periods of time.  Avoid cat litter boxes and soil used by cats. These carry germs that can cause birth defects in the baby and possibly loss of the fetus by miscarriage or stillbirth.  Avoid all smoking, herbs, alcohol, and medicines not prescribed by your health care provider. Chemicals in these affect the formation and growth of the baby.  Schedule a dentist appointment. At home, brush your teeth with a soft toothbrush and be gentle when you floss. SEEK MEDICAL CARE IF:   You have  dizziness.  You have mild pelvic cramps, pelvic pressure, or nagging pain in the abdominal area.  You have persistent nausea, vomiting, or diarrhea.  You have a bad smelling vaginal discharge.  You have pain with urination.  You notice increased swelling in your face, hands, legs, or ankles. SEEK IMMEDIATE MEDICAL CARE IF:  You have a fever.  You are leaking fluid from your vagina.  You have spotting or bleeding from your vagina.  You have severe abdominal cramping or pain.  You have rapid weight gain or loss.  You vomit blood or material that looks like coffee grounds.  You are exposed to [place] measles and have never had them.  You are exposed to fifth disease or chickenpox.  You develop a severe headache.  You have shortness of breath.  You have any kind of trauma, such as from a fall or a car accident. Document Released: 02/26/2001 Document Revised: 07/19/2013 Document Reviewed: 01/12/2013 [facility] Patient Information 2015 [place], [place]. This information is not intended to replace advice given to you by your health care provider. Make sure you discuss any questions you have with your health care provider.

## 2017-08-09 LAB — OBSTETRIC PANEL, INCLUDING HIV
Antibody Screen: NEGATIVE
Basophils Absolute: 0 x10E3/uL (ref 0.0–0.2)
Basos: 0 %
EOS (ABSOLUTE): 0.2 x10E3/uL (ref 0.0–0.4)
Eos: 1 %
HIV Screen 4th Generation wRfx: NONREACTIVE
Hematocrit: 35.7 % (ref 34.0–46.6)
Hemoglobin: 12.2 g/dL (ref 11.1–15.9)
Hepatitis B Surface Ag: NEGATIVE
Immature Grans (Abs): 0 x10E3/uL (ref 0.0–0.1)
Immature Granulocytes: 0 %
Lymphocytes Absolute: 2.4 x10E3/uL (ref 0.7–3.1)
Lymphs: 22 %
MCH: 29.1 pg (ref 26.6–33.0)
MCHC: 34.2 g/dL (ref 31.5–35.7)
MCV: 85 fL (ref 79–97)
Monocytes Absolute: 0.3 x10E3/uL (ref 0.1–0.9)
Monocytes: 3 %
Neutrophils Absolute: 8.2 x10E3/uL — ABNORMAL HIGH (ref 1.4–7.0)
Neutrophils: 74 %
Platelets: 304 x10E3/uL (ref 150–450)
RBC: 4.19 x10E6/uL (ref 3.77–5.28)
RDW: 14.4 % (ref 12.3–15.4)
RPR Ser Ql: NONREACTIVE
Rh Factor: POSITIVE
Rubella Antibodies, IGG: 1.99 {index}
WBC: 11.1 x10E3/uL — ABNORMAL HIGH (ref 3.4–10.8)

## 2017-08-09 LAB — HEMOGLOBIN A1C
Est. average glucose Bld gHb Est-mCnc: 105 mg/dL
HEMOGLOBIN A1C: 5.3 % (ref 4.8–5.6)

## 2017-08-09 LAB — MED LIST OPTION NOT SELECTED

## 2017-08-10 LAB — URINE CULTURE

## 2017-08-11 LAB — PMP SCREEN PROFILE (10S), URINE
AMPHETAMINE SCREEN URINE: NEGATIVE ng/mL
BARBITURATE SCREEN URINE: NEGATIVE ng/mL
BENZODIAZEPINE SCREEN, URINE: NEGATIVE ng/mL
CANNABINOIDS UR QL SCN: POSITIVE ng/mL — AB
Cocaine (Metab) Scrn, Ur: NEGATIVE ng/mL
Creatinine(Crt), U: 156 mg/dL (ref 20.0–300.0)
Methadone Screen, Urine: NEGATIVE ng/mL
OXYCODONE+OXYMORPHONE UR QL SCN: NEGATIVE ng/mL
Opiate Scrn, Ur: NEGATIVE ng/mL
Ph of Urine: 6.1 (ref 4.5–8.9)
Phencyclidine Qn, Ur: NEGATIVE ng/mL
Propoxyphene Scrn, Ur: NEGATIVE ng/mL

## 2017-08-12 ENCOUNTER — Telehealth: Payer: Self-pay | Admitting: *Deleted

## 2017-08-12 NOTE — Telephone Encounter (Signed)
Pharmacy notified PA done and approved for Diclegis.

## 2017-08-13 LAB — CYTOLOGY - PAP
Chlamydia: NEGATIVE
Diagnosis: NEGATIVE
HPV: NOT DETECTED
Neisseria Gonorrhea: NEGATIVE

## 2017-08-15 ENCOUNTER — Telehealth: Payer: Self-pay | Admitting: Women's Health

## 2017-08-15 NOTE — Telephone Encounter (Signed)
LMOVM that she may need to call to check on referral.

## 2017-08-18 LAB — URINALYSIS, ROUTINE W REFLEX MICROSCOPIC
BILIRUBIN UA: NEGATIVE
Glucose, UA: NEGATIVE
Leukocytes, UA: NEGATIVE
Nitrite, UA: NEGATIVE
PROTEIN UA: NEGATIVE
RBC, UA: NEGATIVE
Specific Gravity, UA: 1.021 (ref 1.005–1.030)
UUROB: 0.2 mg/dL (ref 0.2–1.0)
pH, UA: 6 (ref 5.0–7.5)

## 2017-08-18 LAB — CYSTIC FIBROSIS MUTATION 97: Interpretation: NOT DETECTED

## 2017-08-19 ENCOUNTER — Other Ambulatory Visit: Payer: Self-pay | Admitting: *Deleted

## 2017-08-19 DIAGNOSIS — F418 Other specified anxiety disorders: Secondary | ICD-10-CM

## 2017-08-25 ENCOUNTER — Telehealth: Payer: Self-pay | Admitting: *Deleted

## 2017-08-25 NOTE — Telephone Encounter (Signed)
Spoke to patient regarding babyscripts activation.  States she has not activated it yet because she didn't know that was what it was.  Advised she will need to go ahead and activate so that she can get her mommy kit as it takes a week to receive.  States she will do it this afternoon after she gets off or work.

## 2017-09-02 ENCOUNTER — Telehealth: Payer: Self-pay | Admitting: *Deleted

## 2017-09-02 NOTE — Telephone Encounter (Signed)
LMOVM for patient to refer to new ob handout with recommendations for insomnia.  Advised to try Tylenol PM, Benadryl or Unisom.

## 2017-09-04 DIAGNOSIS — Z3490 Encounter for supervision of normal pregnancy, unspecified, unspecified trimester: Secondary | ICD-10-CM

## 2017-09-05 ENCOUNTER — Ambulatory Visit (INDEPENDENT_AMBULATORY_CARE_PROVIDER_SITE_OTHER): Payer: Medicaid Other

## 2017-09-05 ENCOUNTER — Encounter: Payer: Self-pay | Admitting: Women's Health

## 2017-09-05 ENCOUNTER — Ambulatory Visit (INDEPENDENT_AMBULATORY_CARE_PROVIDER_SITE_OTHER): Payer: Medicaid Other | Admitting: Women's Health

## 2017-09-05 VITALS — BP 116/61 | HR 70 | Wt 188.5 lb

## 2017-09-05 DIAGNOSIS — O09291 Supervision of pregnancy with other poor reproductive or obstetric history, first trimester: Secondary | ICD-10-CM

## 2017-09-05 DIAGNOSIS — Z331 Pregnant state, incidental: Secondary | ICD-10-CM

## 2017-09-05 DIAGNOSIS — Z3481 Encounter for supervision of other normal pregnancy, first trimester: Secondary | ICD-10-CM

## 2017-09-05 DIAGNOSIS — Z1389 Encounter for screening for other disorder: Secondary | ICD-10-CM

## 2017-09-05 DIAGNOSIS — Z363 Encounter for antenatal screening for malformations: Secondary | ICD-10-CM

## 2017-09-05 DIAGNOSIS — Z3682 Encounter for antenatal screening for nuchal translucency: Secondary | ICD-10-CM

## 2017-09-05 DIAGNOSIS — Z3A13 13 weeks gestation of pregnancy: Secondary | ICD-10-CM

## 2017-09-05 LAB — POCT URINALYSIS DIPSTICK
GLUCOSE UA: NEGATIVE
Ketones, UA: NEGATIVE
LEUKOCYTES UA: NEGATIVE
Nitrite, UA: NEGATIVE
Protein, UA: NEGATIVE

## 2017-09-05 NOTE — Patient Instructions (Signed)
Kimberly Olsen, I greatly value your feedback.  If you receive a survey following your visit with [place] today, we appreciate you taking the time to fill it out.  Thanks, [name], CNM, WHNP-BC   Second Trimester of Pregnancy The second trimester is from week 14 through week 27 (months 4 through 6). The second trimester is often a time when you feel your best. Your body has adjusted to being pregnant, and you begin to feel better physically. Usually, morning sickness has lessened or quit completely, you may have more energy, and you may have an increase in appetite. The second trimester is also a time when the fetus is growing rapidly. At the end of the sixth month, the fetus is about 9 inches long and weighs about 1 pounds. You will likely begin to feel the baby move (quickening) between 16 and 20 weeks of pregnancy. Body changes during your second trimester Your body continues to go through many changes during your second trimester. The changes vary from woman to woman.  Your weight will continue to increase. You will notice your lower abdomen bulging out.  You may begin to get stretch marks on your hips, abdomen, and breasts.  You may develop headaches that can be relieved by medicines. The medicines should be approved by your health care provider.  You may urinate more often because the fetus is pressing on your bladder.  You may develop or continue to have heartburn as a result of your pregnancy.  You may develop constipation because certain hormones are causing the muscles that push waste through your intestines to slow down.  You may develop hemorrhoids or swollen, bulging veins (varicose veins).  You may have back pain. This is caused by: ? Weight gain. ? Pregnancy hormones that are relaxing the joints in your pelvis. ? A shift in weight and the muscles that support your balance.  Your breasts will continue to grow and they will continue to become tender.  Your gums may bleed  and may be sensitive to brushing and flossing.  Dark spots or blotches (chloasma, mask of pregnancy) may develop on your face. This will likely fade after the baby is born.  A dark line from your belly button to the pubic area (linea nigra) may appear. This will likely fade after the baby is born.  You may have changes in your hair. These can include thickening of your hair, rapid growth, and changes in texture. Some women also have hair loss during or after pregnancy, or hair that feels dry or thin. Your hair will most likely return to normal after your baby is born.  What to expect at prenatal visits During a routine prenatal visit:  You will be weighed to make sure you and the fetus are growing normally.  Your blood pressure will be taken.  Your abdomen will be measured to track your baby's growth.  The fetal heartbeat will be listened to.  Any test results from the previous visit will be discussed.  Your health care provider may ask you:  How you are feeling.  If you are feeling the baby move.  If you have had any abnormal symptoms, such as leaking fluid, bleeding, severe headaches, or abdominal cramping.  If you are using any tobacco products, including cigarettes, chewing tobacco, and electronic cigarettes.  If you have any questions.  Other tests that may be performed during your second trimester include:  Blood tests that check for: ? Low iron levels (anemia). ? High  blood sugar that affects pregnant women (gestational diabetes) between 3 and 28 weeks. ? Rh antibodies. This is to check for a protein on red blood cells (Rh factor).  Urine tests to check for infections, diabetes, or protein in the urine.  An ultrasound to confirm the proper growth and development of the baby.  An amniocentesis to check for possible genetic problems.  Fetal screens for spina bifida and Down syndrome.  HIV (human immunodeficiency virus) testing. Routine prenatal testing includes  screening for HIV, unless you choose not to have this test.  Follow these instructions at home: Medicines  Follow your health care provider's instructions regarding medicine use. Specific medicines may be either safe or unsafe to take during pregnancy.  Take a prenatal vitamin that contains at least 600 micrograms (mcg) of folic acid.  If you develop constipation, try taking a stool softener if your health care provider approves. Eating and drinking  Eat a balanced diet that includes fresh fruits and vegetables, whole grains, good sources of protein such as meat, eggs, or tofu, and low-fat dairy. Your health care provider will help you determine the amount of weight gain that is right for you.  Avoid raw meat and uncooked cheese. These carry germs that can cause birth defects in the baby.  If you have low calcium intake from food, talk to your health care provider about whether you should take a daily calcium supplement.  Limit foods that are high in fat and processed sugars, such as fried and sweet foods.  To prevent constipation: ? Drink enough fluid to keep your urine clear or pale yellow. ? Eat foods that are high in fiber, such as fresh fruits and vegetables, whole grains, and beans. Activity  Exercise only as directed by your health care provider. Most women can continue their usual exercise routine during pregnancy. Try to exercise for 30 minutes at least 5 days a week. Stop exercising if you experience uterine contractions.  Avoid heavy lifting, wear low heel shoes, and practice good posture.  A sexual relationship may be continued unless your health care provider directs you otherwise. Relieving pain and discomfort  Wear a good support bra to prevent discomfort from breast tenderness.  Take warm sitz baths to soothe any pain or discomfort caused by hemorrhoids. Use hemorrhoid cream if your health care provider approves.  Rest with your legs elevated if you have leg cramps  or low back pain.  If you develop varicose veins, wear support hose. Elevate your feet for 15 minutes, 3-4 times a day. Limit salt in your diet. Prenatal Care  Write down your questions. Take them to your prenatal visits.  Keep all your prenatal visits as told by your health care provider. This is important. Safety  Wear your seat belt at all times when driving.  Make a list of emergency phone numbers, including numbers for family, friends, the hospital, and police and fire departments. General instructions  Ask your health care provider for a referral to a local prenatal education class. Begin classes no later than the beginning of month 6 of your pregnancy.  Ask for help if you have counseling or nutritional needs during pregnancy. Your health care provider can offer advice or refer you to specialists for help with various needs.  Do not use hot tubs, steam rooms, or saunas.  Do not douche or use tampons or scented sanitary pads.  Do not cross your legs for long periods of time.  Avoid cat litter boxes and soil  used by cats. These carry germs that can cause birth defects in the baby and possibly loss of the fetus by miscarriage or stillbirth.  Avoid all smoking, herbs, alcohol, and unprescribed drugs. Chemicals in these products can affect the formation and growth of the baby.  Do not use any products that contain nicotine or tobacco, such as cigarettes and e-cigarettes. If you need help quitting, ask your health care provider.  Visit your dentist if you have not gone yet during your pregnancy. Use a soft toothbrush to brush your teeth and be gentle when you floss. Contact a health care provider if:  You have dizziness.  You have mild pelvic cramps, pelvic pressure, or nagging pain in the abdominal area.  You have persistent nausea, vomiting, or diarrhea.  You have a bad smelling vaginal discharge.  You have pain when you urinate. Get help right away if:  You have a  fever.  You are leaking fluid from your vagina.  You have spotting or bleeding from your vagina.  You have severe abdominal cramping or pain.  You have rapid weight gain or weight loss.  You have shortness of breath with chest pain.  You notice sudden or extreme swelling of your face, hands, ankles, feet, or legs.  You have not felt your baby move in over an hour.  You have severe headaches that do not go away when you take medicine.  You have vision changes. Summary  The second trimester is from week 14 through week 27 (months 4 through 6). It is also a time when the fetus is growing rapidly.  Your body goes through many changes during pregnancy. The changes vary from woman to woman.  Avoid all smoking, herbs, alcohol, and unprescribed drugs. These chemicals affect the formation and growth your baby.  Do not use any tobacco products, such as cigarettes, chewing tobacco, and e-cigarettes. If you need help quitting, ask your health care provider.  Contact your health care provider if you have any questions. Keep all prenatal visits as told by your health care provider. This is important. This information is not intended to replace advice given to you by your health care provider. Make sure you discuss any questions you have with your health care provider. Document Released: 02/26/2001 Document Revised: 08/10/2015 Document Reviewed: 05/05/2012 Elsevier Interactive Patient Education  2017 [employer].

## 2017-09-05 NOTE — Progress Notes (Signed)
[place] 13+4 wks,measurements c/w dates,normal right ovary,left oophorectomy,crl 80.27 mm,fhr 167 b pm,posterior pl gr 0,NB present,NT 1.7 mm

## 2017-09-05 NOTE — Progress Notes (Signed)
   LOW-RISK PREGNANCY VISIT Patient name: Kimberly Olsen MRN [id number]  Date of birth: 1990-06-24 Chief Complaint:   Routine Prenatal Visit (1st IT)  History of Present Illness:   Kimberly Olsen is a 27 y.o. G23P2002 female at [redacted]w[redacted]d with an Estimated Date of Delivery: 03/09/18 being seen today for ongoing management of a low-risk pregnancy.  Today she reports no complaints.  . Vag. Bleeding: Scant.  Movement: Absent. denies leaking of fluid. Review of Systems:   Pertinent items are noted in HPI Denies abnormal vaginal discharge w/ itching/odor/irritation, headaches, visual changes, shortness of breath, chest pain, abdominal pain, severe nausea/vomiting, or problems with urination or bowel movements unless otherwise stated above. Pertinent History Reviewed:  Reviewed past medical,surgical, social, obstetrical and family history.  Reviewed problem list, medications and allergies. Physical Assessment:   Vitals:   09/05/17 1236  BP: 116/61  Pulse: 70  Weight: 188 lb 8 oz (85.5 kg)  Body mass index is 29.35 kg/m.        Physical Examination:   General appearance: Well appearing, and in no distress  Mental status: Alert, oriented to person, place, and time  Skin: Warm & dry  Cardiovascular: Normal heart rate noted  Respiratory: Normal respiratory effort, no distress  Abdomen: Soft, gravid, nontender  Pelvic: Cervical exam deferred         Extremities: Edema: None  Fetal Status: Fetal Heart Rate (bpm): +u/s   Movement: Absent   [place] 13+4 wks,measurements c/w dates,normal right ovary,left oophorectomy,crl 80.27 mm,fhr 167 b pm,posterior pl gr 0,NB present,NT 1.7 mm   Results for orders placed or performed in visit on 09/05/17 (from the past 24 hour(s))  POCT urinalysis dipstick   Collection Time: 09/05/17 12:37 PM  Result Value Ref Range   Color, UA     Clarity, UA     Glucose, UA Negative Negative   Bilirubin, UA     Ketones, UA neg    Spec Grav, UA  1.010 - 1.025   Blood, UA  trace    pH, UA  5.0 - 8.0   Protein, UA Negative Negative   Urobilinogen, UA  0.2 or 1.0 E.U./dL   Nitrite, UA neg    Leukocytes, UA Negative Negative   Appearance     Odor      Assessment & Plan:  1) Low-risk pregnancy G3P2002 at [redacted]w[redacted]d with an Estimated Date of Delivery: 03/09/18   2) H/O A1DM> A1C normal, declined GTT  3) BabyScripts pt> just got bp cuff this week   Meds: No orders of the defined types were placed in this encounter.  Labs/procedures today: 1st IT/NT  Plan:  Continue routine obstetrical care   Reviewed: Preterm labor symptoms and general obstetric precautions including but not limited to vaginal bleeding, contractions, leaking of fluid and fetal movement were reviewed in detail with the patient.  All questions were answered  Follow-up: Return in about 1 month (around 10/06/2017) for LROB, LY:YTKPTWS, 2nd IT.  Orders Placed This Encounter  Procedures  . US OB Comp + 14 Wk  . Integrated 1  . POCT urinalysis dipstick   Roma Schanz CNM, [facility] 09/05/2017 1:09 PM

## 2017-09-09 LAB — INTEGRATED 1
Crown Rump Length: 80.3 mm
Gest. Age on Collection Date: 13.7 weeks
MATERNAL AGE AT EDD: 29.9 a
NUMBER OF FETUSES: 1
Nuchal Translucency (NT): 1.7 mm
PAPP-A Value: 1332.8 ng/mL
WEIGHT: 189 [lb_av]

## 2017-09-22 ENCOUNTER — Telehealth: Payer: Self-pay | Admitting: Obstetrics & Gynecology

## 2017-09-22 NOTE — Telephone Encounter (Signed)
Patient says she is having trouble getting into her babyscripts app. It says she is still 14 weeks and she can't go any further.  Advised patient to call customer service for help as her acct may need to be reset.  Number given and pt verbalized understanding.

## 2017-10-06 ENCOUNTER — Ambulatory Visit (HOSPITAL_COMMUNITY): Payer: Medicaid Other | Admitting: Psychiatry

## 2017-10-06 NOTE — Progress Notes (Deleted)
Psychiatric Initial Adult Assessment   Patient Identification: Kimberly Olsen MRN:  [id number] Date of Evaluation:  10/06/2017 Referral Source: *** Chief Complaint:   Visit Diagnosis: No diagnosis found.  History of Present Illness:   Kimberly Olsen is a 27 y.o. year old female, pregnant,  with a history of depression, anxiety, who is referred for     Associated Signs/Symptoms: Depression Symptoms:  {DEPRESSION SYMPTOMS:20000} (Hypo) Manic Symptoms:  {BHH MANIC SYMPTOMS:22872} Anxiety Symptoms:  {BHH ANXIETY SYMPTOMS:22873} Psychotic Symptoms:  {BHH PSYCHOTIC SYMPTOMS:22874} PTSD Symptoms: {BHH PTSD SYMPTOMS:22875}  Past Psychiatric History:  Outpatient:  Psychiatry admission:  Previous suicide attempt:  Past trials of medication:  History of violence:   Previous Psychotropic Medications: {YES/NO:21197}  Substance Abuse History in the last 12 months:  {yes no:314532}  Consequences of Substance Abuse: {BHH CONSEQUENCES OF SUBSTANCE ABUSE:22880}  Past Medical History:  Past Medical History:  Diagnosis Date  . Anemia   . Anxiety   . Depression     Past Surgical History:  Procedure Laterality Date  . OOPHORECTOMY      Family Psychiatric History: ***  Family History:  Family History  Problem Relation Age of Onset  . Depression Mother   . Asthma Mother   . Mental illness Mother   . Other Mother        drug over dose  . Depression Father   . Mental illness Father   . Anesthesia problems Neg Hx   . Hypotension Neg Hx   . Malignant hyperthermia Neg Hx   . Pseudochol deficiency Neg Hx     Social History:   Social History   Socioeconomic History  . Marital status: Married    Spouse name: Not on file  . Number of children: Not on file  . Years of education: Not on file  . Highest education level: Not on file  Occupational History  . Not on file  Social Needs  . Financial resource strain: Not on file  . Food insecurity:    Worry: Not on file     Inability: Not on file  . Transportation needs:    Medical: Not on file    Non-medical: Not on file  Tobacco Use  . Smoking status: Former Research scientist (life sciences)  . Smokeless tobacco: Never Used  Substance and Sexual Activity  . Alcohol use: No  . Drug use: Yes    Types: Marijuana    Comment: last used 08/05/2017  . Sexual activity: Yes    Birth control/protection: None  Lifestyle  . Physical activity:    Days per week: Not on file    Minutes per session: Not on file  . Stress: Not on file  Relationships  . Social connections:    Talks on phone: Not on file    Gets together: Not on file    Attends religious service: Not on file    Active member of club or organization: Not on file    Attends meetings of clubs or organizations: Not on file    Relationship status: Not on file  Other Topics Concern  . Not on file  Social History Narrative  . Not on file    Additional Social History: ***  Allergies:   Allergies  Allergen Reactions  . Coconut Fatty Acids Hives  . Sulfa Antibiotics Hives and Rash    Metabolic Disorder Labs: Lab Results  Component Value Date   HGBA1C 5.3 08/08/2017   No results found for: PROLACTIN No results found for: CHOL, TRIG, HDL,  CHOLHDL, VLDL, LDLCALC   Current Medications: Current Outpatient Medications  Medication Sig Dispense Refill  . Doxylamine-Pyridoxine (DICLEGIS) 10-10 MG TBEC 2 tabs q hs, if sx persist add 1 tab q am on day 3, if sx persist add 1 tab q afternoon on day 4 100 tablet 6  . omeprazole (PRILOSEC) 20 MG capsule Take 1 capsule (20 mg total) by mouth daily. 30 capsule 4  . prenatal vitamin w/FE, FA (PRENATAL 1 + 1) 27-1 MG TABS tablet Take 1 tablet by mouth daily at 12 noon. 30 each 12  . sertraline (ZOLOFT) 25 MG tablet Take 1 tablet (25 mg total) by mouth daily. (Patient taking differently: Take 50 mg by mouth at bedtime. ) 30 tablet 6   No current facility-administered medications for this visit.     Neurologic: Headache:  No Seizure: No Paresthesias:No  Musculoskeletal: Strength & Muscle Tone: within normal limits Gait & Station: normal Patient leans: N/A  Psychiatric Specialty Exam: ROS  Last menstrual period 06/02/2017, unknown if currently breastfeeding.There is no height or weight on file to calculate BMI.  General Appearance: Fairly Groomed  Eye Contact:  Good  Speech:  Clear and Coherent  Volume:  Normal  Mood:  {BHH MOOD:22306}  Affect:  {Affect (PAA):22687}  Thought Process:  Coherent  Orientation:  Full (Time, Place, and Person)  Thought Content:  Logical  Suicidal Thoughts:  {ST/HT (PAA):22692}  Homicidal Thoughts:  {ST/HT (PAA):22692}  Memory:  Immediate;   Good  Judgement:  {Judgement (PAA):22694}  Insight:  {Insight (PAA):22695}  Psychomotor Activity:  Normal  Concentration:  Concentration: Good and Attention Span: Good  Recall:  Good  Fund of Knowledge:Good  Language: Good  Akathisia:  No  Handed:  Right  AIMS (if indicated):  N/A  Assets:  Communication Skills Desire for Improvement  ADL's:  Intact  Cognition: WNL  Sleep:  ***   Assessment  Plan  The patient demonstrates the following risk factors for suicide: Chronic risk factors for suicide include: {Chronic Risk Factors for JOITGPQ:98264158}. Acute risk factors for suicide include: {Acute Risk Factors for XENMMHW:80881103}. Protective factors for this patient include: {Protective Factors for Suicide PRXY:58592924}. Considering these factors, the overall suicide risk at this point appears to be {Desc; low/moderate/high:110033}. Patient {ACTION; IS/IS MQK:86381771} appropriate for outpatient follow up.   Treatment Plan Summary: Plan as above   Norman Clay, MD 7/22/20194:30 PM

## 2017-10-13 ENCOUNTER — Ambulatory Visit (HOSPITAL_COMMUNITY): Payer: Medicaid Other | Admitting: Psychiatry

## 2017-10-17 ENCOUNTER — Ambulatory Visit (INDEPENDENT_AMBULATORY_CARE_PROVIDER_SITE_OTHER): Payer: Medicaid Other

## 2017-10-17 ENCOUNTER — Encounter: Payer: Self-pay | Admitting: Women's Health

## 2017-10-17 ENCOUNTER — Ambulatory Visit (INDEPENDENT_AMBULATORY_CARE_PROVIDER_SITE_OTHER): Payer: Medicaid Other | Admitting: Women's Health

## 2017-10-17 VITALS — BP 119/72 | HR 82 | Wt 194.0 lb

## 2017-10-17 DIAGNOSIS — Z363 Encounter for antenatal screening for malformations: Secondary | ICD-10-CM | POA: Diagnosis not present

## 2017-10-17 DIAGNOSIS — Z1389 Encounter for screening for other disorder: Secondary | ICD-10-CM

## 2017-10-17 DIAGNOSIS — Z3482 Encounter for supervision of other normal pregnancy, second trimester: Secondary | ICD-10-CM

## 2017-10-17 DIAGNOSIS — Z1379 Encounter for other screening for genetic and chromosomal anomalies: Secondary | ICD-10-CM

## 2017-10-17 DIAGNOSIS — Z331 Pregnant state, incidental: Secondary | ICD-10-CM

## 2017-10-17 DIAGNOSIS — Z3481 Encounter for supervision of other normal pregnancy, first trimester: Secondary | ICD-10-CM

## 2017-10-17 DIAGNOSIS — Z3A19 19 weeks gestation of pregnancy: Secondary | ICD-10-CM

## 2017-10-17 LAB — POCT URINALYSIS DIPSTICK OB
Glucose, UA: NEGATIVE — AB
Ketones, UA: NEGATIVE
LEUKOCYTES UA: NEGATIVE
Nitrite, UA: NEGATIVE
POC,PROTEIN,UA: NEGATIVE
RBC UA: NEGATIVE

## 2017-10-17 NOTE — Progress Notes (Signed)
   LOW-RISK PREGNANCY VISIT Patient name: Kimberly Olsen MRN [id number]  Date of birth: [birth year] Chief Complaint:   Routine Prenatal Visit (anatomy)  History of Present Illness:   Kimberly Olsen is a 27 y.o. G86P2002 female at [redacted]w[redacted]d with an Estimated Date of Delivery: 03/09/18 being seen today for ongoing management of a low-risk pregnancy.  Today she reports no complaints. Contractions: Not present. Vag. Bleeding: None.  Movement: Present. denies leaking of fluid. Review of Systems:   Pertinent items are noted in HPI Denies abnormal vaginal discharge w/ itching/odor/irritation, headaches, visual changes, shortness of breath, chest pain, abdominal pain, severe nausea/vomiting, or problems with urination or bowel movements unless otherwise stated above. Pertinent History Reviewed:  Reviewed past medical,surgical, social, obstetrical and family history.  Reviewed problem list, medications and allergies. Physical Assessment:   Vitals:   10/17/17 1221  BP: 119/72  Pulse: 82  Weight: 194 lb (88 kg)  Body mass index is 30.2 kg/m.        Physical Examination:   General appearance: Well appearing, and in no distress  Mental status: Alert, oriented to person, place, and time  Skin: Warm & dry  Cardiovascular: Normal heart rate noted  Respiratory: Normal respiratory effort, no distress  Abdomen: Soft, gravid, nontender  Pelvic: Cervical exam deferred         Extremities: Edema: None  Fetal Status: Fetal Heart Rate (bpm): 164 u/s   Movement: Present    [place] 27+7 wks,cephalic,cx 3.7 cm,posterior pl gr 0,svp of fluid 5.8 cm,normal right ovary,left oophorectomy,left adnexa WNL,fhr 164 bpm,efw 355 g 88%,anatomy complete,no obvious abnormalities    Results for orders placed or performed in visit on 10/17/17 (from the past 24 hour(s))  POC Urinalysis Dipstick OB   Collection Time: 10/17/17 12:25 PM  Result Value Ref Range   Color, UA     Clarity, UA     Glucose, UA Negative (A) (none)   Bilirubin, UA     Ketones, UA neg    Spec Grav, UA  1.010 - 1.025   Blood, UA neg    pH, UA  5.0 - 8.0   POC Protein UA Negative Negative, Trace   Urobilinogen, UA  0.2 or 1.0 E.U./dL   Nitrite, UA neg    Leukocytes, UA Negative Negative   Appearance     Odor      Assessment & Plan:  1) Low-risk pregnancy G3P2002 at [redacted]w[redacted]d with an Estimated Date of Delivery: 03/09/18   2) BabyScripts Optimized Pt> is checking BP's weekly as directed. Next appt at 26wks    Meds: No orders of the defined types were placed in this encounter.  Labs/procedures today: 2nd IT  Plan:  Continue routine obstetrical care   Reviewed: Preterm labor symptoms and general obstetric precautions including but not limited to vaginal bleeding, contractions, leaking of fluid and fetal movement were reviewed in detail with the patient.  All questions were answered  Follow-up: Return in about 6 weeks (around 12/01/2017) for LROB, PN2.  Orders Placed This Encounter  Procedures  . INTEGRATED 2  . POC Urinalysis Dipstick OB   Roma Schanz CNM, [facility] 10/17/2017 12:44 PM

## 2017-10-17 NOTE — Patient Instructions (Signed)
Kimberly Olsen, I greatly value your feedback.  If you receive a survey following your visit with [place] today, we appreciate you taking the time to fill it out.  Thanks, [name], CNM, WHNP-BC  You will have your sugar test next visit.  Please do not eat or drink anything after midnight the night before you come, not even water.  You will be here for at least two hours.      Second Trimester of Pregnancy The second trimester is from week 14 through week 27 (months 4 through 6). The second trimester is often a time when you feel your best. Your body has adjusted to being pregnant, and you begin to feel better physically. Usually, morning sickness has lessened or quit completely, you may have more energy, and you may have an increase in appetite. The second trimester is also a time when the fetus is growing rapidly. At the end of the sixth month, the fetus is about 9 inches long and weighs about 1 pounds. You will likely begin to feel the baby move (quickening) between 16 and 20 weeks of pregnancy. Body changes during your second trimester Your body continues to go through many changes during your second trimester. The changes vary from woman to woman.  Your weight will continue to increase. You will notice your lower abdomen bulging out.  You may begin to get stretch marks on your hips, abdomen, and breasts.  You may develop headaches that can be relieved by medicines. The medicines should be approved by your health care provider.  You may urinate more often because the fetus is pressing on your bladder.  You may develop or continue to have heartburn as a result of your pregnancy.  You may develop constipation because certain hormones are causing the muscles that push waste through your intestines to slow down.  You may develop hemorrhoids or swollen, bulging veins (varicose veins).  You may have back pain. This is caused by: ? Weight gain. ? Pregnancy hormones that are relaxing the joints  in your pelvis. ? A shift in weight and the muscles that support your balance.  Your breasts will continue to grow and they will continue to become tender.  Your gums may bleed and may be sensitive to brushing and flossing.  Dark spots or blotches (chloasma, mask of pregnancy) may develop on your face. This will likely fade after the baby is born.  A dark line from your belly button to the pubic area (linea nigra) may appear. This will likely fade after the baby is born.  You may have changes in your hair. These can include thickening of your hair, rapid growth, and changes in texture. Some women also have hair loss during or after pregnancy, or hair that feels dry or thin. Your hair will most likely return to normal after your baby is born.  What to expect at prenatal visits During a routine prenatal visit:  You will be weighed to make sure you and the fetus are growing normally.  Your blood pressure will be taken.  Your abdomen will be measured to track your baby's growth.  The fetal heartbeat will be listened to.  Any test results from the previous visit will be discussed.  Your health care provider may ask you:  How you are feeling.  If you are feeling the baby move.  If you have had any abnormal symptoms, such as leaking fluid, bleeding, severe headaches, or abdominal cramping.  If you are using any  tobacco products, including cigarettes, chewing tobacco, and electronic cigarettes.  If you have any questions.  Other tests that may be performed during your second trimester include:  Blood tests that check for: ? Low iron levels (anemia). ? High blood sugar that affects pregnant women (gestational diabetes) between 58 and 28 weeks. ? Rh antibodies. This is to check for a protein on red blood cells (Rh factor).  Urine tests to check for infections, diabetes, or protein in the urine.  An ultrasound to confirm the proper growth and development of the baby.  An  amniocentesis to check for possible genetic problems.  Fetal screens for spina bifida and Down syndrome.  HIV (human immunodeficiency virus) testing. Routine prenatal testing includes screening for HIV, unless you choose not to have this test.  Follow these instructions at home: Medicines  Follow your health care provider's instructions regarding medicine use. Specific medicines may be either safe or unsafe to take during pregnancy.  Take a prenatal vitamin that contains at least 600 micrograms (mcg) of folic acid.  If you develop constipation, try taking a stool softener if your health care provider approves. Eating and drinking  Eat a balanced diet that includes fresh fruits and vegetables, whole grains, good sources of protein such as meat, eggs, or tofu, and low-fat dairy. Your health care provider will help you determine the amount of weight gain that is right for you.  Avoid raw meat and uncooked cheese. These carry germs that can cause birth defects in the baby.  If you have low calcium intake from food, talk to your health care provider about whether you should take a daily calcium supplement.  Limit foods that are high in fat and processed sugars, such as fried and sweet foods.  To prevent constipation: ? Drink enough fluid to keep your urine clear or pale yellow. ? Eat foods that are high in fiber, such as fresh fruits and vegetables, whole grains, and beans. Activity  Exercise only as directed by your health care provider. Most women can continue their usual exercise routine during pregnancy. Try to exercise for 30 minutes at least 5 days a week. Stop exercising if you experience uterine contractions.  Avoid heavy lifting, wear low heel shoes, and practice good posture.  A sexual relationship may be continued unless your health care provider directs you otherwise. Relieving pain and discomfort  Wear a good support bra to prevent discomfort from breast  tenderness.  Take warm sitz baths to soothe any pain or discomfort caused by hemorrhoids. Use hemorrhoid cream if your health care provider approves.  Rest with your legs elevated if you have leg cramps or low back pain.  If you develop varicose veins, wear support hose. Elevate your feet for 15 minutes, 3-4 times a day. Limit salt in your diet. Prenatal Care  Write down your questions. Take them to your prenatal visits.  Keep all your prenatal visits as told by your health care provider. This is important. Safety  Wear your seat belt at all times when driving.  Make a list of emergency phone numbers, including numbers for family, friends, the hospital, and police and fire departments. General instructions  Ask your health care provider for a referral to a local prenatal education class. Begin classes no later than the beginning of month 6 of your pregnancy.  Ask for help if you have counseling or nutritional needs during pregnancy. Your health care provider can offer advice or refer you to specialists for help with various  needs.  Do not use hot tubs, steam rooms, or saunas.  Do not douche or use tampons or scented sanitary pads.  Do not cross your legs for long periods of time.  Avoid cat litter boxes and soil used by cats. These carry germs that can cause birth defects in the baby and possibly loss of the fetus by miscarriage or stillbirth.  Avoid all smoking, herbs, alcohol, and unprescribed drugs. Chemicals in these products can affect the formation and growth of the baby.  Do not use any products that contain nicotine or tobacco, such as cigarettes and e-cigarettes. If you need help quitting, ask your health care provider.  Visit your dentist if you have not gone yet during your pregnancy. Use a soft toothbrush to brush your teeth and be gentle when you floss. Contact a health care provider if:  You have dizziness.  You have mild pelvic cramps, pelvic pressure, or  nagging pain in the abdominal area.  You have persistent nausea, vomiting, or diarrhea.  You have a bad smelling vaginal discharge.  You have pain when you urinate. Get help right away if:  You have a fever.  You are leaking fluid from your vagina.  You have spotting or bleeding from your vagina.  You have severe abdominal cramping or pain.  You have rapid weight gain or weight loss.  You have shortness of breath with chest pain.  You notice sudden or extreme swelling of your face, hands, ankles, feet, or legs.  You have not felt your baby move in over an hour.  You have severe headaches that do not go away when you take medicine.  You have vision changes. Summary  The second trimester is from week 14 through week 27 (months 4 through 6). It is also a time when the fetus is growing rapidly.  Your body goes through many changes during pregnancy. The changes vary from woman to woman.  Avoid all smoking, herbs, alcohol, and unprescribed drugs. These chemicals affect the formation and growth your baby.  Do not use any tobacco products, such as cigarettes, chewing tobacco, and e-cigarettes. If you need help quitting, ask your health care provider.  Contact your health care provider if you have any questions. Keep all prenatal visits as told by your health care provider. This is important. This information is not intended to replace advice given to you by your health care provider. Make sure you discuss any questions you have with your health care provider. Document Released: 02/26/2001 Document Revised: 08/10/2015 Document Reviewed: 05/05/2012 Elsevier Interactive Patient Education  2017 [employer].

## 2017-10-17 NOTE — Progress Notes (Addendum)
[place] 27+1 wks,cephalic,cx 3.7 cm,posterior pl gr 0,svp of fluid 5.8 cm,normal right ovary,left oophorectomy,left adnexa WNL,fhr 164 bpm,efw 355 g 88%,anatomy complete,no obvious abnormalities

## 2017-10-21 LAB — INTEGRATED 2
AFP MARKER: 47.4 ng/mL
AFP MoM: 1.12
CROWN RUMP LENGTH: 80.3 mm
DIA MOM: 0.58
DIA Value: 99.2 pg/mL
ESTRIOL UNCONJUGATED: 1.38 ng/mL
GESTATIONAL AGE: 19.7 wk
Gest. Age on Collection Date: 13.7 weeks
Maternal Age at EDD: 29.9 yr
NUCHAL TRANSLUCENCY MOM: 0.9
Nuchal Translucency (NT): 1.7 mm
Number of Fetuses: 1
PAPP-A MOM: 1.3
PAPP-A VALUE: 1332.8 ng/mL
TEST RESULTS: NEGATIVE
WEIGHT: 189 [lb_av]
Weight: 189 [lb_av]
hCG MoM: 0.33
hCG Value: 6.4 IU/mL
uE3 MoM: 0.8

## 2017-11-18 ENCOUNTER — Telehealth: Payer: Self-pay | Admitting: *Deleted

## 2017-11-18 MED ORDER — PANTOPRAZOLE SODIUM 20 MG PO TBEC: 20.0000 mg | DELAYED_RELEASE_TABLET | Freq: Every day | ORAL | 3 refills | Status: DC

## 2017-11-18 NOTE — Telephone Encounter (Signed)
LMOVM that protonix was sent to pharmacy.

## 2017-11-28 ENCOUNTER — Encounter: Payer: Self-pay | Admitting: Women's Health

## 2017-11-28 ENCOUNTER — Ambulatory Visit (INDEPENDENT_AMBULATORY_CARE_PROVIDER_SITE_OTHER): Payer: Medicaid Other | Admitting: Women's Health

## 2017-11-28 ENCOUNTER — Other Ambulatory Visit: Payer: Medicaid Other

## 2017-11-28 ENCOUNTER — Other Ambulatory Visit: Payer: Self-pay

## 2017-11-28 VITALS — BP 103/60 | HR 73 | Wt 203.0 lb

## 2017-11-28 DIAGNOSIS — Z3A25 25 weeks gestation of pregnancy: Secondary | ICD-10-CM

## 2017-11-28 DIAGNOSIS — Z1389 Encounter for screening for other disorder: Secondary | ICD-10-CM

## 2017-11-28 DIAGNOSIS — Z331 Pregnant state, incidental: Secondary | ICD-10-CM

## 2017-11-28 DIAGNOSIS — Z3482 Encounter for supervision of other normal pregnancy, second trimester: Secondary | ICD-10-CM

## 2017-11-28 NOTE — Progress Notes (Signed)
   LOW-RISK PREGNANCY VISIT Patient name: Kimberly Olsen MRN [id number]  Date of birth: 09/21/1990 Chief Complaint:   Routine Prenatal Visit  History of Present Illness:   Kimberly Olsen is a 27 y.o. G55P2002 female at [redacted]w[redacted]d with an Estimated Date of Delivery: 03/09/18 being seen today for ongoing management of a low-risk pregnancy.  Today she reports no complaints. Contractions: Not present. Vag. Bleeding: None.  Movement: Present. denies leaking of fluid. Review of Systems:   Pertinent items are noted in HPI Denies abnormal vaginal discharge w/ itching/odor/irritation, headaches, visual changes, shortness of breath, chest pain, abdominal pain, severe nausea/vomiting, or problems with urination or bowel movements unless otherwise stated above. Pertinent History Reviewed:  Reviewed past medical,surgical, social, obstetrical and family history.  Reviewed problem list, medications and allergies. Physical Assessment:   Vitals:   11/28/17 1154  BP: 103/60  Pulse: 73  Weight: 203 lb (92.1 kg)  Body mass index is 31.61 kg/m.        Physical Examination:   General appearance: Well appearing, and in no distress  Mental status: Alert, oriented to person, place, and time  Skin: Warm & dry  Cardiovascular: Normal heart rate noted  Respiratory: Normal respiratory effort, no distress  Abdomen: Soft, gravid, nontender  Pelvic: Cervical exam deferred         Extremities: Edema: None  Fetal Status: Fetal Heart Rate (bpm): 140 Fundal Height: 25 cm Movement: Present    No results found for this or any previous visit (from the past 24 hour(s)).  Assessment & Plan:  1) Low-risk pregnancy G3P2002 at [redacted]w[redacted]d with an Estimated Date of Delivery: 03/09/18   2) BabyScripts Optimized Pt> is checking BP's weekly as directed. Next appt at 30wks    Meds: No orders of the defined types were placed in this encounter.  Labs/procedures today: pn2, declines flu shot today-wants next visit  Plan:  Continue  routine obstetrical care   Reviewed: Preterm labor symptoms and general obstetric precautions including but not limited to vaginal bleeding, contractions, leaking of fluid and fetal movement were reviewed in detail with the patient.  All questions were answered  Follow-up: Return in about 4 weeks (around 12/26/2017) for [name].  Orders Placed This Encounter  Procedures  . POC Urinalysis Dipstick OB   Roma Schanz CNM, [facility] 11/28/2017 12:09 PM

## 2017-11-28 NOTE — Patient Instructions (Signed)
Kimberly Olsen, I greatly value your feedback.  If you receive a survey following your visit with [place] today, we appreciate you taking the time to fill it out.  Thanks, [name], CNM, WHNP-BC   Call the office [phone]) or go to [facility] if:  You begin to have strong, frequent contractions  Your water breaks.  Sometimes it is a big gush of fluid, sometimes it is just a trickle that keeps getting your panties wet or running down your legs  You have vaginal bleeding.  It is normal to have a small amount of spotting if your cervix was checked.   You don't feel your baby moving like normal.  If you don't, get you something to eat and drink and lay down and focus on feeling your baby move.  You should feel at least 10 movements in 2 hours.  If you don't, you should call the office or go to [facility].    Tdap Vaccine  It is recommended that you get the Tdap vaccine during the third trimester of EACH pregnancy to help protect your baby from getting pertussis (whooping cough)  27-36 weeks is the BEST time to do this so that you can pass the protection on to your baby. During pregnancy is better than after pregnancy, but if you are unable to get it during pregnancy it will be offered at the hospital.   You can get this vaccine with [place], at the health department, your family doctor, or some local pharmacies  Everyone who will be around your baby should also be up-to-date on their vaccines before the baby comes. Adults (who are not pregnant) only need 1 dose of Tdap during adulthood.   Third Trimester of Pregnancy The third trimester is from week 29 through week 42, months 7 through 9. The third trimester is a time when the fetus is growing rapidly. At the end of the ninth month, the fetus is about 20 inches in length and weighs 6-10 pounds.  BODY CHANGES Your body goes through many changes during pregnancy. The changes vary from woman to woman.   Your weight will continue to  increase. You can expect to gain 25-35 pounds (11-16 kg) by the end of the pregnancy.  You may begin to get stretch marks on your hips, abdomen, and breasts.  You may urinate more often because the fetus is moving lower into your pelvis and pressing on your bladder.  You may develop or continue to have heartburn as a result of your pregnancy.  You may develop constipation because certain hormones are causing the muscles that push waste through your intestines to slow down.  You may develop hemorrhoids or swollen, bulging veins (varicose veins).  You may have pelvic pain because of the weight gain and pregnancy hormones relaxing your joints between the bones in your pelvis. Backaches may result from overexertion of the muscles supporting your posture.  You may have changes in your hair. These can include thickening of your hair, rapid growth, and changes in texture. Some women also have hair loss during or after pregnancy, or hair that feels dry or thin. Your hair will most likely return to normal after your baby is born.  Your breasts will continue to grow and be tender. A yellow discharge may leak from your breasts called colostrum.  Your belly button may stick out.  You may feel short of breath because of your expanding uterus.  You may notice the fetus "dropping," or moving lower  in your abdomen.  You may have a bloody mucus discharge. This usually occurs a few days to a week before labor begins.  Your cervix becomes thin and soft (effaced) near your due date. WHAT TO EXPECT AT YOUR PRENATAL EXAMS  You will have prenatal exams every 2 weeks until week 36. Then, you will have weekly prenatal exams. During a routine prenatal visit:  You will be weighed to make sure you and the fetus are growing normally.  Your blood pressure is taken.  Your abdomen will be measured to track your baby's growth.  The fetal heartbeat will be listened to.  Any test results from the previous visit  will be discussed.  You may have a cervical check near your due date to see if you have effaced. At around 36 weeks, your caregiver will check your cervix. At the same time, your caregiver will also perform a test on the secretions of the vaginal tissue. This test is to determine if a type of bacteria, Group B streptococcus, is present. Your caregiver will explain this further. Your caregiver may ask you:  What your birth plan is.  How you are feeling.  If you are feeling the baby move.  If you have had any abnormal symptoms, such as leaking fluid, bleeding, severe headaches, or abdominal cramping.  If you have any questions. Other tests or screenings that may be performed during your third trimester include:  Blood tests that check for low iron levels (anemia).  Fetal testing to check the health, activity level, and growth of the fetus. Testing is done if you have certain medical conditions or if there are problems during the pregnancy. FALSE LABOR You may feel small, irregular contractions that eventually go away. These are called Braxton Hicks contractions, or false labor. Contractions may last for hours, days, or even weeks before true labor sets in. If contractions come at regular intervals, intensify, or become painful, it is best to be seen by your caregiver.  SIGNS OF LABOR   Menstrual-like cramps.  Contractions that are 5 minutes apart or less.  Contractions that start on the top of the uterus and spread down to the lower abdomen and back.  A sense of increased pelvic pressure or back pain.  A watery or bloody mucus discharge that comes from the vagina. If you have any of these signs before the 37th week of pregnancy, call your caregiver right away. You need to go to the hospital to get checked immediately. HOME CARE INSTRUCTIONS   Avoid all smoking, herbs, alcohol, and unprescribed drugs. These chemicals affect the formation and growth of the baby.  Follow your  caregiver's instructions regarding medicine use. There are medicines that are either safe or unsafe to take during pregnancy.  Exercise only as directed by your caregiver. Experiencing uterine cramps is a good sign to stop exercising.  Continue to eat regular, healthy meals.  Wear a good support bra for breast tenderness.  Do not use hot tubs, steam rooms, or saunas.  Wear your seat belt at all times when driving.  Avoid raw meat, uncooked cheese, cat litter boxes, and soil used by cats. These carry germs that can cause birth defects in the baby.  Take your prenatal vitamins.  Try taking a stool softener (if your caregiver approves) if you develop constipation. Eat more high-fiber foods, such as fresh vegetables or fruit and whole grains. Drink plenty of fluids to keep your urine clear or pale yellow.  Take warm sitz baths   to soothe any pain or discomfort caused by hemorrhoids. Use hemorrhoid cream if your caregiver approves.  If you develop varicose veins, wear support hose. Elevate your feet for 15 minutes, 3-4 times a day. Limit salt in your diet.  Avoid heavy lifting, wear low heal shoes, and practice good posture.  Rest a lot with your legs elevated if you have leg cramps or low back pain.  Visit your dentist if you have not gone during your pregnancy. Use a soft toothbrush to brush your teeth and be gentle when you floss.  A sexual relationship may be continued unless your caregiver directs you otherwise.  Do not travel far distances unless it is absolutely necessary and only with the approval of your caregiver.  Take prenatal classes to understand, practice, and ask questions about the labor and delivery.  Make a trial run to the hospital.  Pack your hospital bag.  Prepare the baby's nursery.  Continue to go to all your prenatal visits as directed by your caregiver. SEEK MEDICAL CARE IF:  You are unsure if you are in labor or if your water has broken.  You have  dizziness.  You have mild pelvic cramps, pelvic pressure, or nagging pain in your abdominal area.  You have persistent nausea, vomiting, or diarrhea.  You have a bad smelling vaginal discharge.  You have pain with urination. SEEK IMMEDIATE MEDICAL CARE IF:   You have a fever.  You are leaking fluid from your vagina.  You have spotting or bleeding from your vagina.  You have severe abdominal cramping or pain.  You have rapid weight loss or gain.  You have shortness of breath with chest pain.  You notice sudden or extreme swelling of your face, hands, ankles, feet, or legs.  You have not felt your baby move in over an hour.  You have severe headaches that do not go away with medicine.  You have vision changes. Document Released: 02/26/2001 Document Revised: 03/09/2013 Document Reviewed: 05/05/2012 [facility] Patient Information 2015 [place], [place]. This information is not intended to replace advice given to you by your health care provider. Make sure you discuss any questions you have with your health care provider.

## 2017-11-28 NOTE — Addendum Note (Signed)
Addended by: Gaylyn Rong A on: 11/28/2017 12:18 PM   Modules accepted: Orders

## 2017-11-29 LAB — ANTIBODY SCREEN: Antibody Screen: NEGATIVE

## 2017-11-29 LAB — GLUCOSE TOLERANCE, 2 HOURS W/ 1HR
GLUCOSE, 2 HOUR: 91 mg/dL (ref 65–152)
Glucose, 1 hour: 194 mg/dL — ABNORMAL HIGH (ref 65–179)
Glucose, Fasting: 74 mg/dL (ref 65–91)

## 2017-11-29 LAB — CBC
HEMATOCRIT: 31.6 % — AB (ref 34.0–46.6)
Hemoglobin: 10.4 g/dL — ABNORMAL LOW (ref 11.1–15.9)
MCH: 28.7 pg (ref 26.6–33.0)
MCHC: 32.9 g/dL (ref 31.5–35.7)
MCV: 87 fL (ref 79–97)
Platelets: 279 10*3/uL (ref 150–450)
RBC: 3.63 x10E6/uL — ABNORMAL LOW (ref 3.77–5.28)
RDW: 13.3 % (ref 12.3–15.4)
WBC: 12.6 10*3/uL — ABNORMAL HIGH (ref 3.4–10.8)

## 2017-11-29 LAB — HIV ANTIBODY (ROUTINE TESTING W REFLEX): HIV Screen 4th Generation wRfx: NONREACTIVE

## 2017-11-29 LAB — RPR: RPR: NONREACTIVE

## 2017-12-02 ENCOUNTER — Other Ambulatory Visit: Payer: Self-pay | Admitting: Women's Health

## 2017-12-02 DIAGNOSIS — O0990 Supervision of high risk pregnancy, unspecified, unspecified trimester: Secondary | ICD-10-CM

## 2017-12-02 DIAGNOSIS — O24410 Gestational diabetes mellitus in pregnancy, diet controlled: Secondary | ICD-10-CM

## 2017-12-02 MED ORDER — FERROUS SULFATE 325 (65 FE) MG PO TABS: 325.0000 mg | ORAL_TABLET | Freq: Two times a day (BID) | ORAL | 3 refills | Status: DC

## 2017-12-03 ENCOUNTER — Telehealth: Payer: Self-pay | Admitting: *Deleted

## 2017-12-03 NOTE — Telephone Encounter (Signed)
Patient informed she has GDM.  Patient stated she already got a call from the dietician and is not going to take the class nor check her blood sugars.  States she had this before and is not going through all of this again.  Discussed with the patient the importance of monitoring her blood sugar and going to the class but stated she was "not doing the extra stuff".  Informed I was going to have to remove her from babyscripts optimized and patient was very upset stating "I have to work and cannot come more frequently."  Patient stated she ate healthy and only drank water.  Discussed with her that diabetes did not discriminate and did not matter her size or how she ate.  Informed patient she could discuss any concerns that she had at her next appt.  Stated she would.   Meter, lancets and strips called to pharmacy.

## 2017-12-26 ENCOUNTER — Encounter: Payer: Medicaid Other | Admitting: Obstetrics & Gynecology

## 2018-01-02 ENCOUNTER — Encounter: Payer: Self-pay | Admitting: Obstetrics & Gynecology

## 2018-01-02 ENCOUNTER — Other Ambulatory Visit: Payer: Self-pay

## 2018-01-02 ENCOUNTER — Ambulatory Visit (INDEPENDENT_AMBULATORY_CARE_PROVIDER_SITE_OTHER): Payer: Medicaid Other | Admitting: Obstetrics & Gynecology

## 2018-01-02 VITALS — BP 119/73 | HR 109 | Wt 209.0 lb

## 2018-01-02 DIAGNOSIS — Z1389 Encounter for screening for other disorder: Secondary | ICD-10-CM

## 2018-01-02 DIAGNOSIS — O99810 Abnormal glucose complicating pregnancy: Secondary | ICD-10-CM

## 2018-01-02 DIAGNOSIS — O0993 Supervision of high risk pregnancy, unspecified, third trimester: Secondary | ICD-10-CM

## 2018-01-02 DIAGNOSIS — O24419 Gestational diabetes mellitus in pregnancy, unspecified control: Secondary | ICD-10-CM

## 2018-01-02 DIAGNOSIS — Z331 Pregnant state, incidental: Secondary | ICD-10-CM

## 2018-01-02 DIAGNOSIS — R81 Glycosuria: Secondary | ICD-10-CM

## 2018-01-02 DIAGNOSIS — Z3A30 30 weeks gestation of pregnancy: Secondary | ICD-10-CM

## 2018-01-02 LAB — POCT URINALYSIS DIPSTICK OB
Blood, UA: NEGATIVE
KETONES UA: NEGATIVE
LEUKOCYTES UA: NEGATIVE
NITRITE UA: NEGATIVE

## 2018-01-02 LAB — GLUCOSE, POCT (MANUAL RESULT ENTRY): POC GLUCOSE: 138 mg/dL — AB (ref 70–99)

## 2018-01-02 MED ORDER — ACCU-CHEK AVIVA DEVI: 0 refills | Status: DC

## 2018-01-02 MED ORDER — ACCU-CHEK FASTCLIX LANCETS MISC: 1.0000 | Freq: Four times a day (QID) | 12 refills | Status: DC

## 2018-01-02 MED ORDER — GLUCOSE BLOOD VI STRP: ORAL_STRIP | 12 refills | Status: DC

## 2018-01-02 NOTE — Progress Notes (Signed)
HIGH-RISK PREGNANCY VISIT Patient name: Kimberly Olsen MRN [id number]  Date of birth: 1990/10/18 Chief Complaint:   High Risk Gestation  History of Present Illness:   Kimberly Olsen is a 27 y.o. G74P2002 female at [redacted]w[redacted]d with an Estimated Date of Delivery: 03/09/18 being seen today for ongoing management of a high-risk pregnancy complicated by gestational DM.  Today she reports no complaints. Contractions: Not present. Vag. Bleeding: None.  Movement: Present. denies leaking of fluid.  Review of Systems:   Pertinent items are noted in HPI Denies abnormal vaginal discharge w/ itching/odor/irritation, headaches, visual changes, shortness of breath, chest pain, abdominal pain, severe nausea/vomiting, or problems with urination or bowel movements unless otherwise stated above. Pertinent History Reviewed:  Reviewed past medical,surgical, social, obstetrical and family history.  Reviewed problem list, medications and allergies. Physical Assessment:   Vitals:   01/02/18 1327  BP: 119/73  Pulse: (!) 109  Weight: 209 lb (94.8 kg)  Body mass index is 32.54 kg/m.           Physical Examination:   General appearance: alert, well appearing, and in no distress  Mental status: alert, oriented to person, place, and time  Skin: warm & dry   Extremities: Edema: None    Cardiovascular: normal heart rate noted  Respiratory: normal respiratory effort, no distress  Abdomen: gravid, soft, non-tender  Pelvic: Cervical exam deferred         Fetal Status: Fetal Heart Rate (bpm): 142 Fundal Height: 30 cm Movement: Present    Fetal Surveillance Testing today: FHR 142   Results for orders placed or performed in visit on 01/02/18 (from the past 24 hour(s))  POC Urinalysis Dipstick OB   Collection Time: 01/02/18  1:31 PM  Result Value Ref Range   Color, UA     Clarity, UA     Glucose, UA Moderate (2+) (A) Negative   Bilirubin, UA     Ketones, UA neg    Spec Grav, UA     Blood, UA neg    pH, UA       POC Protein UA Trace Negative, Trace   Urobilinogen, UA     Nitrite, UA neg    Leukocytes, UA Negative Negative   Appearance     Odor    POCT glucose (manual entry)   Collection Time: 01/02/18  1:32 PM  Result Value Ref Range   POC Glucose 138 (A) 70 - 99 mg/dl    Assessment & Plan:  1) High-risk pregnancy G3P2002 at [redacted]w[redacted]d with an Estimated Date of Delivery: 03/09/18   2) Class A1 DM, unstable, pt has declined to check CBG up until this point, I discussed the importance of it for her pregnancy's safety.  Prescription sent in and I think she will start checking it    Meds:  Meds ordered this encounter  Medications  . glucose blood test strip    Sig: Use as instructed    Dispense:  100 each    Refill:  12  . Blood Glucose Monitoring Suppl (ACCU-CHEK AVIVA) device    Sig: Use as instructed    Dispense:  1 each    Refill:  0  . ACCU-CHEK FASTCLIX LANCETS MISC    Sig: 1 Device by Percutaneous route 4 (four) times daily.    Dispense:  100 each    Refill:  12    Labs/procedures today: CBG 138  Treatment Plan:  Will send in scripts for meter and lancets test strips  Reviewed:  Preterm labor symptoms and general obstetric precautions including but not limited to vaginal bleeding, contractions, leaking of fluid and fetal movement were reviewed in detail with the patient.  All questions were answered.  Follow-up: Return in about 2 weeks (around 01/16/2018) for Industry.  Orders Placed This Encounter  Procedures  . POC Urinalysis Dipstick OB  . POCT glucose (manual entry)   Florian Buff  01/02/2018 1:44 PM

## 2018-01-16 ENCOUNTER — Encounter: Payer: Medicaid Other | Admitting: Women's Health

## 2018-01-22 ENCOUNTER — Encounter: Payer: Medicaid Other | Admitting: Women's Health

## 2018-01-30 ENCOUNTER — Ambulatory Visit (INDEPENDENT_AMBULATORY_CARE_PROVIDER_SITE_OTHER): Payer: Medicaid Other | Admitting: Women's Health

## 2018-01-30 ENCOUNTER — Encounter: Payer: Self-pay | Admitting: Women's Health

## 2018-01-30 VITALS — BP 120/68 | HR 93 | Temp 98.2°F | Wt 206.5 lb

## 2018-01-30 DIAGNOSIS — O0993 Supervision of high risk pregnancy, unspecified, third trimester: Secondary | ICD-10-CM

## 2018-01-30 DIAGNOSIS — O24410 Gestational diabetes mellitus in pregnancy, diet controlled: Secondary | ICD-10-CM

## 2018-01-30 DIAGNOSIS — O0990 Supervision of high risk pregnancy, unspecified, unspecified trimester: Secondary | ICD-10-CM

## 2018-01-30 DIAGNOSIS — Z1389 Encounter for screening for other disorder: Secondary | ICD-10-CM

## 2018-01-30 DIAGNOSIS — Z331 Pregnant state, incidental: Secondary | ICD-10-CM

## 2018-01-30 DIAGNOSIS — Z3A34 34 weeks gestation of pregnancy: Secondary | ICD-10-CM

## 2018-01-30 LAB — POCT URINALYSIS DIPSTICK OB
GLUCOSE, UA: NEGATIVE
Nitrite, UA: NEGATIVE
POC,PROTEIN,UA: NEGATIVE
RBC UA: NEGATIVE

## 2018-01-30 NOTE — Patient Instructions (Addendum)
Kimberly Olsen, I greatly value your feedback.  If you receive a survey following your visit with [place] today, we appreciate you taking the time to fill it out.  Thanks, [name], CNM, WHNP-BC  You have a viral infection that will resolve on its own over time.  Symptoms typically last 3-7 days but can stretch out to 2-3 weeks.  Unfortunately, antibiotics are not helpful for viral infections.  Humidifier and saline nasal spray for nasal congestion  Regular robitussin, cough drops for cough  Warm salt water gargles for sore throat  Mucinex with lots of water to help you cough up the mucous in your chest if needed  Drink plenty of fluids and stay hydrated!  Wash your hands frequently.  Call if you are not improving by 7-10 days.      Call the office [phone]) or go to [facility] if:  You begin to have strong, frequent contractions  Your water breaks.  Sometimes it is a big gush of fluid, sometimes it is just a trickle that keeps getting your panties wet or running down your legs  You have vaginal bleeding.  It is normal to have a small amount of spotting if your cervix was checked.   You don't feel your baby moving like normal.  If you don't, get you something to eat and drink and lay down and focus on feeling your baby move.  You should feel at least 10 movements in 2 hours.  If you don't, you should call the office or go to [facility].    Tdap Vaccine  It is recommended that you get the Tdap vaccine during the third trimester of EACH pregnancy to help protect your baby from getting pertussis (whooping cough)  27-36 weeks is the BEST time to do this so that you can pass the protection on to your baby. During pregnancy is better than after pregnancy, but if you are unable to get it during pregnancy it will be offered at the hospital.   You can get this vaccine with [place], at the health department, your family doctor, or some local pharmacies  Everyone who will be around  your baby should also be up-to-date on their vaccines before the baby comes. Adults (who are not pregnant) only need 1 dose of Tdap during adulthood.   Third Trimester of Pregnancy The third trimester is from week 29 through week 42, months 7 through 9. The third trimester is a time when the fetus is growing rapidly. At the end of the ninth month, the fetus is about 20 inches in length and weighs 6-10 pounds.  BODY CHANGES Your body goes through many changes during pregnancy. The changes vary from woman to woman.   Your weight will continue to increase. You can expect to gain 25-35 pounds (11-16 kg) by the end of the pregnancy.  You may begin to get stretch marks on your hips, abdomen, and breasts.  You may urinate more often because the fetus is moving lower into your pelvis and pressing on your bladder.  You may develop or continue to have heartburn as a result of your pregnancy.  You may develop constipation because certain hormones are causing the muscles that push waste through your intestines to slow down.  You may develop hemorrhoids or swollen, bulging veins (varicose veins).  You may have pelvic pain because of the weight gain and pregnancy hormones relaxing your joints between the bones in your pelvis. Backaches may result from overexertion of the  muscles supporting your posture.  You may have changes in your hair. These can include thickening of your hair, rapid growth, and changes in texture. Some women also have hair loss during or after pregnancy, or hair that feels dry or thin. Your hair will most likely return to normal after your baby is born.  Your breasts will continue to grow and be tender. A yellow discharge may leak from your breasts called colostrum.  Your belly button may stick out.  You may feel short of breath because of your expanding uterus.  You may notice the fetus "dropping," or moving lower in your abdomen.  You may have a bloody mucus discharge. This  usually occurs a few days to a week before labor begins.  Your cervix becomes thin and soft (effaced) near your due date. WHAT TO EXPECT AT YOUR PRENATAL EXAMS  You will have prenatal exams every 2 weeks until week 36. Then, you will have weekly prenatal exams. During a routine prenatal visit:  You will be weighed to make sure you and the fetus are growing normally.  Your blood pressure is taken.  Your abdomen will be measured to track your baby's growth.  The fetal heartbeat will be listened to.  Any test results from the previous visit will be discussed.  You may have a cervical check near your due date to see if you have effaced. At around 36 weeks, your caregiver will check your cervix. At the same time, your caregiver will also perform a test on the secretions of the vaginal tissue. This test is to determine if a type of bacteria, Group B streptococcus, is present. Your caregiver will explain this further. Your caregiver may ask you:  What your birth plan is.  How you are feeling.  If you are feeling the baby move.  If you have had any abnormal symptoms, such as leaking fluid, bleeding, severe headaches, or abdominal cramping.  If you have any questions. Other tests or screenings that may be performed during your third trimester include:  Blood tests that check for low iron levels (anemia).  Fetal testing to check the health, activity level, and growth of the fetus. Testing is done if you have certain medical conditions or if there are problems during the pregnancy. FALSE LABOR You may feel small, irregular contractions that eventually go away. These are called Braxton Hicks contractions, or false labor. Contractions may last for hours, days, or even weeks before true labor sets in. If contractions come at regular intervals, intensify, or become painful, it is best to be seen by your caregiver.  SIGNS OF LABOR   Menstrual-like cramps.  Contractions that are 5 minutes  apart or less.  Contractions that start on the top of the uterus and spread down to the lower abdomen and back.  A sense of increased pelvic pressure or back pain.  A watery or bloody mucus discharge that comes from the vagina. If you have any of these signs before the 37th week of pregnancy, call your caregiver right away. You need to go to the hospital to get checked immediately. HOME CARE INSTRUCTIONS   Avoid all smoking, herbs, alcohol, and unprescribed drugs. These chemicals affect the formation and growth of the baby.  Follow your caregiver's instructions regarding medicine use. There are medicines that are either safe or unsafe to take during pregnancy.  Exercise only as directed by your caregiver. Experiencing uterine cramps is a good sign to stop exercising.  Continue to eat regular, healthy  meals.  Wear a good support bra for breast tenderness.  Do not use hot tubs, steam rooms, or saunas.  Wear your seat belt at all times when driving.  Avoid raw meat, uncooked cheese, cat litter boxes, and soil used by cats. These carry germs that can cause birth defects in the baby.  Take your prenatal vitamins.  Try taking a stool softener (if your caregiver approves) if you develop constipation. Eat more high-fiber foods, such as fresh vegetables or fruit and whole grains. Drink plenty of fluids to keep your urine clear or pale yellow.  Take warm sitz baths to soothe any pain or discomfort caused by hemorrhoids. Use hemorrhoid cream if your caregiver approves.  If you develop varicose veins, wear support hose. Elevate your feet for 15 minutes, 3-4 times a day. Limit salt in your diet.  Avoid heavy lifting, wear low heal shoes, and practice good posture.  Rest a lot with your legs elevated if you have leg cramps or low back pain.  Visit your dentist if you have not gone during your pregnancy. Use a soft toothbrush to brush your teeth and be gentle when you floss.  A sexual  relationship may be continued unless your caregiver directs you otherwise.  Do not travel far distances unless it is absolutely necessary and only with the approval of your caregiver.  Take prenatal classes to understand, practice, and ask questions about the labor and delivery.  Make a trial run to the hospital.  Pack your hospital bag.  Prepare the baby's nursery.  Continue to go to all your prenatal visits as directed by your caregiver. SEEK MEDICAL CARE IF:  You are unsure if you are in labor or if your water has broken.  You have dizziness.  You have mild pelvic cramps, pelvic pressure, or nagging pain in your abdominal area.  You have persistent nausea, vomiting, or diarrhea.  You have a bad smelling vaginal discharge.  You have pain with urination. SEEK IMMEDIATE MEDICAL CARE IF:   You have a fever.  You are leaking fluid from your vagina.  You have spotting or bleeding from your vagina.  You have severe abdominal cramping or pain.  You have rapid weight loss or gain.  You have shortness of breath with chest pain.  You notice sudden or extreme swelling of your face, hands, ankles, feet, or legs.  You have not felt your baby move in over an hour.  You have severe headaches that do not go away with medicine.  You have vision changes. Document Released: 02/26/2001 Document Revised: 03/09/2013 Document Reviewed: 05/05/2012 [facility] Patient Information 2015 [place], [place]. This information is not intended to replace advice given to you by your health care provider. Make sure you discuss any questions you have with your health care provider.

## 2018-01-30 NOTE — Progress Notes (Signed)
   HIGH-RISK PREGNANCY VISIT Patient name: Kimberly Olsen MRN [id number]  Date of birth: [birth year] Chief Complaint:   High Risk Gestation (sorethroat x 1 day)  History of Present Illness:   Kimberly Olsen is a 27 y.o. G41P2002 female at [redacted]w[redacted]d with an Estimated Date of Delivery: 03/09/18 being seen today for ongoing management of a high-risk pregnancy complicated by S8NI.  Today she reports started checking sugars after last visit, forgot log, reports all FBS <95, all 2hr PP <120 except one 128 (d/t diet). Wants inpatient BTL. Sore throat x 1 day, wants to know what can take OTC. Contractions: Not present. Vag. Bleeding: None.  Movement: Present. denies leaking of fluid.  Review of Systems:   Pertinent items are noted in HPI Denies abnormal vaginal discharge w/ itching/odor/irritation, headaches, visual changes, shortness of breath, chest pain, abdominal pain, severe nausea/vomiting, or problems with urination or bowel movements unless otherwise stated above. Pertinent History Reviewed:  Reviewed past medical,surgical, social, obstetrical and family history.  Reviewed problem list, medications and allergies. Physical Assessment:   Vitals:   01/30/18 1242  BP: 120/68  Pulse: 93  Temp: 98.2 F (36.8 C)  Weight: 206 lb 8 oz (93.7 kg)  Body mass index is 32.15 kg/m.           Physical Examination:   General appearance: alert, well appearing, and in no distress  Mental status: alert, oriented to person, place, and time  Skin: warm & dry   Extremities: Edema: None    Cardiovascular: normal heart rate noted  Respiratory: normal respiratory effort, no distress  Abdomen: gravid, soft, non-tender  Pelvic: Cervical exam deferred         Fetal Status: Fetal Heart Rate (bpm): 130 Fundal Height: 32 cm Movement: Present    Fetal Surveillance Testing today: doppler   Results for orders placed or performed in visit on 01/30/18 (from the past 24 hour(s))  POC Urinalysis Dipstick OB   Collection Time: 01/30/18 12:42 PM  Result Value Ref Range   Color, UA     Clarity, UA     Glucose, UA Negative Negative   Bilirubin, UA     Ketones, UA 1+    Spec Grav, UA     Blood, UA neg    pH, UA     POC,PROTEIN,UA Negative Negative, Trace, Small (1+), Moderate (2+), Large (3+), 4+   Urobilinogen, UA     Nitrite, UA neg    Leukocytes, UA Small (1+) (A) Negative   Appearance     Odor      Assessment & Plan:  1) High-risk pregnancy G3P2002 at [redacted]w[redacted]d with an Estimated Date of Delivery: 03/09/18   2) A1DM, stable, now checking sugars, to bring log to all appts  3) Desires BTL, discussed risks/benefits, consent signed today  4) Sore throat> gave printed relief measures  Meds: No orders of the defined types were placed in this encounter.   Labs/procedures today: none  Treatment Plan:  EFW next visit, IOL @ 40wks  Reviewed: Preterm labor symptoms and general obstetric precautions including but not limited to vaginal bleeding, contractions, leaking of fluid and fetal movement were reviewed in detail with the patient.  All questions were answered.  Follow-up: Return in about 2 weeks (around 02/13/2018) for HROB, US:EFW, Sign BTL consent today.  Orders Placed This Encounter  Procedures  . US OB Follow Up  . POC Urinalysis Dipstick OB   Roma Schanz CNM, [facility] 01/30/2018 1:22 PM

## 2018-02-19 ENCOUNTER — Ambulatory Visit (INDEPENDENT_AMBULATORY_CARE_PROVIDER_SITE_OTHER): Payer: Medicaid Other | Admitting: Women's Health

## 2018-02-19 ENCOUNTER — Encounter: Payer: Self-pay | Admitting: Women's Health

## 2018-02-19 ENCOUNTER — Ambulatory Visit (INDEPENDENT_AMBULATORY_CARE_PROVIDER_SITE_OTHER): Payer: Medicaid Other

## 2018-02-19 ENCOUNTER — Other Ambulatory Visit: Payer: Medicaid Other

## 2018-02-19 VITALS — BP 126/74 | HR 98 | Wt 209.0 lb

## 2018-02-19 DIAGNOSIS — O24410 Gestational diabetes mellitus in pregnancy, diet controlled: Secondary | ICD-10-CM

## 2018-02-19 DIAGNOSIS — O0993 Supervision of high risk pregnancy, unspecified, third trimester: Secondary | ICD-10-CM

## 2018-02-19 DIAGNOSIS — Z331 Pregnant state, incidental: Secondary | ICD-10-CM

## 2018-02-19 DIAGNOSIS — Z1389 Encounter for screening for other disorder: Secondary | ICD-10-CM

## 2018-02-19 DIAGNOSIS — Z3A37 37 weeks gestation of pregnancy: Secondary | ICD-10-CM

## 2018-02-19 DIAGNOSIS — O99013 Anemia complicating pregnancy, third trimester: Secondary | ICD-10-CM | POA: Diagnosis not present

## 2018-02-19 DIAGNOSIS — O0990 Supervision of high risk pregnancy, unspecified, unspecified trimester: Secondary | ICD-10-CM

## 2018-02-19 LAB — POCT URINALYSIS DIPSTICK OB
Glucose, UA: NEGATIVE
Ketones, UA: NEGATIVE
NITRITE UA: NEGATIVE
POC,PROTEIN,UA: NEGATIVE
RBC UA: NEGATIVE

## 2018-02-19 LAB — OB RESULTS CONSOLE GBS: GBS: POSITIVE

## 2018-02-19 LAB — POCT HEMOGLOBIN: HEMOGLOBIN: 9.4 g/dL — AB (ref 9.5–13.5)

## 2018-02-19 LAB — OB RESULTS CONSOLE GC/CHLAMYDIA: Gonorrhea: NEGATIVE

## 2018-02-19 MED ORDER — FERROUS SULFATE 325 (65 FE) MG PO TABS: 325.0000 mg | ORAL_TABLET | Freq: Two times a day (BID) | ORAL | 3 refills | Status: DC

## 2018-02-19 NOTE — Patient Instructions (Addendum)
Kimberly Olsen, I greatly value your feedback.  If you receive a survey following your visit with [place] today, we appreciate you taking the time to fill it out.  Thanks, [name], CNM, WHNP-BC   Call the office [phone]) or go to [facility] if:  You begin to have strong, frequent contractions  Your water breaks.  Sometimes it is a big gush of fluid, sometimes it is just a trickle that keeps getting your panties wet or running down your legs  You have vaginal bleeding.  It is normal to have a small amount of spotting if your cervix was checked.   You don't feel your baby moving like normal.  If you don't, get you something to eat and drink and lay down and focus on feeling your baby move.  You should feel at least 10 movements in 2 hours.  If you don't, you should call the office or go to [place] to Help Leg Cramps  Increase dietary sources of calcium (milk, yogurt, cheese, leafy greens, seafood, legumes, and fruit) and magnesium (dark leafy greens, nuts, seeds, fish, beans, whole grains, avocados, yogurt, bananas, dried fruit, dark chocolate)  Spoonful of regular yellow mustard every night  Pickle juice  Magnesium supplement: 64mol in the morning, 173ml at night (can find in the vitamin aisle)  Dorsiflexion of foot: pointing your toes back towards your knee during the cramp        Braxton Hicks Contractions Contractions of the uterus can occur throughout pregnancy, but they are not always a sign that you are in labor. You may have practice contractions called Braxton Hicks contractions. These false labor contractions are sometimes confused with true labor. What are BrMontine Circleontractions? Braxton Hicks contractions are tightening movements that occur in the muscles of the uterus before labor. Unlike true labor contractions, these contractions do not result in opening (dilation) and thinning of the cervix. Toward the end of pregnancy (32-34 weeks), Braxton  Hicks contractions can happen more often and may become stronger. These contractions are sometimes difficult to tell apart from true labor because they can be very uncomfortable. You should not feel embarrassed if you go to the hospital with false labor. Sometimes, the only way to tell if you are in true labor is for your health care provider to look for changes in the cervix. The health care provider will do a physical exam and may monitor your contractions. If you are not in true labor, the exam should show that your cervix is not dilating and your water has not broken. If there are other health problems associated with your pregnancy, it is completely safe for you to be sent home with false labor. You may continue to have Braxton Hicks contractions until you go into true labor. How to tell the difference between true labor and false labor True labor  Contractions last 30-70 seconds.  Contractions become very regular.  Discomfort is usually felt in the top of the uterus, and it spreads to the lower abdomen and low back.  Contractions do not go away with walking.  Contractions usually become more intense and increase in frequency.  The cervix dilates and gets thinner. False labor  Contractions are usually shorter and not as strong as true labor contractions.  Contractions are usually irregular.  Contractions are often felt in the front of the lower abdomen and in the groin.  Contractions may go away when you walk around or change positions while lying down.  Contractions get weaker and are shorter-lasting as time goes on.  The cervix usually does not dilate or become thin. Follow these instructions at home:  Take over-the-counter and prescription medicines only as told by your health care provider.  Keep up with your usual exercises and follow other instructions from your health care provider.  Eat and drink lightly if you think you are going into labor.  If Braxton Hicks  contractions are making you uncomfortable: ? Change your position from lying down or resting to walking, or change from walking to resting. ? Sit and rest in a tub of warm water. ? Drink enough fluid to keep your urine pale yellow. Dehydration may cause these contractions. ? Do slow and deep breathing several times an hour.  Keep all follow-up prenatal visits as told by your health care provider. This is important. Contact a health care provider if:  You have a fever.  You have continuous pain in your abdomen. Get help right away if:  Your contractions become stronger, more regular, and closer together.  You have fluid leaking or gushing from your vagina.  You pass blood-tinged mucus (bloody show).  You have bleeding from your vagina.  You have low back pain that you never had before.  You feel your baby's head pushing down and causing pelvic pressure.  Your baby is not moving inside you as much as it used to. Summary  Contractions that occur before labor are called Braxton Hicks contractions, false labor, or practice contractions.  Braxton Hicks contractions are usually shorter, weaker, farther apart, and less regular than true labor contractions. True labor contractions usually become progressively stronger and regular and they become more frequent.  Manage discomfort from [facility] contractions by changing position, resting in a warm bath, drinking plenty of water, or practicing deep breathing. This information is not intended to replace advice given to you by your health care provider. Make sure you discuss any questions you have with your health care provider. Document Released: 07/18/2016 Document Revised: 07/18/2016 Document Reviewed: 07/18/2016 Elsevier Interactive Patient Education  2018 [employer].   Iron-Rich Diet Iron is a mineral that helps your body to produce hemoglobin. Hemoglobin is a protein in your red blood cells that carries oxygen to your body's  tissues. Eating too little iron may cause you to feel weak and tired, and it can increase your risk for infection. Eating enough iron is necessary for your body's metabolism, muscle function, and nervous system. Iron is naturally found in many foods. It can also be added to foods or fortified in foods. There are two types of dietary iron:  Heme iron. Heme iron is absorbed by the body more easily than nonheme iron. Heme iron is found in meat, poultry, and fish.  Nonheme iron. Nonheme iron is found in dietary supplements, iron-fortified grains, beans, and vegetables.  You may need to follow an iron-rich diet if:  You have been diagnosed with iron deficiency or iron-deficiency anemia.  You have a condition that prevents you from absorbing dietary iron, such as: ? Infection in your intestines. ? Celiac disease. This involves long-lasting (chronic) inflammation of your intestines.  You do not eat enough iron.  You eat a diet that is high in foods that impair iron absorption.  You have lost a lot of blood.  You have heavy bleeding during your menstrual cycle.  You are pregnant.  What is my plan? Your health care provider may help you to determine how much iron you need  per day based on your condition. Generally, when a person consumes sufficient amounts of iron in the diet, the following iron needs are met:  Men. ? 77-36 years old: 11 mg per day. ? 40-25 years old: 8 mg per day.  Women. ? 19-14 years old: 15 mg per day. ? 83-26 years old: 18 mg per day. ? Over 60 years old: 8 mg per day. ? Pregnant women: 27 mg per day. ? Breastfeeding women: 9 mg per day.  What do I need to know about an iron-rich diet?  Eat fresh fruits and vegetables that are high in vitamin C along with foods that are high in iron. This will help increase the amount of iron that your body absorbs from food, especially with foods containing nonheme iron. Foods that are high in vitamin C include oranges, peppers,  tomatoes, and mango.  Take iron supplements only as directed by your health care provider. Overdose of iron can be life-threatening. If you were prescribed iron supplements, take them with orange juice or a vitamin C supplement.  Cook foods in pots and pans that are made from iron.  Eat nonheme iron-containing foods alongside foods that are high in heme iron. This helps to improve your iron absorption.  Certain foods and drinks contain compounds that impair iron absorption. Avoid eating these foods in the same meal as iron-rich foods or with iron supplements. These include: ? Coffee, black tea, and red wine. ? Milk, dairy products, and foods that are high in calcium. ? Beans, soybeans, and peas. ? Whole grains.  When eating foods that contain both nonheme iron and compounds that impair iron absorption, follow these tips to absorb iron better. ? Soak beans overnight before cooking. ? Soak whole grains overnight and drain them before using. ? Ferment flours before baking, such as using yeast in bread dough. What foods can I eat? Grains Iron-fortified breakfast cereal. Iron-fortified whole-wheat bread. Enriched rice. Sprouted grains. Vegetables Spinach. Potatoes with skin. Green peas. Broccoli. Red and green bell peppers. Fermented vegetables. Fruits Prunes. Raisins. Oranges. Strawberries. Mango. Grapefruit. Meats and Other Protein Sources Beef liver. Oysters. Beef. Shrimp. [place]. Chicken. [place]. Sardines. Chickpeas. Nuts. Tofu. Beverages Tomato juice. Fresh orange juice. Prune juice. Hibiscus tea. Fortified instant breakfast shakes. Condiments Tahini. Fermented soy sauce. Sweets and Desserts Black-strap molasses. Other Wheat germ. The items listed above may not be a complete list of recommended foods or beverages. Contact your dietitian for more options. What foods are not recommended? Grains Whole grains. Bran cereal. Bran flour. Oats. Vegetables Artichokes. Brussels sprouts.  Kale. Fruits Blueberries. Raspberries. Strawberries. Figs. Meats and Other Protein Sources Soybeans. Products made from soy protein. Dairy Milk. Cream. Cheese. Yogurt. Cottage cheese. Beverages Coffee. Black tea. Red wine. Sweets and Desserts Cocoa. Chocolate. Ice cream. Other Basil. Oregano. Parsley. The items listed above may not be a complete list of foods and beverages to avoid. Contact your dietitian for more information. This information is not intended to replace advice given to you by your health care provider. Make sure you discuss any questions you have with your health care provider. Document Released: 10/16/2004 Document Revised: 09/22/2015 Document Reviewed: 09/29/2013 Elsevier Interactive Patient Education  [employer].

## 2018-02-19 NOTE — Progress Notes (Signed)
[place] 79+9 wks,cephalic,afi 13 cm,posterior placenta gr 3,fhr 121 bpm,normal right ovary,left oophorectomy,EFW 3467g 80%

## 2018-02-19 NOTE — Progress Notes (Signed)
HIGH-RISK PREGNANCY VISIT Patient name: Kimberly Olsen MRN [id number]  Date of birth: 1990-12-16 Chief Complaint:   Routine Prenatal Visit ([place] today)  History of Present Illness:   Kimberly Olsen is a 27 y.o. G28P2002 female at [redacted]w[redacted]d with an Estimated Date of Delivery: 03/09/18 being seen today for ongoing management of a high-risk pregnancy complicated by S9FW.  Today she reports all sugars wnl. Bad leg cramps and restless legs. Has black eye (Rt), states got bad leg cramp in middle of night and tried to stand and fell. Denies abuse. Contractions: Not present. Vag. Bleeding: None.  Movement: Present. denies leaking of fluid.  Review of Systems:   Pertinent items are noted in HPI Denies abnormal vaginal discharge w/ itching/odor/irritation, headaches, visual changes, shortness of breath, chest pain, abdominal pain, severe nausea/vomiting, or problems with urination or bowel movements unless otherwise stated above. Pertinent History Reviewed:  Reviewed past medical,surgical, social, obstetrical and family history.  Reviewed problem list, medications and allergies. Physical Assessment:   Vitals:   02/19/18 1531  BP: 126/74  Pulse: 98  Weight: 209 lb (94.8 kg)  Body mass index is 32.54 kg/m.           Physical Examination:   General appearance: alert, well appearing, and in no distress  Mental status: alert, oriented to person, place, and time  Skin: warm & dry   Extremities: Edema: None    Cardiovascular: normal heart rate noted  Respiratory: normal respiratory effort, no distress  Abdomen: gravid, soft, non-tender  Pelvic: Cervical exam performed  Dilation: 1 Effacement (%): 50 Station: -2  Fetal Status: Fetal Heart Rate (bpm): 121 u/s   Movement: Present Presentation: Vertex  Fetal Surveillance Testing today: [place] 26+3 wks,cephalic,afi 13 cm,posterior placenta gr 3,fhr 121 bpm,normal right ovary,left oophorectomy,EFW 3467g 80%  Results for orders placed or performed in visit on  02/19/18 (from the past 24 hour(s))  POC Urinalysis Dipstick OB   Collection Time: 02/19/18  3:34 PM  Result Value Ref Range   Color, UA     Clarity, UA     Glucose, UA Negative Negative   Bilirubin, UA     Ketones, UA neg    Spec Grav, UA     Blood, UA neg    pH, UA     POC,PROTEIN,UA Negative Negative, Trace, Small (1+), Moderate (2+), Large (3+), 4+   Urobilinogen, UA     Nitrite, UA neg    Leukocytes, UA Small (1+) (A) Negative   Appearance     Odor    POCT hemoglobin   Collection Time: 02/19/18  4:06 PM  Result Value Ref Range   Hemoglobin 9.4 (A) 9.5 - 13.5 g/dL    Assessment & Plan:  1) High-risk pregnancy G3P2002 at [redacted]w[redacted]d with an Estimated Date of Delivery: 03/09/18   2) A1DM, stable, EFW 80%, normal AFI, discussed IOL @ 39-40wks, would rather do 40wks  3) Leg cramps> discussed and gave printed info  4) Restless legs> hgb 9.4, rx fe bid, increase fe-rich foods  5) Anemia> rx Fe BID  Meds:  Meds ordered this encounter  Medications  . ferrous sulfate 325 (65 FE) MG tablet    Sig: Take 1 tablet (325 mg total) by mouth 2 (two) times daily with a meal.    Dispense:  60 tablet    Refill:  3    Order Specific Question:   Supervising Provider    Answer:   Florian Buff [[id number]]   Labs/procedures today: efw/afi  u/s  Treatment Plan:  IOL @ 39-40wks  Reviewed: Term labor symptoms and general obstetric precautions including but not limited to vaginal bleeding, contractions, leaking of fluid and fetal movement were reviewed in detail with the patient.  All questions were answered.  Follow-up: Return in about 1 week (around 02/26/2018) for [name].  Orders Placed This Encounter  Procedures  . GC/Chlamydia Probe Amp([facility])  . Culture, beta strep (group b only)  . POC Urinalysis Dipstick OB  . POCT hemoglobin   Roma Schanz CNM, [facility] 02/19/2018 4:08 PM

## 2018-02-22 LAB — GC/CHLAMYDIA PROBE AMP
CHLAMYDIA, DNA PROBE: NEGATIVE
NEISSERIA GONORRHOEAE BY PCR: NEGATIVE

## 2018-02-22 LAB — CULTURE, BETA STREP (GROUP B ONLY): STREP GP B CULTURE: POSITIVE — AB

## 2018-02-27 ENCOUNTER — Encounter: Payer: Self-pay | Admitting: Obstetrics & Gynecology

## 2018-02-27 ENCOUNTER — Ambulatory Visit (INDEPENDENT_AMBULATORY_CARE_PROVIDER_SITE_OTHER): Payer: Medicaid Other | Admitting: Obstetrics & Gynecology

## 2018-02-27 VITALS — BP 118/72 | HR 106 | Wt 209.0 lb

## 2018-02-27 DIAGNOSIS — O0993 Supervision of high risk pregnancy, unspecified, third trimester: Secondary | ICD-10-CM | POA: Diagnosis not present

## 2018-02-27 DIAGNOSIS — Z331 Pregnant state, incidental: Secondary | ICD-10-CM

## 2018-02-27 DIAGNOSIS — O24410 Gestational diabetes mellitus in pregnancy, diet controlled: Secondary | ICD-10-CM | POA: Diagnosis not present

## 2018-02-27 DIAGNOSIS — Z1389 Encounter for screening for other disorder: Secondary | ICD-10-CM

## 2018-02-27 DIAGNOSIS — Z3A38 38 weeks gestation of pregnancy: Secondary | ICD-10-CM | POA: Diagnosis not present

## 2018-02-27 LAB — POCT URINALYSIS DIPSTICK OB
Glucose, UA: NEGATIVE
Ketones, UA: NEGATIVE
Nitrite, UA: NEGATIVE
POC,PROTEIN,UA: NEGATIVE

## 2018-02-27 NOTE — Progress Notes (Signed)
   HIGH-RISK PREGNANCY VISIT Patient name: Kimberly Olsen MRN [id number]  Date of birth: May 06, 1990 Chief Complaint:   High Risk Gestation  History of Present Illness:   Kimberly Olsen is a 27 y.o. G82P2002 female at [redacted]w[redacted]d with an Estimated Date of Delivery: 03/09/18 being seen today for ongoing management of a high-risk pregnancy complicated by class  A1 DM.  Today she reports no complaints. Contractions: Not present. Vag. Bleeding: None.  Movement: Present. denies leaking of fluid.  Review of Systems:   Pertinent items are noted in HPI Denies abnormal vaginal discharge w/ itching/odor/irritation, headaches, visual changes, shortness of breath, chest pain, abdominal pain, severe nausea/vomiting, or problems with urination or bowel movements unless otherwise stated above. Pertinent History Reviewed:  Reviewed past medical,surgical, social, obstetrical and family history.  Reviewed problem list, medications and allergies. Physical Assessment:   Vitals:   02/27/18 1142  BP: 118/72  Pulse: (!) 106  Weight: 209 lb (94.8 kg)  Body mass index is 32.54 kg/m.           Physical Examination:   General appearance: alert, well appearing, and in no distress  Mental status: alert, oriented to person, place, and time  Skin: warm & dry   Extremities: Edema: None    Cardiovascular: normal heart rate noted  Respiratory: normal respiratory effort, no distress  Abdomen: gravid, soft, non-tender  Pelvic: Cervical exam deferred         Fetal Status:     Movement: Present    Fetal Surveillance Testing toda   Results for orders placed or performed in visit on 02/27/18 (from the past 24 hour(s))  POC Urinalysis Dipstick OB   Collection Time: 02/27/18 11:43 AM  Result Value Ref Range   Color, UA     Clarity, UA     Glucose, UA Negative Negative   Bilirubin, UA     Ketones, UA neg    Spec Grav, UA     Blood, UA trace    pH, UA     POC,PROTEIN,UA Negative Negative, Trace, Small (1+), Moderate  (2+), Large (3+), 4+   Urobilinogen, UA     Nitrite, UA neg    Leukocytes, UA Moderate (2+) (A) Negative   Appearance     Odor      Assessment & Plan:  1) High-risk pregnancy G3P2002 at 107w4d with an Estimated Date of Delivery: 03/09/18   2) Class A1 DM, stable, pt did not bring log but states her fastings are <95 and 2 hours are <120 for the most part    Meds: No orders of the defined types were placed in this encounter.   Labs/procedures today:   Treatment Plan:  Discuss IOL 40 weeks at next visit  Reviewed: Term labor symptoms and general obstetric precautions including but not limited to vaginal bleeding, contractions, leaking of fluid and fetal movement were reviewed in detail with the patient.  All questions were answered.  Follow-up: No follow-ups on file.  Orders Placed This Encounter  Procedures  . POC Urinalysis Dipstick OB   Florian Buff  02/27/2018 11:53 AM

## 2018-03-06 ENCOUNTER — Inpatient Hospital Stay (HOSPITAL_COMMUNITY)
Admission: AD | Admit: 2018-03-06 | Discharge: 2018-03-08 | DRG: 798 | Disposition: A | Discharge status: Home / Self Care | Payer: Medicaid Other | Attending: Family Medicine | Admitting: Family Medicine

## 2018-03-06 ENCOUNTER — Telehealth: Payer: Self-pay | Admitting: *Deleted

## 2018-03-06 ENCOUNTER — Other Ambulatory Visit: Payer: Self-pay

## 2018-03-06 ENCOUNTER — Encounter (HOSPITAL_COMMUNITY): Payer: Self-pay | Admitting: *Deleted

## 2018-03-06 ENCOUNTER — Encounter: Payer: Medicaid Other | Admitting: Women's Health

## 2018-03-06 DIAGNOSIS — F418 Other specified anxiety disorders: Secondary | ICD-10-CM

## 2018-03-06 DIAGNOSIS — Z3483 Encounter for supervision of other normal pregnancy, third trimester: Secondary | ICD-10-CM | POA: Diagnosis present

## 2018-03-06 DIAGNOSIS — Z302 Encounter for sterilization: Secondary | ICD-10-CM | POA: Diagnosis not present

## 2018-03-06 DIAGNOSIS — Z87891 Personal history of nicotine dependence: Secondary | ICD-10-CM | POA: Diagnosis not present

## 2018-03-06 DIAGNOSIS — O99344 Other mental disorders complicating childbirth: Secondary | ICD-10-CM | POA: Diagnosis present

## 2018-03-06 DIAGNOSIS — F419 Anxiety disorder, unspecified: Secondary | ICD-10-CM | POA: Diagnosis present

## 2018-03-06 DIAGNOSIS — O9902 Anemia complicating childbirth: Secondary | ICD-10-CM | POA: Diagnosis present

## 2018-03-06 DIAGNOSIS — D649 Anemia, unspecified: Secondary | ICD-10-CM | POA: Diagnosis present

## 2018-03-06 DIAGNOSIS — Z3A39 39 weeks gestation of pregnancy: Secondary | ICD-10-CM

## 2018-03-06 DIAGNOSIS — O24420 Gestational diabetes mellitus in childbirth, diet controlled: Secondary | ICD-10-CM

## 2018-03-06 DIAGNOSIS — O99824 Streptococcus B carrier state complicating childbirth: Secondary | ICD-10-CM | POA: Diagnosis present

## 2018-03-06 DIAGNOSIS — F329 Major depressive disorder, single episode, unspecified: Secondary | ICD-10-CM | POA: Diagnosis present

## 2018-03-06 DIAGNOSIS — O99343 Other mental disorders complicating pregnancy, third trimester: Secondary | ICD-10-CM

## 2018-03-06 DIAGNOSIS — O99820 Streptococcus B carrier state complicating pregnancy: Secondary | ICD-10-CM

## 2018-03-06 DIAGNOSIS — O99013 Anemia complicating pregnancy, third trimester: Secondary | ICD-10-CM

## 2018-03-06 LAB — TYPE AND SCREEN
ABO/RH(D): O POS
Antibody Screen: NEGATIVE

## 2018-03-06 LAB — GLUCOSE, CAPILLARY: Glucose-Capillary: 86 mg/dL (ref 70–99)

## 2018-03-06 LAB — CBC
HCT: 32.4 % — ABNORMAL LOW (ref 36.0–46.0)
Hemoglobin: 10.1 g/dL — ABNORMAL LOW (ref 12.0–15.0)
MCH: 24.6 pg — ABNORMAL LOW (ref 26.0–34.0)
MCHC: 31.2 g/dL (ref 30.0–36.0)
MCV: 78.8 fL — ABNORMAL LOW (ref 80.0–100.0)
Platelets: 261 10*3/uL (ref 150–400)
RBC: 4.11 MIL/uL (ref 3.87–5.11)
RDW: 14.7 % (ref 11.5–15.5)
WBC: 14 10*3/uL — ABNORMAL HIGH (ref 4.0–10.5)
nRBC: 0 % (ref 0.0–0.2)

## 2018-03-06 LAB — RPR: RPR Ser Ql: NONREACTIVE

## 2018-03-06 MED ORDER — MEASLES, MUMPS & RUBELLA VAC IJ SOLR
0.5000 mL | Freq: Once | INTRAMUSCULAR | Status: DC
  Filled 2018-03-06: qty 0.5

## 2018-03-06 MED ORDER — LACTATED RINGERS IV SOLN
INTRAVENOUS | Status: DC
  Administered 2018-03-06: 06:00:00 via INTRAVENOUS

## 2018-03-06 MED ORDER — IBUPROFEN 600 MG PO TABS
600.0000 mg | ORAL_TABLET | Freq: Four times a day (QID) | ORAL | Status: DC
  Administered 2018-03-06 – 2018-03-08 (×7): 600 mg via ORAL
  Filled 2018-03-06 (×7): qty 1

## 2018-03-06 MED ORDER — FENTANYL CITRATE (PF) 100 MCG/2ML IJ SOLN
100.0000 ug | INTRAMUSCULAR | Status: DC | PRN
  Administered 2018-03-06 (×3): 100 ug via INTRAVENOUS
  Filled 2018-03-06 (×3): qty 2

## 2018-03-06 MED ORDER — SIMETHICONE 80 MG PO CHEW: 80.0000 mg | CHEWABLE_TABLET | ORAL | Status: DC | PRN

## 2018-03-06 MED ORDER — MAGNESIUM HYDROXIDE 400 MG/5ML PO SUSP: 30.0000 mL | ORAL | Status: DC | PRN

## 2018-03-06 MED ORDER — ACETAMINOPHEN 325 MG PO TABS
650.0000 mg | ORAL_TABLET | ORAL | Status: DC | PRN
  Administered 2018-03-06: 650 mg via ORAL
  Filled 2018-03-06: qty 2

## 2018-03-06 MED ORDER — OXYCODONE-ACETAMINOPHEN 5-325 MG PO TABS
1.0000 | ORAL_TABLET | ORAL | Status: DC | PRN
  Administered 2018-03-07 (×2): 1 via ORAL
  Filled 2018-03-06 (×2): qty 1

## 2018-03-06 MED ORDER — OXYCODONE-ACETAMINOPHEN 5-325 MG PO TABS: 2.0000 | ORAL_TABLET | ORAL | Status: DC | PRN

## 2018-03-06 MED ORDER — DIPHENHYDRAMINE HCL 25 MG PO CAPS: 25.0000 mg | ORAL_CAPSULE | Freq: Four times a day (QID) | ORAL | Status: DC | PRN

## 2018-03-06 MED ORDER — SODIUM CHLORIDE 0.9 % IV SOLN
2.0000 g | Freq: Once | INTRAVENOUS | Status: AC
  Administered 2018-03-06: 2 g via INTRAVENOUS
  Filled 2018-03-06: qty 2000

## 2018-03-06 MED ORDER — FAMOTIDINE 20 MG PO TABS
40.0000 mg | ORAL_TABLET | Freq: Once | ORAL | Status: AC
  Administered 2018-03-07: 40 mg via ORAL
  Filled 2018-03-06: qty 2

## 2018-03-06 MED ORDER — LACTATED RINGERS IV SOLN
INTRAVENOUS | Status: DC
  Administered 2018-03-07 (×2): via INTRAVENOUS

## 2018-03-06 MED ORDER — LACTATED RINGERS IV SOLN: 500.0000 mL | Freq: Once | INTRAVENOUS | Status: DC

## 2018-03-06 MED ORDER — COCONUT OIL OIL: 1.0000 "application " | TOPICAL_OIL | Status: DC | PRN

## 2018-03-06 MED ORDER — LACTATED RINGERS IV SOLN: 500.0000 mL | INTRAVENOUS | Status: DC | PRN

## 2018-03-06 MED ORDER — PRENATAL MULTIVITAMIN CH
1.0000 | ORAL_TABLET | Freq: Every day | ORAL | Status: DC
  Administered 2018-03-07 – 2018-03-08 (×2): 1 via ORAL
  Filled 2018-03-06 (×2): qty 1

## 2018-03-06 MED ORDER — PHENYLEPHRINE 40 MCG/ML (10ML) SYRINGE FOR IV PUSH (FOR BLOOD PRESSURE SUPPORT)
80.0000 ug | PREFILLED_SYRINGE | INTRAVENOUS | Status: DC | PRN
  Filled 2018-03-06: qty 10

## 2018-03-06 MED ORDER — ONDANSETRON HCL 4 MG/2ML IJ SOLN: 4.0000 mg | INTRAMUSCULAR | Status: DC | PRN

## 2018-03-06 MED ORDER — ONDANSETRON HCL 4 MG/2ML IJ SOLN
4.0000 mg | Freq: Four times a day (QID) | INTRAMUSCULAR | Status: DC | PRN
  Administered 2018-03-06: 4 mg via INTRAVENOUS
  Filled 2018-03-06: qty 2

## 2018-03-06 MED ORDER — IBUPROFEN 600 MG PO TABS
600.0000 mg | ORAL_TABLET | Freq: Once | ORAL | Status: AC
  Administered 2018-03-06: 600 mg via ORAL
  Filled 2018-03-06: qty 1

## 2018-03-06 MED ORDER — WITCH HAZEL-GLYCERIN EX PADS: 1.0000 "application " | MEDICATED_PAD | CUTANEOUS | Status: DC | PRN

## 2018-03-06 MED ORDER — LIDOCAINE HCL (PF) 1 % IJ SOLN
30.0000 mL | INTRAMUSCULAR | Status: DC | PRN
  Filled 2018-03-06: qty 30

## 2018-03-06 MED ORDER — ACETAMINOPHEN 325 MG PO TABS: 650.0000 mg | ORAL_TABLET | ORAL | Status: DC | PRN

## 2018-03-06 MED ORDER — OXYTOCIN 40 UNITS IN LACTATED RINGERS INFUSION - SIMPLE MED
1.0000 m[IU]/min | INTRAVENOUS | Status: DC
  Administered 2018-03-06: 2 m[IU]/min via INTRAVENOUS

## 2018-03-06 MED ORDER — EPHEDRINE 5 MG/ML INJ
10.0000 mg | INTRAVENOUS | Status: DC | PRN
  Filled 2018-03-06: qty 2

## 2018-03-06 MED ORDER — FENTANYL CITRATE (PF) 100 MCG/2ML IJ SOLN
100.0000 ug | Freq: Once | INTRAMUSCULAR | Status: AC
  Administered 2018-03-06: 100 ug via INTRAVENOUS
  Filled 2018-03-06: qty 2

## 2018-03-06 MED ORDER — DIPHENHYDRAMINE HCL 50 MG/ML IJ SOLN: 12.5000 mg | INTRAMUSCULAR | Status: DC | PRN

## 2018-03-06 MED ORDER — TETANUS-DIPHTH-ACELL PERTUSSIS 5-2.5-18.5 LF-MCG/0.5 IM SUSP: 0.5000 mL | Freq: Once | INTRAMUSCULAR | Status: DC

## 2018-03-06 MED ORDER — OXYTOCIN BOLUS FROM INFUSION
500.0000 mL | Freq: Once | INTRAVENOUS | Status: AC
  Administered 2018-03-06: 500 mL via INTRAVENOUS

## 2018-03-06 MED ORDER — SOD CITRATE-CITRIC ACID 500-334 MG/5ML PO SOLN: 30.0000 mL | ORAL | Status: DC | PRN

## 2018-03-06 MED ORDER — OXYTOCIN 40 UNITS IN LACTATED RINGERS INFUSION - SIMPLE MED
2.5000 [IU]/h | INTRAVENOUS | Status: DC
  Filled 2018-03-06: qty 1000

## 2018-03-06 MED ORDER — SODIUM CHLORIDE 0.9 % IV SOLN: 5.0000 10*6.[IU] | Freq: Once | INTRAVENOUS | Status: DC

## 2018-03-06 MED ORDER — BENZOCAINE-MENTHOL 20-0.5 % EX AERO
1.0000 "application " | INHALATION_SPRAY | CUTANEOUS | Status: DC | PRN
  Filled 2018-03-06: qty 56

## 2018-03-06 MED ORDER — FENTANYL 2.5 MCG/ML BUPIVACAINE 1/10 % EPIDURAL INFUSION (WH - ANES): 14.0000 mL/h | INTRAMUSCULAR | Status: DC | PRN

## 2018-03-06 MED ORDER — TERBUTALINE SULFATE 1 MG/ML IJ SOLN
0.2500 mg | Freq: Once | INTRAMUSCULAR | Status: DC | PRN
  Filled 2018-03-06: qty 1

## 2018-03-06 MED ORDER — METOCLOPRAMIDE HCL 10 MG PO TABS
10.0000 mg | ORAL_TABLET | Freq: Once | ORAL | Status: AC
  Administered 2018-03-07: 10 mg via ORAL
  Filled 2018-03-06: qty 1

## 2018-03-06 MED ORDER — OXYCODONE-ACETAMINOPHEN 5-325 MG PO TABS: 1.0000 | ORAL_TABLET | ORAL | Status: DC | PRN

## 2018-03-06 MED ORDER — DIBUCAINE 1 % RE OINT: 1.0000 "application " | TOPICAL_OINTMENT | RECTAL | Status: DC | PRN

## 2018-03-06 MED ORDER — PENICILLIN G 3 MILLION UNITS IVPB - SIMPLE MED: 3.0000 10*6.[IU] | INTRAVENOUS | Status: DC

## 2018-03-06 MED ORDER — FERROUS SULFATE 325 (65 FE) MG PO TABS
325.0000 mg | ORAL_TABLET | Freq: Two times a day (BID) | ORAL | Status: DC
  Administered 2018-03-07 – 2018-03-08 (×3): 325 mg via ORAL
  Filled 2018-03-06 (×4): qty 1

## 2018-03-06 MED ORDER — ONDANSETRON HCL 4 MG PO TABS
4.0000 mg | ORAL_TABLET | ORAL | Status: DC | PRN
  Administered 2018-03-07: 4 mg via ORAL
  Filled 2018-03-06: qty 1

## 2018-03-06 MED ORDER — SODIUM CHLORIDE 0.9 % IV SOLN
2.0000 g | Freq: Four times a day (QID) | INTRAVENOUS | Status: DC
  Filled 2018-03-06 (×3): qty 2000

## 2018-03-06 NOTE — Lactation Note (Signed)
This note was copied from a baby's chart. Lactation Consultation Note  Patient Name: Kimberly Olsen ONGEX'B Date: 03/06/2018 Reason for consult: Initial assessment;Term;Maternal endocrine disorder Type of Endocrine Disorder?: Diabetes  30 hours old FT female who is being mostly formula fed by her mother, she's a P3. Mom has some experience BF, she BF her other children but only for 2-3 weeks, and the main BF difficulty she faced were sore/painful nipples due to engorgement. She participated in the [facility] program at [facility] and doesn't have a pump at home, [facility] offered a hand pump from the hospital. Pump instructions, cleaning and storage were reviewed as well as milk storage guidelines. She's already familiar with hand expression and has been able to see colostrum but mom has only put baby to the breast once today.  Offered assistance with latch but mom politely declined, she was giving baby a bottle of Gerber Gentle during [facility] consultation; mom already had a six pack of formula in the room. Baby was very spitty, mom kept trying to feed baby a bottle and using the bulb syring at the same time due to the spit up. Explained to mom normal newborn behavior the first 24 hours, revised feeding cues and asked her to call for assistance if she'd like to put baby to the breast. Baby had a dirty diaper and a wet, documented in [place].   Feeding plan:  1. Encouraged mom to feed baby STS 8-12 times/24 hours or sooner if feeding cues are present, but unsure if mom is committed to BF at this point. 2. Hand expression and spoon feeding was also encouraged  BF brochure, BF resources and feeding diary were reviewed. Mom reported all questions and concerns were answered, she's aware of [place] services and will call PRN.  Maternal Data Formula Feeding for Exclusion: Yes Reason for exclusion: Mother's choice to formula and breast feed on admission Has patient been taught Hand Expression?: Yes Does the  patient have breastfeeding experience prior to this delivery?: Yes  Feeding   Interventions Interventions: Breast feeding basics reviewed;Hand pump  Lactation Tools Discussed/Used Tools: Pump Breast pump type: Manual WIC Program: Yes Pump Review: Setup, frequency, and cleaning;Milk Storage Initiated by:: MPeck Date initiated:: 03/06/18   Consult Status Consult Status: PRN Date: 03/07/18 Follow-up type: In-patient    Kimberly Olsen Kimberly Olsen 03/06/2018, 8:51 PM

## 2018-03-06 NOTE — Progress Notes (Signed)
Kimberly Olsen is a 27 y.o. G3P2002 at [redacted]w[redacted]d admitted for active labor. Prenatal care at [facility].  Subjective: Sleeping through contractions with IV analgesia.   Objective: BP 130/73   Pulse 75   Temp 98.6 F (37 C) (Oral)   Resp 16   Ht 5\' 7"  (1.702 m)   LMP 06/02/2017   BMI 32.73 kg/m  No intake/output data recorded. No intake/output data recorded.  FHT:  FHR: 110 bpm, variability: moderate,  accelerations:  Present,  decelerations:  Absent UC:   irregular, every 2-4 minutes SVE:   Dilation: 9 Effacement (%): 100 Station: 0 Exam by:: lee  Labs: Lab Results  Component Value Date   WBC 14.0 (H) 03/06/2018   HGB 10.1 (L) 03/06/2018   HCT 32.4 (L) 03/06/2018   MCV 78.8 (L) 03/06/2018   PLT 261 03/06/2018    Assessment / Plan: Spontaneous labor, progressing normally  Labor: Pit started around 10am Fetal Wellbeing:  Category I Pain Control:  IV pain meds Anticipated MOD:  NSVD  Darlina Rumpf, CNM 03/06/2018, 10:20 AM

## 2018-03-06 NOTE — Progress Notes (Signed)
CSW acknowledged consult and will meet with patient after L&D.  Laurey Arrow, MSW, LCSW Clinical Social Work [phone]

## 2018-03-06 NOTE — Anesthesia Pain Management Evaluation Note (Signed)
  CRNA Pain Management Visit Note  Patient: Kimberly Olsen, 27 y.o., female  "Hello I am a member of the anesthesia team at [facility]. We have an anesthesia team available at all times to provide care throughout the hospital, including epidural management and anesthesia for C-section. I don't know your plan for the delivery whether it a natural birth, water birth, IV sedation, nitrous supplementation, doula or epidural, but we want to meet your pain goals."   1.Was your pain managed to your expectations on prior hospitalizations?   Yes   2.What is your expectation for pain management during this hospitalization?     Labor support without medications  3.How can we help you reach that goal? Support pt in decisions;declines information regarding epidurals for now  Record the patient's initial score and the patient's pain goal.   Pain: 7  Pain Goal: 9 [facility] wants you to be able to say your pain was always managed very well.  Everette Rank 03/06/2018

## 2018-03-06 NOTE — Telephone Encounter (Signed)
Lmom for pt to call us back to schedule pp appointment.  03-06-18  AS

## 2018-03-06 NOTE — H&P (Addendum)
LABOR AND DELIVERY ADMISSION HISTORY AND PHYSICAL NOTE  Kimberly Olsen is a 27 y.o. female G3P2002 with IUP at 21w4dpresenting for SOL.   She reports positive fetal movement. She reports mild pink fluid discharge for the past few days. Feeling contractions.   [facility]:  - AL3YB no medications.  - Hx of depression/Anxiety - Positive cannabinoids 08/08/17 - GBS positive - Anemia, restless legs  Past Medical History: Past Medical History:  Diagnosis Date  . Anemia   . Anxiety   . Depression     Past Surgical History: Past Surgical History:  Procedure Laterality Date  . OOPHORECTOMY      Obstetrical History: OB History    Gravida  3   Para  2   Term  2   Preterm      AB      Living  2     SAB      TAB      Ectopic      Multiple  0   Live Births  2           Social History: Social History   Socioeconomic History  . Marital status: Married    Spouse name: Not on file  . Number of children: Not on file  . Years of education: Not on file  . Highest education level: Not on file  Occupational History  . Not on file  Social Needs  . Financial resource strain: Not on file  . Food insecurity:    Worry: Not on file    Inability: Not on file  . Transportation needs:    Medical: Not on file    Non-medical: Not on file  Tobacco Use  . Smoking status: Former SResearch scientist (life sciences) . Smokeless tobacco: Never Used  Substance and Sexual Activity  . Alcohol use: No  . Drug use: Yes    Types: Marijuana    Comment: last used 08/05/2017  . Sexual activity: Yes    Birth control/protection: None  Lifestyle  . Physical activity:    Days per week: Not on file    Minutes per session: Not on file  . Stress: Not on file  Relationships  . Social connections:    Talks on phone: Not on file    Gets together: Not on file    Attends religious service: Not on file    Active member of club or organization:  Not on file    Attends meetings of clubs or organizations: Not on file    Relationship status: Not on file  Other Topics Concern  . Not on file  Social History Narrative  . Not on file    Family History: Family History  Problem Relation Age of Onset  . Depression Mother   . Asthma Mother   . Mental illness Mother   . Other Mother        drug over dose  . Depression Father   . Mental illness Father   . Anesthesia problems Neg Hx   . Hypotension Neg Hx   . Malignant hyperthermia Neg Hx   . Pseudochol deficiency Neg Hx     Allergies: Allergies  Allergen Reactions  . Coconut Fatty Acids Hives  . Sulfa Antibiotics Hives and Rash    Medications Prior to Admission  Medication Sig Dispense Refill Last Dose  . ACCU-CHEK FASTCLIX LANCETS MISC 1 Device by Percutaneous route 4 (four) times daily. 100 each 12 Past Week at  Unknown time  . Blood Glucose Monitoring Suppl (ACCU-CHEK AVIVA) device Use as instructed 1 each 0 Past Week at Unknown time  . Doxylamine-Pyridoxine (DICLEGIS) 10-10 MG TBEC 2 tabs q hs, if sx persist add 1 tab q am on day 3, if sx persist add 1 tab q afternoon on day 4 100 tablet 6 03/05/2018 at Unknown time  . glucose blood test strip Use as instructed 100 each 12 Past Week at Unknown time  . pantoprazole (PROTONIX) 20 MG tablet Take 1 tablet (20 mg total) by mouth daily. 30 tablet 3 03/05/2018 at Unknown time  . prenatal vitamin w/FE, FA (PRENATAL 1 + 1) 27-1 MG TABS tablet Take 1 tablet by mouth daily at 12 noon. 30 each 12 03/05/2018 at Unknown time  . ferrous sulfate 325 (65 FE) MG tablet Take 1 tablet (325 mg total) by mouth 2 (two) times daily with a meal. 60 tablet 3      Review of Systems  All systems reviewed and negative except as stated in HPI  Physical Exam Last menstrual period 06/02/2017, unknown if currently breastfeeding. General appearance: alert, oriented, NAD Lungs: normal respiratory effort Heart: regular rate Abdomen: soft, non-tender;  gravid, FH appropriate for GA Extremities: No calf swelling or tenderness Presentation: vertex Fetal monitoring: Baseline HR 110, 15x15 Uterine activity: Mild contractions Dilation: 6 Effacement (%): 80 Station: -1 Exam by:: Hansel Feinstein CNM   Prenatal labs: ABO, Rh: O/Positive/-- (05/24 1344) Antibody: Negative (09/13 0920) Rubella: 1.99 (05/24 1344) RPR: Non Reactive (09/13 0920)  HBsAg: Negative (05/24 1344)  HIV: Non Reactive (09/13 0920)  GC/Chlamydia: Neg 02/19/18 GBS:   Positive 02/19/18 2-hr GTT: 194 Genetic screening: NT/IT neg Anatomy US: 10/17/17: EFW 88%, anatomy complete, no obvious abnormalities   Prenatal Transfer Tool  Maternal Diabetes: Yes:  Diabetes Type:  Diet controlled Genetic Screening: Normal Maternal Ultrasounds/Referrals: Normal Fetal Ultrasounds or other Referrals:  None Maternal Substance Abuse:  Yes:  Type: Marijuana Significant Maternal Medications:  Meds include: Other: Ferrous sulfate, Diclegis, protonix, prenatal vit Significant Maternal Lab Results: None  No results found for this or any previous visit (from the past 24 hour(s)).  Patient Active Problem List   Diagnosis Date Noted  . Supervision of high risk pregnancy, antepartum 08/08/2017  . Depression with anxiety 08/08/2017  . History of gestational diabetes 08/08/2017  . GDM (gestational diabetes mellitus), class A1 08/17/2014  . Marijuana use 01/19/2014    Assessment: Kimberly Olsen is a 27 y.o. G3P2002 at 91w4dhere for SOL.  #Labor: SOL #Pain: IV medication #FWB:  #ID: GBS positive, Ampicillin order placed #MOF: Breast and bottle #MOC: BTL paperwork signed 01/30/18, not sure yet if she wants it #Circ: N/A, girl  ECarilyn GoodpastureMS3 03/06/2018, 5:21 AM    I personally saw and evaluated the patient, performing the key elements of the service. I developed and verified the management plan that is described in the resident's/student's note, and I agree with the content  with my edits above. VSS, HRR&R, Resp unlabored, Legs neg. Doing well with pain. FHR Cat 1.    FNigel Berthold CNM 03/06/2018 7:17 AM

## 2018-03-06 NOTE — MAU Note (Signed)
Pt presents to MAU c/o ctx every 3-5 min since 2100 no LOF. Bloody show. +FM.

## 2018-03-07 ENCOUNTER — Encounter (HOSPITAL_COMMUNITY)
Admission: AD | Disposition: A | Discharge status: Home / Self Care | Payer: Self-pay | Source: Home / Self Care | Attending: Family Medicine

## 2018-03-07 ENCOUNTER — Encounter (HOSPITAL_COMMUNITY): Payer: Self-pay | Admitting: Obstetrics and Gynecology

## 2018-03-07 ENCOUNTER — Inpatient Hospital Stay (HOSPITAL_COMMUNITY): Payer: Medicaid Other | Admitting: Anesthesiology

## 2018-03-07 DIAGNOSIS — Z302 Encounter for sterilization: Secondary | ICD-10-CM

## 2018-03-07 HISTORY — PX: TUBAL LIGATION: SHX77

## 2018-03-07 LAB — GLUCOSE, CAPILLARY: Glucose-Capillary: 78 mg/dL (ref 70–99)

## 2018-03-07 SURGERY — LIGATION, FALLOPIAN TUBE, POSTPARTUM
Anesthesia: General | Laterality: Bilateral

## 2018-03-07 MED ORDER — GLYCOPYRROLATE 0.2 MG/ML IJ SOLN
INTRAMUSCULAR | Status: DC | PRN
  Administered 2018-03-07: 0.1 mg via INTRAVENOUS

## 2018-03-07 MED ORDER — ONDANSETRON HCL 4 MG/2ML IJ SOLN
INTRAMUSCULAR | Status: AC
  Filled 2018-03-07: qty 2

## 2018-03-07 MED ORDER — MEPERIDINE HCL 25 MG/ML IJ SOLN: 6.2500 mg | INTRAMUSCULAR | Status: DC | PRN

## 2018-03-07 MED ORDER — FENTANYL CITRATE (PF) 100 MCG/2ML IJ SOLN
INTRAMUSCULAR | Status: DC | PRN
  Administered 2018-03-07 (×2): 50 ug via INTRAVENOUS
  Administered 2018-03-07: 100 ug via INTRAVENOUS

## 2018-03-07 MED ORDER — PROPOFOL 10 MG/ML IV BOLUS
INTRAVENOUS | Status: DC | PRN
  Administered 2018-03-07: 200 mg via INTRAVENOUS

## 2018-03-07 MED ORDER — MIDAZOLAM HCL 2 MG/2ML IJ SOLN
INTRAMUSCULAR | Status: AC
  Filled 2018-03-07: qty 2

## 2018-03-07 MED ORDER — GLYCOPYRROLATE 0.2 MG/ML IJ SOLN
INTRAMUSCULAR | Status: AC
  Filled 2018-03-07: qty 3

## 2018-03-07 MED ORDER — MIDAZOLAM HCL 2 MG/2ML IJ SOLN: 0.5000 mg | Freq: Once | INTRAMUSCULAR | Status: DC | PRN

## 2018-03-07 MED ORDER — PROPOFOL 10 MG/ML IV BOLUS
INTRAVENOUS | Status: AC
  Filled 2018-03-07: qty 20

## 2018-03-07 MED ORDER — DEXAMETHASONE SODIUM PHOSPHATE 4 MG/ML IJ SOLN
INTRAMUSCULAR | Status: AC
  Filled 2018-03-07: qty 1

## 2018-03-07 MED ORDER — SCOPOLAMINE 1 MG/3DAYS TD PT72
MEDICATED_PATCH | TRANSDERMAL | Status: DC | PRN
  Administered 2018-03-07: 1 via TRANSDERMAL

## 2018-03-07 MED ORDER — MIDAZOLAM HCL 2 MG/2ML IJ SOLN
INTRAMUSCULAR | Status: DC | PRN
  Administered 2018-03-07: 1 mg via INTRAVENOUS

## 2018-03-07 MED ORDER — SUGAMMADEX SODIUM 200 MG/2ML IV SOLN
INTRAVENOUS | Status: DC | PRN
  Administered 2018-03-07: 189.6 mg via INTRAVENOUS

## 2018-03-07 MED ORDER — PROMETHAZINE HCL 25 MG/ML IJ SOLN: 6.2500 mg | INTRAMUSCULAR | Status: DC | PRN

## 2018-03-07 MED ORDER — FENTANYL CITRATE (PF) 250 MCG/5ML IJ SOLN
INTRAMUSCULAR | Status: AC
  Filled 2018-03-07: qty 5

## 2018-03-07 MED ORDER — FENTANYL CITRATE (PF) 100 MCG/2ML IJ SOLN
INTRAMUSCULAR | Status: AC
  Filled 2018-03-07: qty 2

## 2018-03-07 MED ORDER — LIDOCAINE HCL (CARDIAC) PF 100 MG/5ML IV SOSY
PREFILLED_SYRINGE | INTRAVENOUS | Status: DC | PRN
  Administered 2018-03-07: 100 mg via INTRAVENOUS

## 2018-03-07 MED ORDER — BUPIVACAINE-EPINEPHRINE (PF) 0.25% -1:200000 IJ SOLN
INTRAMUSCULAR | Status: AC
  Filled 2018-03-07: qty 30

## 2018-03-07 MED ORDER — ROCURONIUM BROMIDE 100 MG/10ML IV SOLN
INTRAVENOUS | Status: AC
  Filled 2018-03-07: qty 1

## 2018-03-07 MED ORDER — KETOROLAC TROMETHAMINE 30 MG/ML IJ SOLN
INTRAMUSCULAR | Status: DC | PRN
  Administered 2018-03-07: 30 mg via INTRAVENOUS

## 2018-03-07 MED ORDER — BUPIVACAINE HCL (PF) 0.25 % IJ SOLN
INTRAMUSCULAR | Status: DC | PRN
  Administered 2018-03-07: 20 mL

## 2018-03-07 MED ORDER — KETOROLAC TROMETHAMINE 30 MG/ML IJ SOLN
INTRAMUSCULAR | Status: AC
  Filled 2018-03-07: qty 1

## 2018-03-07 MED ORDER — DEXAMETHASONE SODIUM PHOSPHATE 10 MG/ML IJ SOLN
INTRAMUSCULAR | Status: DC | PRN
  Administered 2018-03-07: 10 mg via INTRAVENOUS

## 2018-03-07 MED ORDER — SCOPOLAMINE 1 MG/3DAYS TD PT72
MEDICATED_PATCH | TRANSDERMAL | Status: AC
  Filled 2018-03-07: qty 1

## 2018-03-07 MED ORDER — FENTANYL CITRATE (PF) 100 MCG/2ML IJ SOLN
25.0000 ug | INTRAMUSCULAR | Status: DC | PRN
  Administered 2018-03-07 (×3): 50 ug via INTRAVENOUS

## 2018-03-07 MED ORDER — LIDOCAINE HCL (CARDIAC) PF 100 MG/5ML IV SOSY
PREFILLED_SYRINGE | INTRAVENOUS | Status: AC
  Filled 2018-03-07: qty 5

## 2018-03-07 MED ORDER — ONDANSETRON HCL 4 MG/2ML IJ SOLN
INTRAMUSCULAR | Status: DC | PRN
  Administered 2018-03-07: 4 mg via INTRAVENOUS

## 2018-03-07 MED ORDER — ROCURONIUM BROMIDE 100 MG/10ML IV SOLN
INTRAVENOUS | Status: DC | PRN
  Administered 2018-03-07: 15 mg via INTRAVENOUS

## 2018-03-07 MED ORDER — SUCCINYLCHOLINE CHLORIDE 20 MG/ML IJ SOLN
INTRAMUSCULAR | Status: DC | PRN
  Administered 2018-03-07: 120 mg via INTRAVENOUS

## 2018-03-07 SURGICAL SUPPLY — 23 items
CLIP FILSHIE TUBAL LIGA STRL (Clip) ×3 IMPLANT
CLOTH BEACON ORANGE TIMEOUT ST (SAFETY) ×3 IMPLANT
DRSG OPSITE POSTOP 3X4 (GAUZE/BANDAGES/DRESSINGS) ×3 IMPLANT
DURAPREP 26ML APPLICATOR (WOUND CARE) ×3 IMPLANT
GLOVE BIO SURGEON STRL SZ7.5 (GLOVE) ×3 IMPLANT
GLOVE BIOGEL PI IND STRL 7.0 (GLOVE) ×2 IMPLANT
GLOVE BIOGEL PI INDICATOR 7.0 (GLOVE) ×4
GOWN STRL REUS W/TWL LRG LVL3 (GOWN DISPOSABLE) ×3 IMPLANT
GOWN STRL REUS W/TWL XL LVL3 (GOWN DISPOSABLE) ×3 IMPLANT
NEEDLE HYPO 22GX1.5 SAFETY (NEEDLE) ×3 IMPLANT
NS IRRIG 1000ML POUR BTL (IV SOLUTION) ×3 IMPLANT
PACK ABDOMINAL MINOR (CUSTOM PROCEDURE TRAY) ×3 IMPLANT
PROTECTOR NERVE ULNAR (MISCELLANEOUS) ×3 IMPLANT
SLEEVE SCD COMPRESS KNEE MED (MISCELLANEOUS) ×2 IMPLANT
SPONGE LAP 4X18 RFD (DISPOSABLE) IMPLANT
SUT MNCRL AB 4-0 PS2 18 (SUTURE) ×3 IMPLANT
SUT PLAIN 0 NONE (SUTURE) ×3 IMPLANT
SUT VIC AB 0 CT1 27 (SUTURE) ×3
SUT VIC AB 0 CT1 27XBRD ANBCTR (SUTURE) ×1 IMPLANT
SYR CONTROL 10ML LL (SYRINGE) ×3 IMPLANT
TOWEL OR 17X24 6PK STRL BLUE (TOWEL DISPOSABLE) ×6 IMPLANT
TRAY FOLEY CATH SILVER 14FR (SET/KITS/TRAYS/PACK) ×3 IMPLANT
WATER STERILE IRR 1000ML POUR (IV SOLUTION) ×3 IMPLANT

## 2018-03-07 NOTE — Progress Notes (Signed)
Fentanyl 50 mcg given per [facility] CRNA in PACU

## 2018-03-07 NOTE — Op Note (Signed)
Kimberly Olsen 03/06/2018 - 03/07/2018  PREOPERATIVE DIAGNOSES: Multiparity, undesired fertility  POSTOPERATIVE DIAGNOSES: Multiparity, undesired fertility  PROCEDURE:  Postpartum Bilateral Tubal Sterilization using Filshie Clips   SURGEON: Dr. Arlina Robes  Assist: Dr. Raeford Razor, OB Fellow  ANESTHESIA:  GETA and local analgesia using 20 ml of 1.1% Marcaine  COMPLICATIONS:  None immediate.  ESTIMATED BLOOD LOSS: 5 ml.  FLUIDS: As recorded  URINE OUTPUT:  As recorded  INDICATIONS:  27 y.o. G3P3003 with undesired fertility,status post vaginal delivery, desires permanent sterilization.  Other reversible forms of contraception were discussed with patient; she declines all other modalities. Risks of procedure discussed with patient including but not limited to: risk of regret, permanence of method, bleeding, infection, injury to surrounding organs and need for additional procedures.  Failure risk of 1 -2 % with increased risk of ectopic gestation if pregnancy occurs was also discussed with patient.      FINDINGS:  Normal uterus, right tube and ovary, absent left tube and ovary ( with H/O LSO)  PROCEDURE DETAILS: The patient was taken to the operating room where her epidural anesthesia was dosed up to surgical level and found to be adequate.  She was then placed in the dorsal supine position and prepped and draped in sterile fashion.  After an adequate timeout was performed, attention was turned to the patient's abdomen where a small transverse skin incision was made under the umbilical fold. The incision was taken down to the layer of fascia using the scalpel, and fascia was incised, and extended bilaterally using Mayo scissors. The peritoneum was entered in a sharp fashion. Attention was then turned to the patient's uterus, a small < 1 cm stub of left fallopian tube was noted, consistent with pt's H/O LSO.  Attention was turned to the right side,  and  The right fallopian tube was  identified and followed out to the fimbriated end.  A Filshie clip was placed on the right fallopian tube about 3 cm from the cornual attachment, with care given to incorporate the underlying mesosalpinx.   Good hemostasis was noted overall.  Local analgesia was injected into both Filshie application sites.The instruments were then removed from the patient's abdomen and the fascial incision was repaired with 0 Vicryl, and the skin was closed with a 4-0 Vicryl subcuticular stitch. The patient tolerated the procedure well.  Instrument, sponge, and needle counts were correct times two.  The patient was then taken to the recovery room awake and in stable condition.   Arlina Robes, MD, [place] Attending [place], [facility]

## 2018-03-07 NOTE — Anesthesia Postprocedure Evaluation (Signed)
Anesthesia Post Note  Patient: Kimberly Olsen  Procedure(s) Performed: POST PARTUM TUBAL LIGATION (Bilateral )     Patient location during evaluation: PACU Anesthesia Type: General Level of consciousness: awake and alert, patient cooperative and oriented Pain management: pain level controlled Vital Signs Assessment: post-procedure vital signs reviewed and stable Respiratory status: spontaneous breathing, nonlabored ventilation and respiratory function stable Cardiovascular status: blood pressure returned to baseline and stable Postop Assessment: no apparent nausea or vomiting Anesthetic complications: no    Last Vitals:  Vitals:   03/07/18 1245 03/07/18 1300  BP: 124/85 121/76  Pulse: (!) 57 (!) 54  Resp: 13 15  Temp:  36.8 C  SpO2: 94% 95%    Last Pain:  Vitals:   03/07/18 1300  TempSrc: Oral  PainSc: 3    Pain Goal: Patients Stated Pain Goal: 0 (03/06/18 0504)               Seleta Rhymes. Kinza Gouveia

## 2018-03-07 NOTE — Transfer of Care (Signed)
Immediate Anesthesia Transfer of Care Note  Patient: Kimberly Olsen  Procedure(s) Performed: POST PARTUM TUBAL LIGATION (Bilateral )  Patient Location: PACU  Anesthesia Type:General  Level of Consciousness: awake, alert  and oriented  Airway & Oxygen Therapy: Patient Spontanous Breathing and Patient connected to nasal cannula oxygen  Post-op Assessment: Report given to RN, Post -op Vital signs reviewed and stable and Patient moving all extremities  Post vital signs: Reviewed and stable  Last Vitals:  Vitals Value Taken Time  BP 121/74 03/07/2018 11:33 AM  Temp 36.9 C 03/07/2018 11:33 AM  Pulse 74 03/07/2018 11:33 AM  Resp 6 03/07/2018 11:33 AM  SpO2 97 % 03/07/2018 11:33 AM  Vitals shown include unvalidated device data.  Last Pain:  Vitals:   03/07/18 1133  TempSrc: Oral  PainSc:       Patients Stated Pain Goal: 0 (29/92/42 6834)  Complications: No apparent anesthesia complications

## 2018-03-07 NOTE — Progress Notes (Signed)
MOB was referred for history of depression/anxiety. * Referral screened out by Clinical Social Worker because none of the following criteria appear to apply: ~ History of anxiety/depression during this pregnancy, or of post-partum depression following prior delivery. ~ Diagnosis of anxiety and/or depression within last 3 years OR * MOB's symptoms currently being treated with medication and/or therapy. MOB has an active Rx for Zoloft and resources were provided to MOB prenatally for outpatient counseling.    CSW received consult for hx of marijuana use.  Referral was screened out due to the following: ~MOB had no documented substance use after initial prenatal visit/+UPT. ~MOB had no positive drug screens after initial prenatal visit/+UPT. ~Baby's UDS is negative.  CSW will monitor CDS results and make report to Child Protective Services if warranted.  Please contact the Clinical Social Worker if needs arise, by [facility] request, or if MOB scores greater than 9/yes to question 10 on Edinburgh Postpartum Depression Screen.  Laurey Arrow, MSW, LCSW Clinical Social Work [phone]

## 2018-03-07 NOTE — Plan of Care (Signed)
Pt progressing as expected

## 2018-03-07 NOTE — Anesthesia Procedure Notes (Signed)
Procedure Name: Intubation Date/Time: 03/07/2018 10:21 AM Performed by: Hewitt Blade, CRNA Pre-anesthesia Checklist: Patient identified, Emergency Drugs available, Suction available and Patient being monitored Patient Re-evaluated:Patient Re-evaluated prior to induction Oxygen Delivery Method: Circle system utilized Preoxygenation: Pre-oxygenation with 100% oxygen Induction Type: IV induction, Rapid sequence and Cricoid Pressure applied Laryngoscope Size: Glidescope Grade View: Grade I Tube type: Oral Tube size: 7.0 mm Number of attempts: 1 Airway Equipment and Method: Rigid stylet Placement Confirmation: ETT inserted through vocal cords under direct vision,  positive ETCO2 and breath sounds checked- equal and bilateral Secured at: 21 cm Tube secured with: Tape Dental Injury: Teeth and Oropharynx as per pre-operative assessment

## 2018-03-07 NOTE — Progress Notes (Signed)
Post Partum Day 1 Subjective: Pt without complaints this morning. Ambulating and voiding without problems. NPO since midnight.  Pain controlled.  Objective: Blood pressure 114/78, pulse 70, temperature 97.7 F (36.5 C), temperature source Oral, resp. rate 16, height 5\' 7"  (1.702 m), weight 94.8 kg, last menstrual period 06/02/2017, SpO2 100 %, unknown if currently breastfeeding.  Physical Exam:  General: alert Lochia: appropriate Uterine Fundus: firm Incision: NA DVT Evaluation: No evidence of DVT seen on physical exam.  Recent Labs    03/06/18 0547  HGB 10.1*  HCT 32.4*    Assessment/Plan: Stable Desire for PP BTL BTL R/B/Post op care and failure rate reviewed with pt. Pt with H/O left oophorectomy, not sure if tube was removed. Unable to locate OP note. Pt has verbalized understanding and desires to proceed.    LOS: 1 day   Chancy Milroy 03/07/2018, 9:52 AM

## 2018-03-07 NOTE — Anesthesia Preprocedure Evaluation (Addendum)
Anesthesia Evaluation  Patient identified by MRN, date of birth, ID band Patient awake    Reviewed: Allergy & Precautions, NPO status , Patient's Chart, lab work & pertinent test results  History of Anesthesia Complications Negative for: history of anesthetic complications  Airway Mallampati: III  TM Distance: >3 FB Neck ROM: Full    Dental  (+) Dental Advisory Given   Pulmonary former smoker,    breath sounds clear to auscultation       Cardiovascular negative cardio ROS   Rhythm:Regular Rate:Normal     Neuro/Psych Anxiety Depression negative neurological ROS     GI/Hepatic Neg liver ROS, GERD  Controlled,(+)     substance abuse  marijuana use,   Endo/Other  diabetes (glu 78), Gestational  Renal/GU negative Renal ROS     Musculoskeletal   Abdominal (+) + obese,   Peds  Hematology plt 261k   Anesthesia Other Findings   Reproductive/Obstetrics Post partum, nursing                            Anesthesia Physical Anesthesia Plan  ASA: II  Anesthesia Plan: General   Post-op Pain Management:    Induction: Intravenous  PONV Risk Score and Plan: 4 or greater and Scopolamine patch - Pre-op, Ondansetron and Dexamethasone  Airway Management Planned: Oral ETT and Video Laryngoscope Planned  Additional Equipment:   Intra-op Plan:   Post-operative Plan: Extubation in OR  Informed Consent: I have reviewed the patients History and Physical, chart, labs and discussed the procedure including the risks, benefits and alternatives for the proposed anesthesia with the patient or authorized representative who has indicated his/her understanding and acceptance.   Dental advisory given  Plan Discussed with: CRNA and Surgeon  Anesthesia Plan Comments: (Pt declines SAB, plan routine monitors, GETA)        Anesthesia Quick Evaluation

## 2018-03-08 MED ORDER — SIMETHICONE 80 MG PO CHEW: 80.0000 mg | CHEWABLE_TABLET | ORAL | 0 refills | Status: DC | PRN

## 2018-03-08 MED ORDER — IBUPROFEN 600 MG PO TABS: 600.0000 mg | ORAL_TABLET | Freq: Four times a day (QID) | ORAL | 0 refills | Status: DC

## 2018-03-08 MED ORDER — ACETAMINOPHEN 325 MG PO TABS: 650.0000 mg | ORAL_TABLET | ORAL | 0 refills | Status: DC | PRN

## 2018-03-08 MED ORDER — OXYCODONE-ACETAMINOPHEN 5-325 MG PO TABS: 1.0000 | ORAL_TABLET | ORAL | 0 refills | Status: AC | PRN

## 2018-03-08 MED ORDER — MEASLES, MUMPS & RUBELLA VAC IJ SOLR: 0.5000 mL | Freq: Once | INTRAMUSCULAR | 0 refills | Status: AC

## 2018-03-08 MED ORDER — TETANUS-DIPHTH-ACELL PERTUSSIS 5-2.5-18.5 LF-MCG/0.5 IM SUSP: 0.5000 mL | Freq: Once | INTRAMUSCULAR | 0 refills | Status: AC

## 2018-03-08 NOTE — Anesthesia Postprocedure Evaluation (Signed)
Anesthesia Post Note  Patient: Kimberly Olsen  Procedure(s) Performed: POST PARTUM TUBAL LIGATION (Bilateral )     Patient location during evaluation: Mother Baby Anesthesia Type: General Level of consciousness: awake and alert Pain management: pain level controlled Vital Signs Assessment: post-procedure vital signs reviewed and stable Respiratory status: spontaneous breathing, nonlabored ventilation and respiratory function stable Cardiovascular status: stable Postop Assessment: no apparent nausea or vomiting, able to ambulate and adequate PO intake Anesthetic complications: no    Last Vitals:  Vitals:   03/07/18 2355 03/08/18 0558  BP: 115/66 123/79  Pulse: (!) 55 (!) 59  Resp: 18 18  Temp: 36.6 C 36.4 C  SpO2:      Last Pain:  Vitals:   03/08/18 0559  TempSrc:   PainSc: 0-No pain   Pain Goal: Patients Stated Pain Goal: 0 (03/06/18 0504)               Jabier Mutton

## 2018-03-08 NOTE — Addendum Note (Signed)
Addendum  created 03/08/18 0929 by Hewitt Blade, CRNA   Clinical Note Signed

## 2018-03-08 NOTE — Discharge Summary (Addendum)
  Postpartum Discharge Summary     Patient Name: Kimberly Olsen DOB: 02/22/1991 MRN: [id number]  Date of admission: 03/06/2018 Delivering Provider: WEINHOLD, SAMANTHA C   Date of discharge: 03/08/2018  Admitting diagnosis: 39WKS CTX Intrauterine pregnancy: [redacted]w[redacted]d     Secondary diagnosis:  Active Problems:   Normal labor  Additional problems: A1DM, depression and anxiety  Discharge diagnosis: Term Pregnancy Delivered and status post postpartum bilateral tubal ligation                                                               Post partum procedures:postpartum tubal ligation Augmentation: Pitocin Complications: None  Hospital course:  Onset of Labor With Vaginal Delivery     27 y.o. yo G3P3003 at [redacted]w[redacted]d was admitted in Active Labor on 03/06/2018. Patient had an uncomplicated labor course as follows:  Membrane Rupture Time/Date: 7:00 AM ,03/06/2018   Intrapartum Procedures: Episiotomy: None [1]                                         Lacerations:  None [1]  Patient had a delivery of a Viable infant. 03/06/2018  Information for the patient's newborn:  [place], Girl Nely [[id number]]  Delivery Method: Vag-Spont Had postpartum tubal ligation on 03/07/18.   Pateint had an uncomplicated postpartum course.  She is ambulating, tolerating a regular diet, passing flatus, and urinating well. Still having some cramping and incisional pain. Patient is discharged home in stable condition on 03/08/18.  Magnesium Sulfate recieved: No BMZ received: No  Physical exam  Vitals:   03/07/18 1500 03/07/18 1942 03/07/18 2355 03/08/18 0558  BP: 121/72 122/71 115/66 123/79  Pulse: 61 (!) 54 (!) 55 (!) 59  Resp: 16 16 18 18  Temp: 98.5 F (36.9 C) 98.6 F (37 C) 97.8 F (36.6 C) 97.6 F (36.4 C)  TempSrc: Axillary Axillary Axillary Axillary  SpO2: 97% 96%    Weight:      Height:       General: alert, NAD, up walking around room  Lochia: appropriate Uterine Fundus: firm Incision: Healing  well with no significant drainage, dressing C/D/I DVT Evaluation: No evidence of DVT seen on physical exam. Labs: Lab Results  Component Value Date   WBC 14.0 (H) 03/06/2018   HGB 10.1 (L) 03/06/2018   HCT 32.4 (L) 03/06/2018   MCV 78.8 (L) 03/06/2018   PLT 261 03/06/2018   CMP Latest Ref Rng & Units 07/12/2009  Glucose 70 - 99 mg/dL -  BUN 6 - 23 mg/dL -  Creatinine 0.4 - 1.2 mg/dL -  Sodium 135 - 145 mEq/L -  Potassium 3.5 - 5.1 mEq/L -  Chloride 96 - 112 mEq/L -  CO2 19 - 32 mEq/L -  Calcium 8.4 - 10.5 mg/dL -  Total Protein 6.0 - 8.3 g/dL 7.3  Total Bilirubin 0.3 - 1.2 mg/dL 0.5  Alkaline Phos 39 - 117 U/L 53  AST 0 - 37 U/L 15  ALT 0 - 35 U/L 10    Discharge instruction: per After Visit Summary and "Baby and Me Booklet".  After visit meds:  Allergies as of 03/08/2018      Reactions   Coconut Fatty   Acids Hives   Sulfa Antibiotics Hives, Rash      Medication List    STOP taking these medications   Doxylamine-Pyridoxine 10-10 MG Tbec Commonly known as:  DICLEGIS   pantoprazole 20 MG tablet Commonly known as:  PROTONIX     TAKE these medications   ACCU-CHEK AVIVA device Use as instructed   ACCU-CHEK FASTCLIX LANCETS Misc 1 Device by Percutaneous route 4 (four) times daily.   acetaminophen 325 MG tablet Commonly known as:  TYLENOL Take 2 tablets (650 mg total) by mouth every 4 (four) hours as needed (for pain scale < 4). What changed:    medication strength  how much to take  when to take this  reasons to take this   ferrous sulfate 325 (65 FE) MG tablet Take 1 tablet (325 mg total) by mouth 2 (two) times daily with a meal.   glucose blood test strip Use as instructed   ibuprofen 600 MG tablet Commonly known as:  ADVIL,MOTRIN Take 1 tablet (600 mg total) by mouth every 6 (six) hours.   measles, mumps & rubella vaccine injection Commonly known as:  MMR Inject 0.5 mLs into the skin once for 1 dose.   oxyCODONE-acetaminophen 5-325 MG  tablet Commonly known as:  PERCOCET/ROXICET Take 1 tablet by mouth every 4 (four) hours as needed for up to 7 days (pain scale 4-7).   prenatal vitamin w/FE, FA 27-1 MG Tabs tablet Take 1 tablet by mouth daily at 12 noon.   simethicone 80 MG chewable tablet Commonly known as:  MYLICON Chew 1 tablet (80 mg total) by mouth as needed for flatulence.   Tdap 5-2.5-18.5 LF-MCG/0.5 injection Commonly known as:  BOOSTRIX Inject 0.5 mLs into the muscle once for 1 dose.            Discharge Care Instructions  (From admission, onward)         Start     Ordered   03/08/18 0000  Discharge wound care:    Comments:  Keep incision clean and dry, monitor for signs of infection   03/08/18 0911         Diet: routine diet Activity: Advance as tolerated. Pelvic rest for 6 weeks.   Outpatient follow up:4 weeks Follow up Appt: Future Appointments  Date Time Provider [place]  03/09/2018  2:00 PM Roma Schanz, CNM [facility]   Follow up Visit: Please schedule this patient for Postpartum visit in: 4 weeks with the following provider: Any provider For C/S patients schedule nurse incision check in weeks 2 weeks: yes High risk pregnancy complicated by: GDM Delivery mode:  SVD Anticipated Birth Control:  BTL done PP PP Procedures needed: Incision check  Schedule Integrated BH visit: no  Newborn Data: Live born female  Birth Weight: 7 lb 3 oz (3260 g) APGAR: 9, 9  Newborn Delivery   Birth date/time:  03/06/2018 11:39:00 Delivery type:  Vaginal, Spontaneous    Baby Feeding: Bottle Disposition:home with mother  03/08/2018 Earlie Raveling, MD  Attestation: I have seen this patient and agree with the resident's documentation. I have examined them separately, and we have discussed the plan of care.  Lambert Mody. Juleen China, DO OB/GYN Fellow

## 2018-03-09 ENCOUNTER — Telehealth: Payer: Self-pay | Admitting: *Deleted

## 2018-03-09 ENCOUNTER — Encounter: Payer: Medicaid Other | Admitting: Women's Health

## 2018-03-09 ENCOUNTER — Encounter (HOSPITAL_COMMUNITY): Payer: Self-pay

## 2018-03-09 NOTE — Telephone Encounter (Signed)
Lmom for pt to call us back to schedule pp appointment.  03-09-18  AS

## 2018-04-03 ENCOUNTER — Other Ambulatory Visit: Payer: Self-pay | Admitting: Advanced Practice Midwife

## 2018-04-03 MED ORDER — ALPRAZOLAM 1 MG PO TABS: ORAL_TABLET | ORAL | 0 refills | Status: DC

## 2018-04-03 NOTE — Progress Notes (Signed)
Xanax 1mg  #15 no RF for anxiety/trouble sleeping. , husband's funeral Sunday. Referral to [facility].

## 2018-04-10 ENCOUNTER — Ambulatory Visit: Payer: Medicaid Other | Admitting: Women's Health

## 2018-04-10 ENCOUNTER — Encounter: Payer: Self-pay | Admitting: Women's Health

## 2018-05-25 DIAGNOSIS — Z029 Encounter for administrative examinations, unspecified: Secondary | ICD-10-CM

## 2018-06-11 ENCOUNTER — Telehealth: Payer: Self-pay | Admitting: *Deleted

## 2018-06-11 NOTE — Telephone Encounter (Signed)
Patient called stating she has the arm cuff does she need to drop it off tomorrow? Patient states she has a uti and would like something to be called in. Please advise [phone]

## 2018-06-11 NOTE — Telephone Encounter (Signed)
Pt c/o frequent urination, pelvic pain, vaginal discharge white in color, and itching. Advised that she could come by the office tomorrow to leave a urine sample to rule out uti. Pt verbalized understanding.

## 2018-06-12 ENCOUNTER — Other Ambulatory Visit: Payer: Medicaid Other

## 2018-06-12 ENCOUNTER — Other Ambulatory Visit: Payer: Self-pay

## 2018-06-12 ENCOUNTER — Other Ambulatory Visit (INDEPENDENT_AMBULATORY_CARE_PROVIDER_SITE_OTHER): Payer: Medicaid Other

## 2018-06-12 DIAGNOSIS — R350 Frequency of micturition: Secondary | ICD-10-CM

## 2018-06-12 LAB — POCT URINALYSIS DIPSTICK OB
Blood, UA: NEGATIVE
Glucose, UA: NEGATIVE
Ketones, UA: NEGATIVE
Leukocytes, UA: NEGATIVE
Nitrite, UA: NEGATIVE
PROTEIN: NEGATIVE

## 2018-06-13 LAB — MICROSCOPIC EXAMINATION
Casts: NONE SEEN /lpf
Epithelial Cells (non renal): >10 /hpf — AB (ref 0–10)

## 2018-06-13 LAB — URINALYSIS, ROUTINE W REFLEX MICROSCOPIC
Bilirubin, UA: NEGATIVE
Glucose, UA: NEGATIVE
Ketones, UA: NEGATIVE
Nitrite, UA: NEGATIVE
Protein,UA: NEGATIVE
RBC, UA: NEGATIVE
Specific Gravity, UA: 1.02 (ref 1.005–1.030)
Urobilinogen, Ur: 0.2 mg/dL (ref 0.2–1.0)
pH, UA: 5 (ref 5.0–7.5)

## 2018-06-14 LAB — URINE CULTURE: Organism ID, Bacteria: NO GROWTH

## 2018-06-18 ENCOUNTER — Telehealth: Payer: Self-pay | Admitting: *Deleted

## 2018-06-18 NOTE — Telephone Encounter (Signed)
LMOVM that urine culture was negative 

## 2018-06-30 ENCOUNTER — Telehealth: Payer: Self-pay | Admitting: Obstetrics & Gynecology

## 2018-06-30 ENCOUNTER — Other Ambulatory Visit: Payer: Self-pay | Admitting: Obstetrics & Gynecology

## 2018-06-30 MED ORDER — MEGESTROL ACETATE 40 MG PO TABS: ORAL_TABLET | ORAL | 3 refills | Status: DC

## 2018-06-30 NOTE — Telephone Encounter (Signed)
Spoke with patient, per Dr. Elonda Husky he will send in medication to stop bleeding. Patient also interested in birth control to regulate periods.

## 2018-06-30 NOTE — Telephone Encounter (Signed)
Pt states that she had her menstrual cycle for 8 days that ended on Saturday. She states that yesterday she started again and that it is very heavy with blood clots and bad cramping in abdomen and lower back. Requesting to speak with a nurse and possible come in for an appointment.

## 2018-08-26 ENCOUNTER — Telehealth: Payer: Self-pay | Admitting: Obstetrics & Gynecology

## 2018-08-26 ENCOUNTER — Telehealth: Payer: Self-pay | Admitting: Adult Health

## 2018-08-26 NOTE — Telephone Encounter (Signed)
To come in tomorrow at 3:30 to be seen for bleeding for 2 months now

## 2018-08-26 NOTE — Telephone Encounter (Signed)
Left message to call back for appt to stop the bleeding

## 2018-08-26 NOTE — Telephone Encounter (Signed)
Pt is requesting an appt to discuss birth control. She states that the last 2 months she has had 2 periods each month.

## 2018-08-26 NOTE — Telephone Encounter (Signed)
Spoke with pt. Pt got tubal 6 months ago. Pt has had heavy periods the last 2 months. Has had 2 periods the last 2 months. Some cramping. Pt is sexually active. Should this be an in person visit or televisit? Thanks!! [place]

## 2018-08-27 ENCOUNTER — Ambulatory Visit (INDEPENDENT_AMBULATORY_CARE_PROVIDER_SITE_OTHER): Payer: Medicaid Other | Admitting: Adult Health

## 2018-08-27 ENCOUNTER — Encounter: Payer: Self-pay | Admitting: Adult Health

## 2018-08-27 ENCOUNTER — Other Ambulatory Visit: Payer: Self-pay

## 2018-08-27 VITALS — BP 108/63 | HR 76 | Ht 67.5 in | Wt 195.5 lb

## 2018-08-27 DIAGNOSIS — N939 Abnormal uterine and vaginal bleeding, unspecified: Secondary | ICD-10-CM | POA: Diagnosis not present

## 2018-08-27 DIAGNOSIS — Z3202 Encounter for pregnancy test, result negative: Secondary | ICD-10-CM

## 2018-08-27 DIAGNOSIS — F418 Other specified anxiety disorders: Secondary | ICD-10-CM | POA: Diagnosis not present

## 2018-08-27 DIAGNOSIS — N921 Excessive and frequent menstruation with irregular cycle: Secondary | ICD-10-CM

## 2018-08-27 LAB — POCT HEMOGLOBIN: Hemoglobin: 12.2 g/dL (ref 11–14.6)

## 2018-08-27 LAB — POCT URINE PREGNANCY: Preg Test, Ur: NEGATIVE

## 2018-08-27 MED ORDER — BUPROPION HCL ER (XL) 150 MG PO TB24: 150.0000 mg | ORAL_TABLET | Freq: Every day | ORAL | 3 refills | Status: DC

## 2018-08-27 MED ORDER — MEGESTROL ACETATE 40 MG PO TABS: ORAL_TABLET | ORAL | 1 refills | Status: DC

## 2018-08-27 NOTE — Progress Notes (Signed)
Patient ID: Kimberly Olsen, female   DOB: May 29, 1990, 28 y.o.   MRN: [id number] History of Present Illness: Kimberly Olsen is a 28 year old white female, widowed, G3P3 in complaining of heavy periods and 2 periods per month. She had postpartum tubal and her husband was killed in MVA this year.     Current Medications, Allergies, Past Medical History, Past Surgical History, Family History and Social History were reviewed in [employer] record.     Review of Systems: Had tubal with Filshie clips 03/07/18, postpartum Bleeding heavy at times, changes super tampon every hour +cramps Last 2 months, 2 periods per month Feels teary and sad at times, and not sleeping well    Physical Exam:BP 108/63 (BP Location: Left Arm, Patient Position: Sitting, Cuff Size: Normal)   Pulse 76   Ht 5' 7.5" (1.715 m)   Wt 195 lb 8 oz (88.7 kg)   LMP 08/25/2018   Breastfeeding No   BMI 30.17 kg/m   UPT is negative. HGB is 12.2 General:  Well developed, well nourished, no acute distress Skin:  Warm and dry Pelvic:  External genitalia is normal in appearance, no lesions.  The vagina is normal in appearance, +blood. Urethra has no lesions or masses. The cervix is bulbous.  Uterus is felt to be normal size, shape, and contour.  No adnexal masses or tenderness noted.Bladder is non tender, no masses felt. Psych:  No mood changes, alert and cooperative,seems sad Fall risk is low PHQ 9 score is 18, declines being suicidal, and is asking for meds,  Examination chaperoned by Levy Pupa LPN. Will rx megace to stop bleeding and wil rx Wellbutrin.  Impression: 1. Abnormal vaginal bleeding   2. Pregnancy examination or test, negative result   3. Menorrhagia with irregular cycle   4. Depression with anxiety       Plan: Meds ordered this encounter  Medications  . megestrol (MEGACE) 40 MG tablet    Sig: Take 3 x 5 days then 2 x 5 days then 1 daily    Dispense:  45 tablet    Refill:  1   Order Specific Question:   Supervising Provider    Answer:   Elonda Husky, LUTHER H [[id number]]  . buPROPion (WELLBUTRIN XL) 150 MG 24 hr tablet    Sig: Take 1 tablet (150 mg total) by mouth daily.    Dispense:  30 tablet    Refill:  3    Order Specific Question:   Supervising Provider    Answer:   Tania Ade H [[id number]]   Follow up in 4 weeks, if not better will add buspar

## 2018-09-16 ENCOUNTER — Telehealth: Payer: Self-pay | Admitting: Adult Health

## 2018-09-16 NOTE — Telephone Encounter (Signed)
Pt getting hot and anxious with mask at work.will check with Dr Elonda Husky regarding face mask notes and get nurse to call her back tomorrow.

## 2018-09-16 NOTE — Telephone Encounter (Signed)
Patient called, requesting to speak to [facility].   [phone]

## 2018-09-17 NOTE — Telephone Encounter (Signed)
Left message letting pt know Dr. Elonda Husky had to leave for surgery in a hurry. Was unable to speak to Dr. Elonda Husky. Will get an answer next week. [place]

## 2018-09-21 NOTE — Telephone Encounter (Signed)
Left message giving pt Dr. Brynda Greathouse recommendations. [place]

## 2018-09-21 NOTE — Telephone Encounter (Signed)
Is there a question here?

## 2018-09-21 NOTE — Telephone Encounter (Signed)
Well there is a state wide order that I would do the best I could to comply with  Maybe she can try a paper mak like we use which may work better for her

## 2018-09-23 ENCOUNTER — Telehealth: Payer: Self-pay | Admitting: Adult Health

## 2018-09-23 ENCOUNTER — Telehealth: Payer: Self-pay | Admitting: *Deleted

## 2018-09-23 NOTE — Telephone Encounter (Signed)

## 2018-09-23 NOTE — Telephone Encounter (Signed)
I called pt left message regarding appt info and covid restrictions

## 2018-09-24 ENCOUNTER — Encounter: Payer: Self-pay | Admitting: Adult Health

## 2018-09-24 ENCOUNTER — Other Ambulatory Visit: Payer: Self-pay

## 2018-09-24 ENCOUNTER — Ambulatory Visit (INDEPENDENT_AMBULATORY_CARE_PROVIDER_SITE_OTHER): Payer: Medicaid Other | Admitting: Adult Health

## 2018-09-24 VITALS — BP 107/62 | HR 83 | Ht 67.2 in | Wt 193.6 lb

## 2018-09-24 DIAGNOSIS — F418 Other specified anxiety disorders: Secondary | ICD-10-CM

## 2018-09-24 MED ORDER — HYDROXYZINE HCL 10 MG PO TABS: 10.0000 mg | ORAL_TABLET | Freq: Three times a day (TID) | ORAL | 2 refills | Status: DC | PRN

## 2018-09-24 NOTE — Progress Notes (Signed)
Patient ID: Kimberly Olsen, female   DOB: 03-02-1991, 28 y.o.   MRN: [id number] History of Present Illness: Kimberly Olsen is a 28 year old white female, widowed back in follow up on starting Wellbutrin and depression is better but anxiety increasing.   Current Medications, Allergies, Past Medical History, Past Surgical History, Family History and Social History were reviewed in [employer] record.     Review of Systems: Depression is better, not crying  But anxiety is increasing, not sleeping well, mind races    Physical Exam:BP 107/62 (BP Location: Right Arm, Patient Position: Sitting, Cuff Size: Normal)   Pulse 83   Ht 5' 7.2" (1.707 m)   Wt 193 lb 9.6 oz (87.8 kg)   LMP 09/15/2018   BMI 30.14 kg/m  General:  Well developed, well nourished, no acute distress Skin:  Warm and dry Lungs; Clear to auscultation bilaterally Cardiovascular: Regular rate and rhythm Psych:  No mood changes, alert and cooperative,seems happy PHQ 9 score is 12 which is down from 18 on 08/27/18 Will add vistaril   Impression: 1. Depression with anxiety       Plan: Continue Wellbutrin,has refills  Will add vistaril Meds ordered this encounter  Medications  . hydrOXYzine (ATARAX/VISTARIL) 10 MG tablet    Sig: Take 1 tablet (10 mg total) by mouth 3 (three) times daily as needed.    Dispense:  30 tablet    Refill:  2    Order Specific Question:   Supervising Provider    Answer:   Tania Ade H [[id number]]  Follow up in 4 weeks or sooner if needed

## 2018-10-16 ENCOUNTER — Telehealth: Payer: Self-pay | Admitting: Adult Health

## 2018-10-16 NOTE — Telephone Encounter (Signed)

## 2018-10-19 ENCOUNTER — Ambulatory Visit: Payer: Medicaid Other | Admitting: Adult Health

## 2018-10-22 ENCOUNTER — Other Ambulatory Visit: Payer: Self-pay

## 2018-10-22 DIAGNOSIS — Z20822 Contact with and (suspected) exposure to covid-19: Secondary | ICD-10-CM

## 2018-10-23 LAB — NOVEL CORONAVIRUS, NAA: SARS-CoV-2, NAA: NOT DETECTED

## 2018-10-26 ENCOUNTER — Telehealth: Payer: Self-pay | Admitting: *Deleted

## 2018-10-26 NOTE — Telephone Encounter (Signed)
Patient is returning call for results- notified negative for COVID. Patient tested due to symptoms- cough, sore throat , runny nose. She states she is improving. Patient advised to see PCP for symptoms if she feels she needs to- and she states she will.

## 2018-11-02 ENCOUNTER — Ambulatory Visit: Payer: Medicaid Other | Admitting: Obstetrics & Gynecology

## 2018-11-12 ENCOUNTER — Ambulatory Visit: Payer: Medicaid Other | Admitting: Obstetrics & Gynecology

## 2018-11-20 ENCOUNTER — Ambulatory Visit: Payer: Medicaid Other | Admitting: Obstetrics & Gynecology

## 2018-11-24 ENCOUNTER — Other Ambulatory Visit: Payer: Self-pay

## 2018-11-24 DIAGNOSIS — Z20822 Contact with and (suspected) exposure to covid-19: Secondary | ICD-10-CM

## 2018-11-26 LAB — NOVEL CORONAVIRUS, NAA: SARS-CoV-2, NAA: NOT DETECTED

## 2019-01-07 ENCOUNTER — Other Ambulatory Visit: Payer: Self-pay | Admitting: Internal Medicine

## 2019-01-07 ENCOUNTER — Other Ambulatory Visit: Payer: Self-pay | Admitting: *Deleted

## 2019-01-07 DIAGNOSIS — Z20822 Contact with and (suspected) exposure to covid-19: Secondary | ICD-10-CM

## 2019-01-09 LAB — NOVEL CORONAVIRUS, NAA: SARS-CoV-2, NAA: NOT DETECTED

## 2019-01-12 LAB — NOVEL CORONAVIRUS, NAA: SARS-CoV-2, NAA: NOT DETECTED

## 2019-01-20 ENCOUNTER — Ambulatory Visit: Payer: Medicaid Other | Admitting: Adult Health

## 2019-01-21 ENCOUNTER — Encounter: Payer: Self-pay | Admitting: Adult Health

## 2019-01-21 ENCOUNTER — Ambulatory Visit (INDEPENDENT_AMBULATORY_CARE_PROVIDER_SITE_OTHER): Payer: Medicaid Other | Admitting: Adult Health

## 2019-01-21 ENCOUNTER — Other Ambulatory Visit: Payer: Self-pay

## 2019-01-21 VITALS — BP 96/59 | HR 69 | Ht 67.5 in | Wt 197.0 lb

## 2019-01-21 DIAGNOSIS — D171 Benign lipomatous neoplasm of skin and subcutaneous tissue of trunk: Secondary | ICD-10-CM

## 2019-01-21 NOTE — Progress Notes (Signed)
  Subjective:     Patient ID: Kimberly Olsen, female   DOB: 08-Sep-1990, 28 y.o.   MRN: [id number]  HPI Kimberly Olsen is a 28 year old white female, widowed, G3P3 in complaining of knot lower back for about 2 weeks, hurts with twisting, and seems bigger.She says ibuprofen does not help and has seen chiropractor.  Review of Systems +knot on lower let back for about 2 weeks,feels like it is bigger  twisting makes it hurt Ibuprofen does not help Has seen chiropractor too  Reviewed past medical,surgical, social and family history. Reviewed medications and allergies.      Objective:   Physical Exam BP (!) 96/59 (BP Location: Left Arm, Patient Position: Sitting, Cuff Size: Normal)   Pulse 69   Ht 5' 7.5" (1.715 m)   Wt 197 lb (89.4 kg)   LMP 01/20/2019 (Exact Date)   BMI 30.40 kg/m   Skin warm and dry.  Lungs: clear to ausculation bilaterally. Cardiovascular: regular rate and rhythm. Has oval rubbery mobile mass in left lower back, about 2 cms.    Assessment:     1. Lipoma of back       Plan:     Referred to Dr Constance Haw for possible excision  Follow up prn

## 2019-02-04 ENCOUNTER — Other Ambulatory Visit: Payer: Self-pay

## 2019-02-04 ENCOUNTER — Ambulatory Visit (INDEPENDENT_AMBULATORY_CARE_PROVIDER_SITE_OTHER): Payer: Medicaid Other | Admitting: General Surgery

## 2019-02-04 ENCOUNTER — Encounter: Payer: Self-pay | Admitting: General Surgery

## 2019-02-04 VITALS — BP 97/57 | HR 81 | Temp 98.0°F | Resp 16 | Ht 67.5 in | Wt 196.0 lb

## 2019-02-04 DIAGNOSIS — D171 Benign lipomatous neoplasm of skin and subcutaneous tissue of trunk: Secondary | ICD-10-CM | POA: Diagnosis not present

## 2019-02-04 NOTE — Patient Instructions (Signed)
Lipoma  A lipoma is a noncancerous (benign) tumor that is made up of fat cells. This is a very common type of soft-tissue growth. Lipomas are usually found under the skin (subcutaneous). They may occur in any tissue of the body that contains fat. Common areas for lipomas to appear include the back, shoulders, buttocks, and thighs.  Lipomas grow slowly, and they are usually painless. Most lipomas do not cause problems and do not require treatment. What are the causes? The cause of this condition is not known. What increases the risk? You are more likely to develop this condition if:  You are 70-27 years old.  You have a family history of lipomas. What are the signs or symptoms? A lipoma usually appears as a small, round bump under the skin. In most cases, the lump will:  Feel soft or rubbery.  Not cause pain or other symptoms. However, if a lipoma is located in an area where it pushes on nerves, it can become painful or cause other symptoms. How is this diagnosed? A lipoma can usually be diagnosed with a physical exam. You may also have tests to confirm the diagnosis and to rule out other conditions. Tests may include:  Imaging tests, such as a CT scan or MRI.  Removal of a tissue sample to be looked at under a microscope (biopsy). How is this treated? Treatment for this condition depends on the size of the lipoma and whether it is causing any symptoms.  For small lipomas that are not causing problems, no treatment is needed.  If a lipoma is bigger or it causes problems, surgery may be done to remove the lipoma. Lipomas can also be removed to improve appearance. Most often, the procedure is done after applying a medicine that numbs the area (local anesthetic). Follow these instructions at home:  Watch your lipoma for any changes.  Keep all follow-up visits as told by your health care provider. This is important. Contact a health care provider if:  Your lipoma becomes larger or  hard.  Your lipoma becomes painful, red, or increasingly swollen. These could be signs of infection or a more serious condition. Get help right away if:  You develop tingling or numbness in an area near the lipoma. This could indicate that the lipoma is causing nerve damage. Summary  A lipoma is a noncancerous tumor that is made up of fat cells.  Most lipomas do not cause problems and do not require treatment.  If a lipoma is bigger or it causes problems, surgery may be done to remove the lipoma. This information is not intended to replace advice given to you by your health care provider. Make sure you discuss any questions you have with your health care provider. Document Released: 02/22/2002 Document Revised: 02/18/2017 Document Reviewed: 02/18/2017 Elsevier Patient Education  [place].   Lipoma Removal Lipoma removal is a surgical procedure to remove a noncancerous (benign) tumor that is made up of fat cells (lipoma). Most lipomas are small and painless and do not require treatment. They can form in many areas of the body but are most common under the skin of the back, shoulders, arms, and thighs. You may need lipoma removal if you have a lipoma that is large, growing, or causing discomfort. Lipoma removal may also be done for cosmetic reasons. Tell a health care provider about:  Any allergies you have.  All medicines you are taking, including vitamins, herbs, eye drops, creams, and over-the-counter medicines.  Any problems you  or family members have had with anesthetic medicines.  Any blood disorders you have.  Any surgeries you have had.  Any medical conditions you have.  Whether you are pregnant or may be pregnant. What are the risks? Generally, this is a safe procedure. However, problems may occur, including:  Infection.  Bleeding.  Allergic reactions to medicines.  Damage to nerves or blood vessels near the lipoma.  Scarring. Medicines  Ask your  health care provider about: ? Changing or stopping your regular medicines. This is especially important if you are taking diabetes medicines or blood thinners. ? Taking medicines such as aspirin and ibuprofen. These medicines can thin your blood. Do not take these medicines before your procedure if your health care provider instructs you not to.  You may be given antibiotic medicine to help prevent infection. General instructions  Ask your health care provider how your surgical site will be marked or identified.  You will have a physical exam. Your health care provider will check the size of the lipoma and whether it can be moved easily.  You may have imaging tests, such as: ? X-rays. ? CT scan. ? MRI.  Plan to have someone take you home from the hospital or clinic. What happens during the procedure?  To reduce your risk of infection: ? Your health care team will wash or sanitize their hands. ? Your skin will be washed with soap.  You will be given one or more of the following: ? A medicine to help you relax (sedative). ? A medicine to numb the area (local anesthetic). ? A medicine to make you fall asleep (general anesthetic). ? A medicine that is injected into an area of your body to numb everything below the injection site (regional anesthetic).  An incision will be made over the lipoma or very near the lipoma. The incision may be made in a natural skin line or crease.  Tissues, nerves, and blood vessels near the lipoma will be moved out of the way.  The lipoma and the capsule that surrounds it will be separated from the surrounding tissues.  The lipoma will be removed.  The incision may be closed with stitches (sutures).  A bandage (dressing) will be placed over the incision. What happens after the procedure?  Do not drive for 24 hours if you received a sedative.  Your blood pressure, heart rate, breathing rate, and blood oxygen level will be monitored until the  medicines you were given have worn off. This information is not intended to replace advice given to you by your health care provider. Make sure you discuss any questions you have with your health care provider. Document Released: 05/18/2015 Document Revised: 10/26/2015 Document Reviewed: 05/18/2015 Elsevier Patient Education  2020 [employer].

## 2019-02-04 NOTE — Progress Notes (Signed)
[facility] History and Physical  Reason for Referral: Lipoma on back  Referring Physician: Derrek Monaco, NP   Chief Complaint    Lipoma      Kimberly Olsen is a 28 y.o. female.  HPI: Kimberly Olsen is a very pleasant 28 yo with a history of some lower back pain and findings of a lipoma on the lower left back. She says she has felt this area for a few weeks and says that she feels like this area has been getting larger. She has been seen by a chiropractor without relief of her back pain and is worried that the pain is associated with this lipoma. She says that the back hurts most with motion and movement, and ibuprofen has not helped.  She wants to get this excised.  Otherwise she is healthy and takes no prescription medications.  Past Medical History:  Diagnosis Date  . Anemia   . Anxiety   . Depression     Past Surgical History:  Procedure Laterality Date  . OOPHORECTOMY    . TUBAL LIGATION Bilateral 03/07/2018   Procedure: POST PARTUM TUBAL LIGATION;  Surgeon: Chancy Milroy, MD;  Location: [facility];  Service: Gynecology;  Laterality: Bilateral;    Family History  Problem Relation Age of Onset  . Depression Mother   . Asthma Mother   . Mental illness Mother   . Other Mother        drug over dose  . Depression Father   . Mental illness Father   . Anesthesia problems Neg Hx   . Hypotension Neg Hx   . Malignant hyperthermia Neg Hx   . Pseudochol deficiency Neg Hx     Social History   Tobacco Use  . Smoking status: Former Research scientist (life sciences)  . Smokeless tobacco: Never Used  Substance Use Topics  . Alcohol use: No  . Drug use: Yes    Types: Marijuana    Comment: daily    Medications: I have reviewed the patient's current medications. Allergies as of 02/04/2019      Reactions   Coconut Fatty Acids Hives   Sulfa Antibiotics Hives, Rash      Medication List       Accurate as of February 04, 2019  2:02 PM. If you have any questions, ask  your nurse or doctor.        acetaminophen 325 MG tablet Commonly known as: Tylenol Take 2 tablets (650 mg total) by mouth every 4 (four) hours as needed (for pain scale < 4).   multivitamin with minerals tablet Take 1 tablet by mouth daily.        ROS:  A comprehensive review of systems was negative except for: Gastrointestinal: positive for reflux symptoms Musculoskeletal: positive for back pain  Blood pressure (!) 97/57, pulse 81, temperature 98 F (36.7 C), temperature source Oral, resp. rate 16, height 5' 7.5" (1.715 m), weight 196 lb (88.9 kg), last menstrual period 01/20/2019, SpO2 96 %, not currently breastfeeding. Physical Exam Vitals signs reviewed.  Constitutional:      Appearance: Normal appearance.  HENT:     Head: Normocephalic and atraumatic.     Nose: Nose normal.     Mouth/Throat:     Mouth: Mucous membranes are moist.  Eyes:     Extraocular Movements: Extraocular movements intact.     Pupils: Pupils are equal, round, and reactive to light.  Neck:     Musculoskeletal: Normal range of motion. No neck rigidity.  Cardiovascular:  Rate and Rhythm: Normal rate and regular rhythm.  Pulmonary:     Effort: Pulmonary effort is normal.     Breath sounds: Normal breath sounds.  Chest:     Comments: Lower left back over lateral sacrum with 1.5cm mobile area Abdominal:     General: There is no distension.     Palpations: Abdomen is soft.     Tenderness: There is no abdominal tenderness.  Musculoskeletal: Normal range of motion.        General: No swelling.  Skin:    General: Skin is warm and dry.  Neurological:     General: No focal deficit present.     Mental Status: She is alert and oriented to person, place, and time.  Psychiatric:        Mood and Affect: Mood normal.        Behavior: Behavior normal.        Thought Content: Thought content normal.        Judgment: Judgment normal.     Results: None   Assessment & Plan:  Kimberly Olsen is a  28 y.o. female with what appears to be a lipoma over the left lateral lower back. She is having pain and she thinks it is associated with this lipoma. She feels that it has been getting larger. It is difficult to know if it is superficial or if any muscle fibers are involved.  We have discussed that the lipoma could be causing irritation of the muscle or small nerves in the muscle and causing pain, but that this may not be the source of all of her back pain. We discussed the option of excision under local and sedation. We discussed the risk of bleeding, infection, recurrence, and sending the mass off to pathology to confirm lipoma as the diagnosis.   -She wants to proceed with sedation and excision.  -OR for excision of the lipoma   All questions were answered to the satisfaction of the patient.   Virl Cagey 02/04/2019, 2:02 PM

## 2019-02-07 NOTE — H&P (Signed)
[facility] History and Physical  Reason for Referral: Lipoma on back  Referring Physician: Derrek Monaco, NP      Chief Complaint    Lipoma      Kimberly Olsen is a 28 y.o. female.  HPI: Kimberly Olsen is a very pleasant 28 yo with a history of some lower back pain and findings of a lipoma on the lower left back. She says she has felt this area for a few weeks and says that she feels like this area has been getting larger. She has been seen by a chiropractor without relief of her back pain and is worried that the pain is associated with this lipoma. She says that the back hurts most with motion and movement, and ibuprofen has not helped.  She wants to get this excised.  Otherwise she is healthy and takes no prescription medications.      Past Medical History:  Diagnosis Date  . Anemia   . Anxiety   . Depression          Past Surgical History:  Procedure Laterality Date  . OOPHORECTOMY    . TUBAL LIGATION Bilateral 03/07/2018   Procedure: POST PARTUM TUBAL LIGATION;  Surgeon: Kimberly Milroy, MD;  Location: [place];  Service: Gynecology;  Laterality: Bilateral;         Family History  Problem Relation Age of Onset  . Depression Mother   . Asthma Mother   . Mental illness Mother   . Other Mother        drug over dose  . Depression Father   . Mental illness Father   . Anesthesia problems Neg Hx   . Hypotension Neg Hx   . Malignant hyperthermia Neg Hx   . Pseudochol deficiency Neg Hx     Social History        Tobacco Use  . Smoking status: Former Research scientist (life sciences)  . Smokeless tobacco: Never Used  Substance Use Topics  . Alcohol use: No  . Drug use: Yes    Types: Marijuana    Comment: daily    Medications: I have reviewed the patient's current medications.      Allergies as of 02/04/2019      Reactions   Coconut Fatty Acids Hives   Sulfa Antibiotics Hives, Rash         Medication List        Accurate as of February 04, 2019  2:02 PM. If you have any questions, ask your nurse or doctor.        acetaminophen 325 MG tablet Commonly known as: Tylenol Take 2 tablets (650 mg total) by mouth every 4 (four) hours as needed (for pain scale < 4).   multivitamin with minerals tablet Take 1 tablet by mouth daily.        ROS:  A comprehensive review of systems was negative except for: Gastrointestinal: positive for reflux symptoms Musculoskeletal: positive for back pain  Blood pressure (!) 97/57, pulse 81, temperature 98 F (36.7 C), temperature source Oral, resp. rate 16, height 5' 7.5" (1.715 m), weight 196 lb (88.9 kg), last menstrual period 01/20/2019, SpO2 96 %, not currently breastfeeding. Physical Exam Vitals signs reviewed.  Constitutional:      Appearance: Normal appearance.  HENT:     Head: Normocephalic and atraumatic.     Nose: Nose normal.     Mouth/Throat:     Mouth: Mucous membranes are moist.  Eyes:     Extraocular Movements: Extraocular movements  intact.     Pupils: Pupils are equal, round, and reactive to light.  Neck:     Musculoskeletal: Normal range of motion. No neck rigidity.  Cardiovascular:     Rate and Rhythm: Normal rate and regular rhythm.  Pulmonary:     Effort: Pulmonary effort is normal.     Breath sounds: Normal breath sounds.  Chest:     Comments: Lower left back over lateral sacrum with 1.5cm mobile area Abdominal:     General: There is no distension.     Palpations: Abdomen is soft.     Tenderness: There is no abdominal tenderness.  Musculoskeletal: Normal range of motion.        General: No swelling.  Skin:    General: Skin is warm and dry.  Neurological:     General: No focal deficit present.     Mental Status: She is alert and oriented to person, place, and time.  Psychiatric:        Mood and Affect: Mood normal.        Behavior: Behavior normal.        Thought Content: Thought content normal.         Judgment: Judgment normal.     Results: None   Assessment & Plan:  Kimberly Olsen is a 28 y.o. female with what appears to be a lipoma over the left lateral lower back. She is having pain and she thinks it is associated with this lipoma. She feels that it has been getting larger. It is difficult to know if it is superficial or if any muscle fibers are involved.  We have discussed that the lipoma could be causing irritation of the muscle or small nerves in the muscle and causing pain, but that this may not be the source of all of her back pain. We discussed the option of excision under local and sedation. We discussed the risk of bleeding, infection, recurrence, and sending the mass off to pathology to confirm lipoma as the diagnosis.   -She wants to proceed with sedation and excision.  -OR for excision of the lipoma   All questions were answered to the satisfaction of the patient.   Kimberly Olsen 02/04/2019, 2:02 PM

## 2019-02-09 NOTE — Patient Instructions (Signed)
Kimberly Olsen  02/09/2019     @PREFPERIOPPHARMACY @   Your procedure is scheduled on  02/17/2019.  Report to [facility] at  [phone]  A.M.  Call this number if you have problems the morning of surgery:  [phone]   Remember:  Do not eat or drink after midnight.                         Take these medicines the morning of surgery with A SIP OF WATER None    Do not wear jewelry, make-up or nail polish.  Do not wear lotions, powders, or perfumes. Please wear deodorant and brush your teeth.  Do not shave 48 hours prior to surgery.  Men may shave face and neck.  Do not bring valuables to the hospital.  [facility] is not responsible for any belongings or valuables.  Contacts, dentures or bridgework may not be worn into surgery.  Leave your suitcase in the car.  After surgery it may be brought to your room.  For patients admitted to the hospital, discharge time will be determined by your treatment team.  Patients discharged the day of surgery will not be allowed to drive home.   Name and phone number of your driver:   family Special instructions:  None  Please read over the following fact sheets that you were given. Anesthesia Post-op Instructions and Care and Recovery After Surgery       Lipoma Removal, Care After Refer to this sheet in the next few weeks. These instructions provide you with information about caring for yourself after your procedure. Your health care provider may also give you more specific instructions. Your treatment has been planned according to current medical practices, but problems sometimes occur. Call your health care provider if you have any problems or questions after your procedure. What can I expect after the procedure? After the procedure, it is common to have:  Mild pain.  Swelling.  Bruising. Follow these instructions at home:  Bathing  Do not take baths, swim, or use a hot tub until your health care provider approves. Ask your  health care provider if you can take showers. You may only be allowed to take sponge baths for bathing.  Keep your bandage (dressing) dry until your health care provider says it can be removed. Incision care   Follow instructions from your health care provider about how to take care of your incision. Make sure you: ? Wash your hands with soap and water before you change your bandage (dressing). If soap and water are not available, use hand sanitizer. ? Change your dressing as told by your health care provider. ? Leave stitches (sutures), skin glue, or adhesive strips in place. These skin closures may need to stay in place for 2 weeks or longer. If adhesive strip edges start to loosen and curl up, you may trim the loose edges. Do not remove adhesive strips completely unless your health care provider tells you to do that.  Check your incision area every day for signs of infection. Check for: ? More redness, swelling, or pain. ? Fluid or blood. ? Warmth. ? Pus or a bad smell. Driving  Do not drive or operate heavy machinery while taking prescription pain medicine.  Do not drive for 24 hours if you received a medicine to help you relax (sedative) during your procedure.  Ask your health care provider when it is safe for you  to drive. General instructions  Take over-the-counter and prescription medicines only as told by your health care provider.  Do not use any tobacco products, such as cigarettes, chewing tobacco, and e-cigarettes. These can delay healing. If you need help quitting, ask your health care provider.  Return to your normal activities as told by your health care provider. Ask your health care provider what activities are safe for you.  Keep all follow-up visits as told by your health care provider. This is important. Contact a health care provider if:  You have more redness, swelling, or pain around your incision.  You have fluid or blood coming from your incision.  Your  incision feels warm to the touch.  You have pus or a bad smell coming from your incision.  You have pain that does not get better with medicine. Get help right away if:  You have chills or a fever.  You have severe pain. This information is not intended to replace advice given to you by your health care provider. Make sure you discuss any questions you have with your health care provider. Document Released: 05/18/2015 Document Revised: 10/26/2015 Document Reviewed: 05/18/2015 Elsevier Patient Education  2020 [place] After These instructions provide you with information about caring for yourself after your procedure. Your health care provider may also give you more specific instructions. Your treatment has been planned according to current medical practices, but problems sometimes occur. Call your health care provider if you have any problems or questions after your procedure. What can I expect after the procedure? After your procedure, you may:  Feel sleepy for several hours.  Feel clumsy and have poor balance for several hours.  Feel forgetful about what happened after the procedure.  Have poor judgment for several hours.  Feel nauseous or vomit.  Have a sore throat if you had a breathing tube during the procedure. Follow these instructions at home: For at least 24 hours after the procedure:      Have a responsible adult stay with you. It is important to have someone help care for you until you are awake and alert.  Rest as needed.  Do not: ? Participate in activities in which you could fall or become injured. ? Drive. ? Use heavy machinery. ? Drink alcohol. ? Take sleeping pills or medicines that cause drowsiness. ? Make important decisions or sign legal documents. ? Take care of children on your own. Eating and drinking  Follow the diet that is recommended by your health care provider.  If you vomit, drink water, juice, or  soup when you can drink without vomiting.  Make sure you have little or no nausea before eating solid foods. General instructions  Take over-the-counter and prescription medicines only as told by your health care provider.  If you have sleep apnea, surgery and certain medicines can increase your risk for breathing problems. Follow instructions from your health care provider about wearing your sleep device: ? Anytime you are sleeping, including during daytime naps. ? While taking prescription pain medicines, sleeping medicines, or medicines that make you drowsy.  If you smoke, do not smoke without supervision.  Keep all follow-up visits as told by your health care provider. This is important. Contact a health care provider if:  You keep feeling nauseous or you keep vomiting.  You feel light-headed.  You develop a rash.  You have a fever. Get help right away if:  You have trouble breathing. Summary  For  several hours after your procedure, you may feel sleepy and have poor judgment.  Have a responsible adult stay with you for at least 24 hours or until you are awake and alert. This information is not intended to replace advice given to you by your health care provider. Make sure you discuss any questions you have with your health care provider. Document Released: 06/25/2015 Document Revised: 06/02/2017 Document Reviewed: 06/25/2015 Elsevier Patient Education  2020 [employer]. How to Use Chlorhexidine for Bathing Chlorhexidine gluconate (CHG) is a germ-killing (antiseptic) solution that is used to clean the skin. It can get rid of the bacteria that normally live on the skin and can keep them away for about 24 hours. To clean your skin with CHG, you may be given:  A CHG solution to use in the shower or as part of a sponge bath.  A prepackaged cloth that contains CHG. Cleaning your skin with CHG may help lower the risk for infection:  While you are staying in the intensive care  unit of the hospital.  If you have a vascular access, such as a central line, to provide short-term or long-term access to your veins.  If you have a catheter to drain urine from your bladder.  If you are on a ventilator. A ventilator is a machine that helps you breathe by moving air in and out of your lungs.  After surgery. What are the risks? Risks of using CHG include:  A skin reaction.  Hearing loss, if CHG gets in your ears.  Eye injury, if CHG gets in your eyes and is not rinsed out.  The CHG product catching fire. Make sure that you avoid smoking and flames after applying CHG to your skin. Do not use CHG:  If you have a chlorhexidine allergy or have previously reacted to chlorhexidine.  On babies younger than 98 months of age. How to use CHG solution  Use CHG only as told by your health care provider, and follow the instructions on the label.  Use the full amount of CHG as directed. Usually, this is one bottle. During a shower Follow these steps when using CHG solution during a shower (unless your health care provider gives you different instructions): 1. Start the shower. 2. Use your normal soap and shampoo to wash your face and hair. 3. Turn off the shower or move out of the shower stream. 4. Pour the CHG onto a clean washcloth. Do not use any type of brush or rough-edged sponge. 5. Starting at your neck, lather your body down to your toes. Make sure you follow these instructions: ? If you will be having surgery, pay special attention to the part of your body where you will be having surgery. Scrub this area for at least 1 minute. ? Do not use CHG on your head or face. If the solution gets into your ears or eyes, rinse them well with water. ? Avoid your genital area. ? Avoid any areas of skin that have broken skin, cuts, or scrapes. ? Scrub your back and under your arms. Make sure to wash skin folds. 6. Let the lather sit on your skin for 1-2 minutes or as long as  told by your health care provider. 7. Thoroughly rinse your entire body in the shower. Make sure that all body creases and crevices are rinsed well. 8. Dry off with a clean towel. Do not put any substances on your body afterward-such as powder, lotion, or perfume-unless you are told to  do so by your health care provider. Only use lotions that are recommended by the manufacturer. 9. Put on clean clothes or pajamas. 10. If it is the night before your surgery, sleep in clean sheets.  During a sponge bath Follow these steps when using CHG solution during a sponge bath (unless your health care provider gives you different instructions): 1. Use your normal soap and shampoo to wash your face and hair. 2. Pour the CHG onto a clean washcloth. 3. Starting at your neck, lather your body down to your toes. Make sure you follow these instructions: ? If you will be having surgery, pay special attention to the part of your body where you will be having surgery. Scrub this area for at least 1 minute. ? Do not use CHG on your head or face. If the solution gets into your ears or eyes, rinse them well with water. ? Avoid your genital area. ? Avoid any areas of skin that have broken skin, cuts, or scrapes. ? Scrub your back and under your arms. Make sure to wash skin folds. 4. Let the lather sit on your skin for 1-2 minutes or as long as told by your health care provider. 5. Using a different clean, wet washcloth, thoroughly rinse your entire body. Make sure that all body creases and crevices are rinsed well. 6. Dry off with a clean towel. Do not put any substances on your body afterward-such as powder, lotion, or perfume-unless you are told to do so by your health care provider. Only use lotions that are recommended by the manufacturer. 7. Put on clean clothes or pajamas. 8. If it is the night before your surgery, sleep in clean sheets. How to use CHG prepackaged cloths  Only use CHG cloths as told by your  health care provider, and follow the instructions on the label.  Use the CHG cloth on clean, dry skin.  Do not use the CHG cloth on your head or face unless your health care provider tells you to.  When washing with the CHG cloth: ? Avoid your genital area. ? Avoid any areas of skin that have broken skin, cuts, or scrapes. Before surgery Follow these steps when using a CHG cloth to clean before surgery (unless your health care provider gives you different instructions): 1. Using the CHG cloth, vigorously scrub the part of your body where you will be having surgery. Scrub using a back-and-forth motion for 3 minutes. The area on your body should be completely wet with CHG when you are done scrubbing. 2. Do not rinse. Discard the cloth and let the area air-dry. Do not put any substances on the area afterward, such as powder, lotion, or perfume. 3. Put on clean clothes or pajamas. 4. If it is the night before your surgery, sleep in clean sheets.  For general bathing Follow these steps when using CHG cloths for general bathing (unless your health care provider gives you different instructions). 1. Use a separate CHG cloth for each area of your body. Make sure you wash between any folds of skin and between your fingers and toes. Wash your body in the following order, switching to a new cloth after each step: ? The front of your neck, shoulders, and chest. ? Both of your arms, under your arms, and your hands. ? Your stomach and groin area, avoiding the genitals. ? Your right leg and foot. ? Your left leg and foot. ? The back of your neck, your back, and your buttocks.  2. Do not rinse. Discard the cloth and let the area air-dry. Do not put any substances on your body afterward-such as powder, lotion, or perfume-unless you are told to do so by your health care provider. Only use lotions that are recommended by the manufacturer. 3. Put on clean clothes or pajamas. Contact a health care provider if:   Your skin gets irritated after scrubbing.  You have questions about using your solution or cloth. Get help right away if:  Your eyes become very red or swollen.  Your eyes itch badly.  Your skin itches badly and is red or swollen.  Your hearing changes.  You have trouble seeing.  You have swelling or tingling in your mouth or throat.  You have trouble breathing.  You swallow any chlorhexidine. Summary  Chlorhexidine gluconate (CHG) is a germ-killing (antiseptic) solution that is used to clean the skin. Cleaning your skin with CHG may help to lower your risk for infection.  You may be given CHG to use for bathing. It may be in a bottle or in a prepackaged cloth to use on your skin. Carefully follow your health care provider's instructions and the instructions on the product label.  Do not use CHG if you have a chlorhexidine allergy.  Contact your health care provider if your skin gets irritated after scrubbing. This information is not intended to replace advice given to you by your health care provider. Make sure you discuss any questions you have with your health care provider. Document Released: 11/27/2011 Document Revised: 05/21/2018 Document Reviewed: 01/30/2017 Elsevier Patient Education  2020 [employer].

## 2019-02-15 ENCOUNTER — Other Ambulatory Visit: Payer: Self-pay

## 2019-02-15 ENCOUNTER — Other Ambulatory Visit (HOSPITAL_COMMUNITY)
Admission: RE | Admit: 2019-02-15 | Discharge: 2019-02-15 | Disposition: A | Discharge status: Home / Self Care | Payer: Medicaid Other | Source: Ambulatory Visit | Attending: General Surgery | Admitting: General Surgery

## 2019-02-15 ENCOUNTER — Encounter (HOSPITAL_COMMUNITY): Payer: Self-pay

## 2019-02-15 ENCOUNTER — Encounter (HOSPITAL_COMMUNITY)
Admission: RE | Admit: 2019-02-15 | Discharge: 2019-02-15 | Disposition: A | Discharge status: Home / Self Care | Payer: Medicaid Other | Source: Ambulatory Visit | Attending: General Surgery | Admitting: General Surgery

## 2019-02-15 DIAGNOSIS — Z01812 Encounter for preprocedural laboratory examination: Secondary | ICD-10-CM | POA: Diagnosis not present

## 2019-02-15 DIAGNOSIS — Z20828 Contact with and (suspected) exposure to other viral communicable diseases: Secondary | ICD-10-CM | POA: Diagnosis not present

## 2019-02-15 LAB — HCG, SERUM, QUALITATIVE: Preg, Serum: NEGATIVE

## 2019-02-15 LAB — SARS CORONAVIRUS 2 (TAT 6-24 HRS): SARS Coronavirus 2: NEGATIVE

## 2019-02-17 ENCOUNTER — Encounter (HOSPITAL_COMMUNITY)
Admission: RE | Disposition: A | Discharge status: Home / Self Care | Payer: Self-pay | Source: Home / Self Care | Attending: General Surgery

## 2019-02-17 ENCOUNTER — Ambulatory Visit (HOSPITAL_COMMUNITY): Payer: Medicaid Other | Admitting: Anesthesiology

## 2019-02-17 ENCOUNTER — Ambulatory Visit (HOSPITAL_COMMUNITY)
Admission: RE | Admit: 2019-02-17 | Discharge: 2019-02-17 | Disposition: A | Discharge status: Home / Self Care | Payer: Medicaid Other | Attending: General Surgery | Admitting: General Surgery

## 2019-02-17 ENCOUNTER — Encounter (HOSPITAL_COMMUNITY): Payer: Self-pay | Admitting: *Deleted

## 2019-02-17 DIAGNOSIS — F329 Major depressive disorder, single episode, unspecified: Secondary | ICD-10-CM | POA: Insufficient documentation

## 2019-02-17 DIAGNOSIS — Z818 Family history of other mental and behavioral disorders: Secondary | ICD-10-CM | POA: Insufficient documentation

## 2019-02-17 DIAGNOSIS — Z87891 Personal history of nicotine dependence: Secondary | ICD-10-CM | POA: Diagnosis not present

## 2019-02-17 DIAGNOSIS — Z91018 Allergy to other foods: Secondary | ICD-10-CM | POA: Diagnosis not present

## 2019-02-17 DIAGNOSIS — F419 Anxiety disorder, unspecified: Secondary | ICD-10-CM | POA: Insufficient documentation

## 2019-02-17 DIAGNOSIS — Z882 Allergy status to sulfonamides status: Secondary | ICD-10-CM | POA: Insufficient documentation

## 2019-02-17 DIAGNOSIS — D171 Benign lipomatous neoplasm of skin and subcutaneous tissue of trunk: Secondary | ICD-10-CM | POA: Diagnosis present

## 2019-02-17 DIAGNOSIS — Z825 Family history of asthma and other chronic lower respiratory diseases: Secondary | ICD-10-CM | POA: Diagnosis not present

## 2019-02-17 HISTORY — PX: LIPOMA EXCISION: SHX5283

## 2019-02-17 SURGERY — EXCISION LIPOMA
Anesthesia: General | Site: Back

## 2019-02-17 MED ORDER — LABETALOL HCL 5 MG/ML IV SOLN
INTRAVENOUS | Status: AC
  Filled 2019-02-17: qty 4

## 2019-02-17 MED ORDER — LIDOCAINE HCL (PF) 1 % IJ SOLN
INTRAMUSCULAR | Status: AC
  Filled 2019-02-17: qty 30

## 2019-02-17 MED ORDER — ONDANSETRON HCL 4 MG/2ML IJ SOLN
INTRAMUSCULAR | Status: DC | PRN
  Administered 2019-02-17: 4 mg via INTRAVENOUS

## 2019-02-17 MED ORDER — MIDAZOLAM HCL 2 MG/2ML IJ SOLN: 0.5000 mg | Freq: Once | INTRAMUSCULAR | Status: DC | PRN

## 2019-02-17 MED ORDER — LACTATED RINGERS IV SOLN
INTRAVENOUS | Status: DC
  Administered 2019-02-17: 07:00:00 via INTRAVENOUS

## 2019-02-17 MED ORDER — LIDOCAINE HCL (CARDIAC) PF 100 MG/5ML IV SOSY
PREFILLED_SYRINGE | INTRAVENOUS | Status: DC | PRN
  Administered 2019-02-17: 50 mg via INTRAVENOUS

## 2019-02-17 MED ORDER — PROMETHAZINE HCL 25 MG/ML IJ SOLN: 6.2500 mg | INTRAMUSCULAR | Status: DC | PRN

## 2019-02-17 MED ORDER — GLYCOPYRROLATE 0.2 MG/ML IJ SOLN
INTRAMUSCULAR | Status: DC | PRN
  Administered 2019-02-17: 0.2 mg via INTRAVENOUS

## 2019-02-17 MED ORDER — LIDOCAINE HCL (PF) 1 % IJ SOLN
INTRAMUSCULAR | Status: DC | PRN
  Administered 2019-02-17: 8 mL

## 2019-02-17 MED ORDER — DEXAMETHASONE SODIUM PHOSPHATE 10 MG/ML IJ SOLN
INTRAMUSCULAR | Status: AC
  Filled 2019-02-17: qty 1

## 2019-02-17 MED ORDER — PROPOFOL 10 MG/ML IV BOLUS
INTRAVENOUS | Status: DC | PRN
  Administered 2019-02-17: 200 mg via INTRAVENOUS

## 2019-02-17 MED ORDER — PROPOFOL 10 MG/ML IV BOLUS
INTRAVENOUS | Status: AC
  Filled 2019-02-17: qty 20

## 2019-02-17 MED ORDER — FENTANYL CITRATE (PF) 100 MCG/2ML IJ SOLN
INTRAMUSCULAR | Status: AC
  Filled 2019-02-17: qty 2

## 2019-02-17 MED ORDER — DEXAMETHASONE SODIUM PHOSPHATE 4 MG/ML IJ SOLN
INTRAMUSCULAR | Status: DC | PRN
  Administered 2019-02-17: 5 mg via INTRAVENOUS

## 2019-02-17 MED ORDER — CHLORHEXIDINE GLUCONATE CLOTH 2 % EX PADS: 6.0000 | MEDICATED_PAD | Freq: Once | CUTANEOUS | Status: DC

## 2019-02-17 MED ORDER — PHENYLEPHRINE HCL (PRESSORS) 10 MG/ML IV SOLN
INTRAVENOUS | Status: DC | PRN
  Administered 2019-02-17 (×4): 100 ug via INTRAVENOUS

## 2019-02-17 MED ORDER — ONDANSETRON HCL 4 MG/2ML IJ SOLN
INTRAMUSCULAR | Status: AC
  Filled 2019-02-17: qty 2

## 2019-02-17 MED ORDER — SODIUM CHLORIDE 0.9 % IR SOLN
Status: DC | PRN
  Administered 2019-02-17: 1000 mL

## 2019-02-17 MED ORDER — MIDAZOLAM HCL 5 MG/5ML IJ SOLN
INTRAMUSCULAR | Status: DC | PRN
  Administered 2019-02-17: 2 mg via INTRAVENOUS

## 2019-02-17 MED ORDER — DOCUSATE SODIUM 100 MG PO CAPS: 100.0000 mg | ORAL_CAPSULE | Freq: Two times a day (BID) | ORAL | 0 refills | Status: DC

## 2019-02-17 MED ORDER — LIDOCAINE 2% (20 MG/ML) 5 ML SYRINGE
INTRAMUSCULAR | Status: AC
  Filled 2019-02-17: qty 5

## 2019-02-17 MED ORDER — MIDAZOLAM HCL 2 MG/2ML IJ SOLN
INTRAMUSCULAR | Status: AC
  Filled 2019-02-17: qty 2

## 2019-02-17 MED ORDER — OXYCODONE HCL 5 MG PO TABS: 5.0000 mg | ORAL_TABLET | ORAL | 0 refills | Status: DC | PRN

## 2019-02-17 MED ORDER — CEFAZOLIN SODIUM-DEXTROSE 2-4 GM/100ML-% IV SOLN
2.0000 g | INTRAVENOUS | Status: AC
  Administered 2019-02-17: 2 g via INTRAVENOUS
  Filled 2019-02-17: qty 100

## 2019-02-17 MED ORDER — BUPIVACAINE HCL (PF) 0.5 % IJ SOLN
INTRAMUSCULAR | Status: AC
  Filled 2019-02-17: qty 30

## 2019-02-17 MED ORDER — HYDROCODONE-ACETAMINOPHEN 7.5-325 MG PO TABS
1.0000 | ORAL_TABLET | Freq: Once | ORAL | Status: AC | PRN
  Administered 2019-02-17: 10:00:00 1 via ORAL
  Filled 2019-02-17: qty 1

## 2019-02-17 MED ORDER — HYDROMORPHONE HCL 1 MG/ML IJ SOLN: 0.2500 mg | INTRAMUSCULAR | Status: DC | PRN

## 2019-02-17 MED ORDER — PHENYLEPHRINE 40 MCG/ML (10ML) SYRINGE FOR IV PUSH (FOR BLOOD PRESSURE SUPPORT)
PREFILLED_SYRINGE | INTRAVENOUS | Status: AC
  Filled 2019-02-17: qty 10

## 2019-02-17 MED ORDER — FENTANYL CITRATE (PF) 100 MCG/2ML IJ SOLN
INTRAMUSCULAR | Status: DC | PRN
  Administered 2019-02-17: 100 ug via INTRAVENOUS

## 2019-02-17 SURGICAL SUPPLY — 40 items
APL PRP STRL LF DISP 70% ISPRP (MISCELLANEOUS) ×1
CHLORAPREP W/TINT 26 (MISCELLANEOUS) ×3 IMPLANT
CLOTH BEACON ORANGE TIMEOUT ST (SAFETY) ×3 IMPLANT
COVER LIGHT HANDLE STERIS (MISCELLANEOUS) ×6 IMPLANT
DECANTER SPIKE VIAL GLASS SM (MISCELLANEOUS) ×2 IMPLANT
DRSG TEGADERM 4X4.75 (GAUZE/BANDAGES/DRESSINGS) ×3 IMPLANT
ELECT NDL TIP 2.8 STRL (NEEDLE) IMPLANT
ELECT NEEDLE TIP 2.8 STRL (NEEDLE) ×3 IMPLANT
ELECT REM PT RETURN 9FT ADLT (ELECTROSURGICAL) ×3
ELECTRODE REM PT RTRN 9FT ADLT (ELECTROSURGICAL) ×1 IMPLANT
GAUZE SPONGE 2X2 8PLY STRL LF (GAUZE/BANDAGES/DRESSINGS) ×2 IMPLANT
GLOVE BIO SURGEON STRL SZ 6.5 (GLOVE) ×2 IMPLANT
GLOVE BIO SURGEON STRL SZ7 (GLOVE) ×2 IMPLANT
GLOVE BIO SURGEONS STRL SZ 6.5 (GLOVE) ×1
GLOVE BIOGEL PI IND STRL 6.5 (GLOVE) ×1 IMPLANT
GLOVE BIOGEL PI IND STRL 7.0 (GLOVE) ×1 IMPLANT
GLOVE BIOGEL PI IND STRL 7.5 (GLOVE) IMPLANT
GLOVE BIOGEL PI INDICATOR 6.5 (GLOVE) ×2
GLOVE BIOGEL PI INDICATOR 7.0 (GLOVE) ×6
GLOVE BIOGEL PI INDICATOR 7.5 (GLOVE) ×2
GLOVE ECLIPSE 6.5 STRL STRAW (GLOVE) ×4 IMPLANT
GOWN STRL REUS W/ TWL LRG LVL3 (GOWN DISPOSABLE) IMPLANT
GOWN STRL REUS W/TWL LRG LVL3 (GOWN DISPOSABLE) ×9 IMPLANT
KIT TURNOVER KIT A (KITS) ×3 IMPLANT
MANIFOLD NEPTUNE II (INSTRUMENTS) ×3 IMPLANT
NDL HYPO 25X1 1.5 SAFETY (NEEDLE) ×1 IMPLANT
NEEDLE HYPO 25X1 1.5 SAFETY (NEEDLE) ×3 IMPLANT
NS IRRIG 1000ML POUR BTL (IV SOLUTION) ×3 IMPLANT
PACK MINOR (CUSTOM PROCEDURE TRAY) ×3 IMPLANT
PAD ARMBOARD 7.5X6 YLW CONV (MISCELLANEOUS) ×3 IMPLANT
PENCIL HANDSWITCHING (ELECTRODE) ×2 IMPLANT
SET BASIN LINEN APH (SET/KITS/TRAYS/PACK) ×3 IMPLANT
SPONGE GAUZE 2X2 8PLY STER LF (GAUZE/BANDAGES/DRESSINGS) ×2
SPONGE GAUZE 2X2 8PLY STRL LF (GAUZE/BANDAGES/DRESSINGS) ×2 IMPLANT
SPONGE GAUZE 2X2 STER 10/PKG (GAUZE/BANDAGES/DRESSINGS) ×4
SUT ETHILON 3 0 FSL (SUTURE) ×2 IMPLANT
SUT VIC AB 3-0 SH 27 (SUTURE) ×3
SUT VIC AB 3-0 SH 27X BRD (SUTURE) ×1 IMPLANT
SYR BULB IRRIGATION 50ML (SYRINGE) ×3 IMPLANT
SYR CONTROL 10ML LL (SYRINGE) ×3 IMPLANT

## 2019-02-17 NOTE — Transfer of Care (Signed)
Immediate Anesthesia Transfer of Care Note  Patient: Kimberly Olsen  Procedure(s) Performed: EXCISION 1.5CM LIPOMA BACK (N/A Back)  Patient Location: PACU  Anesthesia Type:General  Level of Consciousness: awake, alert , oriented and patient cooperative  Airway & Oxygen Therapy: Patient Spontanous Breathing  Post-op Assessment: Report given to RN, Post -op Vital signs reviewed and stable and Patient moving all extremities X 4  Post vital signs: Reviewed and stable  Last Vitals:  Vitals Value Taken Time  BP 121/79 02/17/19 0848  Temp    Pulse 74 02/17/19 0850  Resp 15 02/17/19 0850  SpO2 98 % 02/17/19 0850  Vitals shown include unvalidated device data.  Last Pain:  Vitals:   02/17/19 0653  TempSrc: Oral  PainSc: 3       Patients Stated Pain Goal: 7 ([id number])  Complications: No apparent anesthesia complications

## 2019-02-17 NOTE — Anesthesia Preprocedure Evaluation (Signed)
Anesthesia Evaluation  Patient identified by MRN, date of birth, ID band Patient awake    Reviewed: Allergy & Precautions, NPO status , Patient's Chart, lab work & pertinent test results  Airway Mallampati: I  TM Distance: >3 FB Neck ROM: Full    Dental no notable dental hx. (+) Teeth Intact   Pulmonary neg pulmonary ROS, former smoker,    Pulmonary exam normal breath sounds clear to auscultation       Cardiovascular Exercise Tolerance: Good negative cardio ROS Normal cardiovascular examI Rhythm:Regular Rate:Normal     Neuro/Psych Anxiety Depression negative neurological ROS  negative psych ROS   GI/Hepatic negative GI ROS, Neg liver ROS,   Endo/Other  negative endocrine ROSneg diabetes  Renal/GU negative Renal ROS  negative genitourinary   Musculoskeletal negative musculoskeletal ROS (+)   Abdominal   Peds negative pediatric ROS (+)  Hematology negative hematology ROS (+)   Anesthesia Other Findings   Reproductive/Obstetrics negative OB ROS                             Anesthesia Physical Anesthesia Plan  ASA: II  Anesthesia Plan: General   Post-op Pain Management:    Induction: Intravenous  PONV Risk Score and Plan: Ondansetron, Dexamethasone, Midazolam and Treatment may vary due to age or medical condition  Airway Management Planned: LMA  Additional Equipment:   Intra-op Plan:   Post-operative Plan:   Informed Consent: I have reviewed the patients History and Physical, chart, labs and discussed the procedure including the risks, benefits and alternatives for the proposed anesthesia with the patient or authorized representative who has indicated his/her understanding and acceptance.     Dental advisory given  Plan Discussed with: CRNA  Anesthesia Plan Comments: (Plan Full PPE use  Plan GA (LMA) as tolerated with GETA as needed d/w pt -WTP with same after Q&A  Plan  lazy lateral, R side down )        Anesthesia Quick Evaluation

## 2019-02-17 NOTE — Progress Notes (Signed)
[facility]  Patient's fiance [name] notified that surgery is complete. Lipomatous lesion removed. Notified that Rx sent to pharmacy.  Discussed that this may not resolve her chronic back pain.   Curlene Labrum, MD [facility] [address] [place], [place] [address] [phone] (office)

## 2019-02-17 NOTE — Op Note (Signed)
[facility] Operative Note  02/17/19  Preoperative Diagnosis:  Back Lipoma    Postoperative Diagnosis: Same   Procedure(s) Performed: Excision of back lipoma 4cm    Surgeon: Lanell Matar. Constance Haw, MD   Assistants: No qualified resident was available    Anesthesia: General endotracheal   Anesthesiologist: Lenice Llamas, MD    Specimens:  Lipomatous tissue    Estimated Blood Loss: Minimal   Blood Replacement: None    Complications: None   Wound Class: Clean    Operative Indications: Ms. Kimmey is a 28 yo with some back pain and a subcutaneous mass that is likely a lipoma that she feels is contributing to her pain.  We discussed that this may not be causing all of her pain but discussed that this could be removed. We discussed the risk of bleeding, infection, recurrence, and she opted to proceed.   Findings: Lipomatous tissue with finger projections; overall more globular fat tissue    Procedure: The patient was taken to the operating room and placed supine. General LMA anesthesia was induced. Intravenous antibiotics were administered per protocol.  She was then rolled to right lateral decubitus position with all pressure points padded. The left back was prepared and draped in the usual sterile fashion.   The area was palpated where is complained of the lesion, and there was a small rolling lesion that could be felt.  This had been marked.  I made a vertical incision over the area, and carried this down into the subcutaneous tissue with cautery. The first adipose tissue I encountered was not obviously globular, and I attempted to through this in order to palpate any lipomatous lesion. The rolling lesion that was felt continued to be difficult to narrow in on, and I ultimately resected a small sheet 1.5X1.5cm of normal appearing fat overlying the rolling area to better facilitate excising the lipoma. Once this was performed, the lipomatous lesion became more obvious.   A globular lipomatous lesion was elevated out of the cavity, and multiple finger like projections were dissected out.  This area was in a string like organization going down deeper but still superficial to the muscle and overlying the top portion of the sacrum.  This was dissected out in its entirety and was about 4cm in length. On inspection of the cavity, the rolling lesion that had been previously palpated was no longer present, but there was more globular tissue cephalad.  This was not palpable on the skin and felt like a normal sheet of fat.  This was not resected as I did not want to take what is likely normal fat.  The cavity was irrigated. The deep space was attempted to be closed down the a 3-0 Vicryl and the skin was closed with interrupted 3-0 Nylon. A sterile gauze and Tegaderm were placed.   Final inspection revealed acceptable hemostasis. All counts were correct at the end of the case. The patient was awakened from anesthesia and extubated without complication.  The patient went to the PACU in stable condition.   Curlene Labrum, MD [facility] [address] [place], [place] [address] [phone] (office)

## 2019-02-17 NOTE — Anesthesia Postprocedure Evaluation (Signed)
Anesthesia Post Note  Patient: Kimberly Olsen  Procedure(s) Performed: EXCISION 1.5CM LIPOMA BACK (N/A Back)  Patient location during evaluation: PACU Anesthesia Type: General Level of consciousness: awake, awake and alert and oriented Pain management: pain level controlled Vital Signs Assessment: post-procedure vital signs reviewed and stable Respiratory status: spontaneous breathing, respiratory function stable and nonlabored ventilation Cardiovascular status: stable Postop Assessment: no apparent nausea or vomiting Anesthetic complications: no     Last Vitals:  Vitals:   02/17/19 0653  BP: 108/69  Pulse: 68  Resp: 18  Temp: 36.7 C  SpO2: 95%    Last Pain:  Vitals:   02/17/19 0653  TempSrc: Oral  PainSc: 3                  Blayne Garlick

## 2019-02-17 NOTE — Interval H&P Note (Signed)
History and Physical Interval Note:  02/17/2019 7:16 AM  Kimberly Olsen  has presented today for surgery, with the diagnosis of 1.5cm lipoma, back.  The various methods of treatment have been discussed with the patient and family. After consideration of risks, benefits and other options for treatment, the patient has consented to  Procedure(s): EXCISION 1.5CM LIPOMA BACK (N/A) as a surgical intervention.  The patient's history has been reviewed, patient examined, no change in status, stable for surgery.  I have reviewed the patient's chart and labs.  Questions were answered to the patient's satisfaction.    No changes. Area marked.  Virl Cagey

## 2019-02-17 NOTE — Anesthesia Procedure Notes (Signed)
Procedure Name: LMA Insertion Date/Time: 02/17/2019 7:40 AM Performed by: Jonna Munro, CRNA Pre-anesthesia Checklist: Patient identified, Emergency Drugs available, Suction available, Patient being monitored and Timeout performed Patient Re-evaluated:Patient Re-evaluated prior to induction Oxygen Delivery Method: Circle system utilized Preoxygenation: Pre-oxygenation with 100% oxygen Induction Type: IV induction LMA: LMA inserted LMA Size: 4.0 Number of attempts: 1 Placement Confirmation: positive ETCO2 and breath sounds checked- equal and bilateral Tube secured with: Tape Dental Injury: Teeth and Oropharynx as per pre-operative assessment

## 2019-02-17 NOTE — Discharge Instructions (Signed)
Discharge Instructions:  Pain and bruising/ swelling at the incision is common. You can place an ice pack over the area as needed.  Some drainage is normal over the first few days. This will improve. The drainage can be blood tinged/ pink. Leave the dressing on for 48 hours (you may shower with it on). Change the dressing if the dressing becomes s saturated.  After 48 hours you can remove it, and cover the area as desired.  You may shower but do not submerge in water.  Activity as tolerated. Limit activity that pulls or stretches the skin in that area.  Do not place lotions or balms on your incision unless instructed to specifically by Dr. Constance Haw.  The removal of the lipoma may not help your chronic back pain. You may form a seroma (fluid pocket) in the cavity where the lipoma was removed. This will feel like a fluid filled pocket. If this forms we will watch it, and if needed we can remove the fluid in the office.  It is fine to return to work next week, but let us know if you need more time, call the office.    Medication: Take tylenol and ibuprofen as needed for pain control, alternating every 4-6 hours.  Example:  Tylenol 1000mg  @ 6am, 12noon, 6pm, 70midnight (Do not exceed 4000mg  of tylenol a day). Ibuprofen 800mg  @ 9am, 3pm, 9pm, 3am (Do not exceed 3600mg  of ibuprofen a day).  Take Roxicodone for breakthrough pain every 4 hours.  Take Colace for constipation related to narcotic pain medication. If you do not have a bowel movement in 2 days, take Miralax over the counter.  Drink plenty of water to also prevent constipation.   Contact Information: If you have questions or concerns, please call our office, [phone], Monday- Thursday 8AM-5PM and Friday 8AM-12Noon.  If it is after hours or on the weekend, please call [facility], [phone], and ask to speak to the surgeon on call for Dr. Constance Haw at [facility].    Lipoma Removal, Care After Refer to this sheet in the next  few weeks. These instructions provide you with information about caring for yourself after your procedure. Your health care provider may also give you more specific instructions. Your treatment has been planned according to current medical practices, but problems sometimes occur. Call your health care provider if you have any problems or questions after your procedure. What can I expect after the procedure? After the procedure, it is common to have:  Mild pain.  Swelling.  Bruising. Follow these instructions at home:  Bathing  Do not take baths, swim, or use a hot tub until your health care provider approves.   You may shower.  Keep your bandage (dressing) dry until your health care provider says it can be removed. Incision care   Follow instructions from your health care provider about how to take care of your incision. Make sure you: ? Wash your hands with soap and water before you change your bandage (dressing). If soap and water are not available, use hand sanitizer. ? Change your dressing as told by your health care provider. ? Leave stitches (sutures), skin glue, or adhesive strips in place. These skin closures may need to stay in place for 2 weeks or longer. If adhesive strip edges start to loosen and curl up, you may trim the loose edges. Do not remove adhesive strips completely unless your health care provider tells you to do that.  Check your incision area every  day for signs of infection. Check for: ? More redness, swelling, or pain. ? Fluid or blood. ? Warmth. ? Pus or a bad smell. Driving  Do not drive or operate heavy machinery while taking prescription pain medicine.  Do not drive for 24 hours if you received a medicine to help you relax (sedative) during your procedure.  You may drive when you are not taking pain medication and when you can slam on the breaks and turn the wheel easily.  General instructions  Take over-the-counter and prescription medicines only  as told by your health care provider.  Do not use any tobacco products, such as cigarettes, chewing tobacco, and e-cigarettes. These can delay healing. If you need help quitting, ask your health care provider.  Return to your normal activities as told by your health care provider. Ask your health care provider what activities are safe for you.  Keep all follow-up visits as told by your health care provider. This is important. Contact a health care provider if:  You have more redness, swelling, or pain around your incision.  You have fluid or blood coming from your incision.  Your incision feels warm to the touch.  You have pus or a bad smell coming from your incision.  You have pain that does not get better with medicine. Get help right away if:  You have chills or a fever.  You have severe pain. This information is not intended to replace advice given to you by your health care provider. Make sure you discuss any questions you have with your health care provider. Document Released: 05/18/2015 Document Revised: 10/26/2015 Document Reviewed: 05/18/2015 Elsevier Patient Education  2020 [employer].

## 2019-02-18 ENCOUNTER — Encounter (HOSPITAL_COMMUNITY): Payer: Self-pay | Admitting: General Surgery

## 2019-02-18 LAB — SURGICAL PATHOLOGY

## 2019-03-04 ENCOUNTER — Ambulatory Visit: Payer: Self-pay | Admitting: General Surgery

## 2019-03-09 ENCOUNTER — Encounter: Payer: Self-pay | Admitting: General Surgery

## 2019-03-09 ENCOUNTER — Ambulatory Visit (INDEPENDENT_AMBULATORY_CARE_PROVIDER_SITE_OTHER): Payer: Self-pay | Admitting: General Surgery

## 2019-03-09 ENCOUNTER — Other Ambulatory Visit: Payer: Self-pay

## 2019-03-09 VITALS — BP 106/73 | HR 71 | Temp 98.1°F | Resp 16 | Ht 67.5 in | Wt 195.0 lb

## 2019-03-09 DIAGNOSIS — D171 Benign lipomatous neoplasm of skin and subcutaneous tissue of trunk: Secondary | ICD-10-CM

## 2019-03-09 NOTE — Progress Notes (Signed)
[facility] Note   HPI:  28 y.o. Female presents to clinic for post-op follow-up evaluation of her back wound. Patient reports she is doing well and has had relief of her back soreness.  Review of Systems:  No drainage or redness Notices area on right back in the same location All other review of systems: otherwise negative   Pathology: FINAL MICROSCOPIC DIAGNOSIS:   A. LIPOMA, LEFT BACK, EXCISION:  - Mature adipose tissue consistent with lipoma   Vital Signs:  BP 106/73 (BP Location: Left Arm, Patient Position: Sitting, Cuff Size: Normal)   Pulse 71   Temp 98.1 F (36.7 C) (Oral)   Resp 16   Ht 5' 7.5" (1.715 m)   Wt 195 lb (88.5 kg)   SpO2 95%   BMI 30.09 kg/m    Physical Exam:  Physical Exam Vitals reviewed.  Constitutional:      Appearance: Normal appearance.  HENT:     Head: Normocephalic.     Nose: Nose normal.  Pulmonary:     Effort: Pulmonary effort is normal.     Breath sounds: Normal breath sounds.  Musculoskeletal:     Comments: Left back incisions with sutures, c/d/i without erythema or drainage, healed, sutures removed, steri strips placed; right lower back, small 1cm mobile area over sacrum similar to prior lipoma on the left  Neurological:     Mental Status: She is alert.      Assessment:  28 y.o. yo Female s/p lipoma excision. Doing well. Healed. Feels one on the right but this is small.  Plan:  - Can monitor lipoma on the right, if having issues can come and talk about removal   - PRN follow up - Steri strip will fall off in a few days   All of the above recommendations were discussed with the patient, and all of patient's questions were answered to her expressed satisfaction.  Curlene Labrum, MD [facility] [address] [place], [place] [address] [phone] (office)

## 2019-04-12 ENCOUNTER — Ambulatory Visit: Payer: Medicaid Other | Attending: Internal Medicine

## 2019-04-12 ENCOUNTER — Emergency Department (HOSPITAL_COMMUNITY)
Admission: EM | Admit: 2019-04-12 | Discharge: 2019-04-12 | Disposition: A | Discharge status: Home / Self Care | Payer: Medicaid Other | Attending: Emergency Medicine | Admitting: Emergency Medicine

## 2019-04-12 ENCOUNTER — Ambulatory Visit: Payer: Medicaid Other | Admitting: Advanced Practice Midwife

## 2019-04-12 ENCOUNTER — Encounter (HOSPITAL_COMMUNITY): Payer: Self-pay | Admitting: *Deleted

## 2019-04-12 ENCOUNTER — Emergency Department (HOSPITAL_COMMUNITY): Payer: Medicaid Other

## 2019-04-12 ENCOUNTER — Other Ambulatory Visit: Payer: Self-pay

## 2019-04-12 DIAGNOSIS — Z87891 Personal history of nicotine dependence: Secondary | ICD-10-CM | POA: Insufficient documentation

## 2019-04-12 DIAGNOSIS — Z79899 Other long term (current) drug therapy: Secondary | ICD-10-CM | POA: Diagnosis not present

## 2019-04-12 DIAGNOSIS — R1030 Lower abdominal pain, unspecified: Secondary | ICD-10-CM | POA: Insufficient documentation

## 2019-04-12 DIAGNOSIS — N939 Abnormal uterine and vaginal bleeding, unspecified: Secondary | ICD-10-CM | POA: Insufficient documentation

## 2019-04-12 DIAGNOSIS — R102 Pelvic and perineal pain: Secondary | ICD-10-CM | POA: Insufficient documentation

## 2019-04-12 DIAGNOSIS — Z20822 Contact with and (suspected) exposure to covid-19: Secondary | ICD-10-CM

## 2019-04-12 LAB — URINALYSIS, ROUTINE W REFLEX MICROSCOPIC
Bacteria, UA: NONE SEEN
Bilirubin Urine: NEGATIVE
Glucose, UA: NEGATIVE mg/dL
Ketones, ur: NEGATIVE mg/dL
Leukocytes,Ua: NEGATIVE
Nitrite: NEGATIVE
Protein, ur: NEGATIVE mg/dL
RBC / HPF: >50 RBC/hpf — ABNORMAL HIGH (ref 0–5)
Specific Gravity, Urine: 1.016 (ref 1.005–1.030)
pH: 5 (ref 5.0–8.0)

## 2019-04-12 LAB — WET PREP, GENITAL
Clue Cells Wet Prep HPF POC: NONE SEEN
Sperm: NONE SEEN
Trich, Wet Prep: NONE SEEN
Yeast Wet Prep HPF POC: NONE SEEN

## 2019-04-12 LAB — CBC WITH DIFFERENTIAL/PLATELET
Abs Immature Granulocytes: 0.04 10*3/uL (ref 0.00–0.07)
Basophils Absolute: 0.1 10*3/uL (ref 0.0–0.1)
Basophils Relative: 1 %
Eosinophils Absolute: 0.5 10*3/uL (ref 0.0–0.5)
Eosinophils Relative: 4 %
HCT: 39.1 % (ref 36.0–46.0)
Hemoglobin: 12.4 g/dL (ref 12.0–15.0)
Immature Granulocytes: 0 %
Lymphocytes Relative: 33 %
Lymphs Abs: 3.6 10*3/uL (ref 0.7–4.0)
MCH: 28.1 pg (ref 26.0–34.0)
MCHC: 31.7 g/dL (ref 30.0–36.0)
MCV: 88.5 fL (ref 80.0–100.0)
Monocytes Absolute: 0.6 10*3/uL (ref 0.1–1.0)
Monocytes Relative: 5 %
Neutro Abs: 6 10*3/uL (ref 1.7–7.7)
Neutrophils Relative %: 57 %
Platelets: 343 10*3/uL (ref 150–400)
RBC: 4.42 MIL/uL (ref 3.87–5.11)
RDW: 13.8 % (ref 11.5–15.5)
WBC: 10.7 10*3/uL — ABNORMAL HIGH (ref 4.0–10.5)
nRBC: 0 % (ref 0.0–0.2)

## 2019-04-12 LAB — I-STAT BETA HCG BLOOD, ED (MC, WL, AP ONLY): I-stat hCG, quantitative: <5 m[IU]/mL (ref <5)

## 2019-04-12 MED ORDER — KETOROLAC TROMETHAMINE 30 MG/ML IJ SOLN: 30.0000 mg | Freq: Once | INTRAMUSCULAR | Status: DC

## 2019-04-12 NOTE — ED Notes (Signed)
Urine obtained  To lab

## 2019-04-12 NOTE — ED Notes (Signed)
Kimberly Olsen, with lab on way to collect labwork

## 2019-04-12 NOTE — ED Triage Notes (Signed)
States she has had 3 episodes of vaginal bleeding in the past month

## 2019-04-12 NOTE — ED Provider Notes (Signed)
[facility] EMERGENCY DEPARTMENT Provider Note   CSN: [id number] Arrival date & time: 04/12/19  1527     History Chief Complaint  Patient presents with  . Metrorrhagia    Kimberly Olsen is a 29 y.o. female with history of anemia, anxiety, depression, gestational diabetes presenting for evaluation of acute onset, progressively worsening vaginal bleeding.  She reports that she began menstruating at the end of December which lasted approximately 1 week and came on around the time that she was expecting to start menstruating.  She says it essentially was a normal period for her.  She notes that a week or 2 later she experienced a few days of light vaginal spotting which then resolved.  4 days ago she developed vaginal bleeding again which has since become heavier.  She states that it is worse than her usual.  She has been saturating 1 pad every hour over the last day or 2.  She notes lower abdominal pain, no aggravating or alleviating factors noted.  She denies any nausea, vomiting, diarrhea, constipation, or urinary symptoms.  She states she has a low suspicion of STDs.  She is status post tubal ligation, does not use any hormonal birth control.  She is also status post left oophorectomy secondary to a large ovarian cyst.  She also notes that she has been feeling generally unwell with viral type symptoms over the last week or so and is pending the results of a Covid test which was obtained today.  The history is provided by the patient.       Past Medical History:  Diagnosis Date  . Anemia   . Anxiety   . Depression     Patient Active Problem List   Diagnosis Date Noted  . Lipoma of back 01/21/2019  . Normal labor 03/06/2018  . Supervision of high risk pregnancy, antepartum 08/08/2017  . Depression with anxiety 08/08/2017  . History of gestational diabetes 08/08/2017  . GDM (gestational diabetes mellitus), class A1 08/17/2014  . Marijuana use 01/19/2014    Past Surgical History:    Procedure Laterality Date  . LIPOMA EXCISION N/A 02/17/2019   Procedure: EXCISION 1.5CM LIPOMA BACK;  Surgeon: Virl Cagey, MD;  Location: [facility];  Service: General;  Laterality: N/A;  . OOPHORECTOMY Left   . TUBAL LIGATION Bilateral 03/07/2018   Procedure: POST PARTUM TUBAL LIGATION;  Surgeon: Chancy Milroy, MD;  Location: [facility];  Service: Gynecology;  Laterality: Bilateral;     OB History    Gravida  3   Para  3   Term  3   Preterm      AB      Living  3     SAB      TAB      Ectopic      Multiple  0   Live Births  3           Family History  Problem Relation Age of Onset  . Depression Mother   . Asthma Mother   . Mental illness Mother   . Other Mother        drug over dose  . Depression Father   . Mental illness Father   . Anesthesia problems Neg Hx   . Hypotension Neg Hx   . Malignant hyperthermia Neg Hx   . Pseudochol deficiency Neg Hx     Social History   Tobacco Use  . Smoking status: Former Smoker    Packs/day: 1.00  Years: 8.00    Pack years: 8.00    Types: Cigarettes  . Smokeless tobacco: Never Used  Substance Use Topics  . Alcohol use: No  . Drug use: Yes    Types: Marijuana    Comment: daily    Home Medications Prior to Admission medications   Medication Sig Start Date End Date Taking? Authorizing Provider  ibuprofen (ADVIL) 200 MG tablet Take 400 mg by mouth every 6 (six) hours as needed for moderate pain.   Yes [provider]  Multiple Vitamins-Minerals (MULTIVITAMIN WITH MINERALS) tablet Take 1 tablet by mouth daily.   Yes [provider]    Allergies    Coconut fatty acids and Sulfa antibiotics  Review of Systems   Review of Systems  Gastrointestinal: Positive for abdominal pain. Negative for constipation, diarrhea, nausea and vomiting.  Genitourinary: Positive for menstrual problem. Negative for dysuria, frequency, hematuria, urgency, vaginal discharge and vaginal pain.  All  other systems reviewed and are negative.   Physical Exam Updated Vital Signs BP 117/83   Pulse 78   Temp 98.4 F (36.9 C)   Resp 20   Ht 5' 7.5" (1.715 m)   Wt 88.5 kg   LMP 04/12/2019   SpO2 97%   BMI 30.09 kg/m   Physical Exam Vitals and nursing note reviewed.  Constitutional:      General: She is not in acute distress.    Appearance: She is well-developed.  HENT:     Head: Normocephalic and atraumatic.  Eyes:     General:        Right eye: No discharge.        Left eye: No discharge.     Conjunctiva/sclera: Conjunctivae normal.  Neck:     Vascular: No JVD.     Trachea: No tracheal deviation.  Cardiovascular:     Rate and Rhythm: Normal rate and regular rhythm.  Pulmonary:     Effort: Pulmonary effort is normal.     Breath sounds: Normal breath sounds.  Abdominal:     General: Bowel sounds are normal. There is no distension.     Palpations: Abdomen is soft.     Tenderness: There is abdominal tenderness in the right lower quadrant, suprapubic area and left lower quadrant. There is no right CVA tenderness or left CVA tenderness.  Genitourinary:    Comments: Examination performed in the presence of a chaperone.  Copious amount of vaginal bleeding noted in the vaginal vault.  No abnormal discharge noted.  No cervical motion tenderness but she does have some mild right adnexal tenderness. Skin:    General: Skin is warm and dry.     Findings: No erythema.  Neurological:     Mental Status: She is alert.  Psychiatric:        Behavior: Behavior normal.     ED Results / Procedures / Treatments   Labs (all labs ordered are listed, but only abnormal results are displayed) Labs Reviewed  WET PREP, GENITAL - Abnormal; Notable for the following components:      Result Value   WBC, Wet Prep HPF POC FEW (*)    All other components within normal limits  CBC WITH DIFFERENTIAL/PLATELET - Abnormal; Notable for the following components:   WBC 10.7 (*)    All other  components within normal limits  URINALYSIS, ROUTINE W REFLEX MICROSCOPIC - Abnormal; Notable for the following components:   Color, Urine STRAW (*)    Hgb urine dipstick LARGE (*)  RBC / HPF >50 (*)    All other components within normal limits  I-STAT BETA HCG BLOOD, ED ([facility], [facility], [facility] ONLY)  GC/CHLAMYDIA PROBE AMP ([facility]) NOT AT [facility]    EKG None  Radiology US Transvaginal Non-OB  Result Date: 04/12/2019 CLINICAL DATA:  Right pelvic pain.  Prior left oophorectomy. EXAM: TRANSABDOMINAL AND TRANSVAGINAL ULTRASOUND OF PELVIS DOPPLER ULTRASOUND OF OVARIES TECHNIQUE: Both transabdominal and transvaginal ultrasound examinations of the pelvis were performed. Transabdominal technique was performed for global imaging of the pelvis including uterus, ovaries, adnexal regions, and pelvic cul-de-sac. It was necessary to proceed with endovaginal exam following the transabdominal exam to visualize the uterus, endometrium and right ovary. Color and duplex Doppler ultrasound was utilized to evaluate blood flow to the ovaries. COMPARISON:  None. FINDINGS: Uterus Measurements: 9.6 x 5.1 x 6.9 cm = volume: 176 mL. No fibroids or other mass visualized. Endometrium Thickness: 6 mm in thickness.  No focal abnormality visualized. Right ovary Measurements: 4.3 x 2.5 x 2.4 cm = volume: 13 mL. Normal appearance/no adnexal mass. Left ovary Measurements: Prior oophorectomy.  No adnexal mass. Pulsed Doppler evaluation of the right ovary demonstrates normal low-resistance arterial and venous waveforms. Other findings No abnormal free fluid. IMPRESSION: Unremarkable study. Prior left oophorectomy. Electronically Signed   By: Rolm Baptise M.D.   On: 04/12/2019 20:52   US Pelvis Complete  Result Date: 04/12/2019 CLINICAL DATA:  Right pelvic pain.  Prior left oophorectomy. EXAM: TRANSABDOMINAL AND TRANSVAGINAL ULTRASOUND OF PELVIS DOPPLER ULTRASOUND OF OVARIES TECHNIQUE: Both transabdominal and transvaginal ultrasound  examinations of the pelvis were performed. Transabdominal technique was performed for global imaging of the pelvis including uterus, ovaries, adnexal regions, and pelvic cul-de-sac. It was necessary to proceed with endovaginal exam following the transabdominal exam to visualize the uterus, endometrium and right ovary. Color and duplex Doppler ultrasound was utilized to evaluate blood flow to the ovaries. COMPARISON:  None. FINDINGS: Uterus Measurements: 9.6 x 5.1 x 6.9 cm = volume: 176 mL. No fibroids or other mass visualized. Endometrium Thickness: 6 mm in thickness.  No focal abnormality visualized. Right ovary Measurements: 4.3 x 2.5 x 2.4 cm = volume: 13 mL. Normal appearance/no adnexal mass. Left ovary Measurements: Prior oophorectomy.  No adnexal mass. Pulsed Doppler evaluation of the right ovary demonstrates normal low-resistance arterial and venous waveforms. Other findings No abnormal free fluid. IMPRESSION: Unremarkable study. Prior left oophorectomy. Electronically Signed   By: Rolm Baptise M.D.   On: 04/12/2019 20:52   [place] Art/Ven Flow Abd Pelv Doppler  Result Date: 04/12/2019 CLINICAL DATA:  Right pelvic pain.  Prior left oophorectomy. EXAM: TRANSABDOMINAL AND TRANSVAGINAL ULTRASOUND OF PELVIS DOPPLER ULTRASOUND OF OVARIES TECHNIQUE: Both transabdominal and transvaginal ultrasound examinations of the pelvis were performed. Transabdominal technique was performed for global imaging of the pelvis including uterus, ovaries, adnexal regions, and pelvic cul-de-sac. It was necessary to proceed with endovaginal exam following the transabdominal exam to visualize the uterus, endometrium and right ovary. Color and duplex Doppler ultrasound was utilized to evaluate blood flow to the ovaries. COMPARISON:  None. FINDINGS: Uterus Measurements: 9.6 x 5.1 x 6.9 cm = volume: 176 mL. No fibroids or other mass visualized. Endometrium Thickness: 6 mm in thickness.  No focal abnormality visualized. Right ovary  Measurements: 4.3 x 2.5 x 2.4 cm = volume: 13 mL. Normal appearance/no adnexal mass. Left ovary Measurements: Prior oophorectomy.  No adnexal mass. Pulsed Doppler evaluation of the right ovary demonstrates normal low-resistance arterial and venous waveforms. Other findings No abnormal  free fluid. IMPRESSION: Unremarkable study. Prior left oophorectomy. Electronically Signed   By: Rolm Baptise M.D.   On: 04/12/2019 20:52    Procedures Procedures (including critical care time)  Medications Ordered in ED Medications - No data to display  ED Course  I have reviewed the triage vital signs and the nursing notes.  Pertinent labs & imaging results that were available during my care of the patient were reviewed by me and considered in my medical decision making (see chart for details).    MDM Rules/Calculators/A&P                      Patient presenting for evaluation of abnormal vaginal bleeding, reports she has had vaginal bleeding intermittently 3 separate times in the last month.  She is afebrile, vital signs are stable.  She is nontoxic in appearance.  She does note concurrent URI type symptoms that have been ongoing for a couple of days and is currently being tested for Covid, obtain test outpatient earlier today.  She does not feel that her vaginal bleeding is related.  Her abdomen is soft, she has some lower abdominal tenderness.  She is status post left oophorectomy due to large ovarian cyst.  On exam, copious vaginal bleeding noted.  No evidence of PID on work-up today but she does have some mild right adnexal tenderness on examination.  No mass palpated.  No peritoneal signs noted on examination of the abdomen.  Lab work reviewed by me shows mild nonspecific leukocytosis, no anemia, no thrombocytopenia.  UA with gross blood likely contaminant from vaginal bleeding, does not suggest UTI.  She underwent pelvic ultrasound which shows no evidence of ovarian torsion or ectopic pregnancy and her  pregnancy test today is negative.  On reevaluation she is resting comfortably in no apparent distress.  Serial abdominal examinations remain benign.  Doubt acute surgical abdominal pathology including obstruction, perforation, appendicitis or cholecystitis in the absence of fever or peritoneal signs.  She does have an OB/GYN with whom she can follow-up outpatient.  She will call tomorrow to schedule follow-up.  Discussed strict ED return precautions. Patient verbalized understanding of and agreement with plan and is safe for discharge home at this time.  Final Clinical Impression(s) / ED Diagnoses Final diagnoses:  Abnormal vaginal bleeding  Lower abdominal pain    Rx / DC Orders ED Discharge Orders    None       Renita Papa, PA-C 04/13/19 1441    Fredia Sorrow, MD 04/13/19 1526

## 2019-04-12 NOTE — Discharge Instructions (Signed)
You can take 1 to 2 tablets of Tylenol (350mg -1000mg  depending on the dose) every 6 hours as needed for pain.  Do not exceed 4000 mg of Tylenol daily.  If your pain persists you can take a dose of ibuprofen in between doses of Tylenol.  I usually recommend 400 to 600 mg of ibuprofen every 6 hours.  Take this with food to avoid upset stomach issues.  Drink plenty of fluids and get plenty of rest.  Call your PCP/OB/GYN to schedule follow-up.  Return to the emergency department if any concerning signs or symptoms develop such as high fevers, persistent vomiting, worsening pain, or severe worsening bleeding causing lightheadedness or loss of consciousness.

## 2019-04-12 NOTE — ED Notes (Signed)
Spoke with [place], [place] and confirmed ok to hold off on IV access at this time. Also, hold Toradol at this time, if pain med needed- ok to given oral per PA.

## 2019-04-12 NOTE — ED Notes (Signed)
From US

## 2019-04-13 LAB — GC/CHLAMYDIA PROBE AMP (~~LOC~~) NOT AT ARMC
Chlamydia: NEGATIVE
Neisseria Gonorrhea: NEGATIVE

## 2019-04-13 LAB — NOVEL CORONAVIRUS, NAA: SARS-CoV-2, NAA: NOT DETECTED

## 2019-04-29 ENCOUNTER — Other Ambulatory Visit: Payer: Self-pay

## 2019-04-29 ENCOUNTER — Encounter: Payer: Self-pay | Admitting: General Surgery

## 2019-04-29 ENCOUNTER — Ambulatory Visit (INDEPENDENT_AMBULATORY_CARE_PROVIDER_SITE_OTHER): Payer: Medicaid Other | Admitting: General Surgery

## 2019-04-29 VITALS — BP 122/73 | HR 82 | Temp 98.4°F | Resp 14 | Ht 67.5 in | Wt 199.6 lb

## 2019-04-29 DIAGNOSIS — L7634 Postprocedural seroma of skin and subcutaneous tissue following other procedure: Secondary | ICD-10-CM

## 2019-05-01 ENCOUNTER — Encounter: Payer: Self-pay | Admitting: General Surgery

## 2019-05-01 DIAGNOSIS — L7634 Postprocedural seroma of skin and subcutaneous tissue following other procedure: Secondary | ICD-10-CM | POA: Insufficient documentation

## 2019-05-01 NOTE — Progress Notes (Signed)
[facility]   HPI:  29 y.o. Female presents to clinic for follow-up evaluation of her back. She noticed worsening pain and a bulge in the area where her lipoma was removed after sitting on a hard bench for several hours. She reports that she has issues with pain after sitting at this vanity set/ hard bench.   The pain and bulge worried her.   Review of Systems:  No fever or chills Back pain  Bulge?  All other review of systems: otherwise negative   Vital Signs:  BP 122/73   Pulse 82   Temp 98.4 F (36.9 C) (Oral)   Resp 14   Ht 5' 7.5" (1.715 m)   Wt 199 lb 9.6 oz (90.5 kg)   LMP 04/12/2019   SpO2 96%   BMI 30.80 kg/m    Physical Exam:  Physical Exam Vitals reviewed.  HENT:     Head: Normocephalic.  Cardiovascular:     Rate and Rhythm: Normal rate.  Pulmonary:     Effort: Pulmonary effort is normal.  Musculoskeletal:     Comments: Left back incision healed, no obvious palpable or visualized mass, [place] used to see a small < 3cm seroma cavity, minimally tender with palpation  Neurological:     Mental Status: She is alert.     Assessment:  29 y.o. yo Female with a post operative seroma which is not unexpected given the size of her lipoma. I see no bulge. I am unfortunately not sure why this was seen after this episode of sitting on the bench or if the pain is associated with this seroma or not.  I think more likely the strain on the back called attention to something that was already there.  Plan:  - Avoid sitting in awkward places  - Monitor area and call if concerned  - No surgical intervention indicated, this should continue to decrease in size   All of the above recommendations were discussed with the patient, and all of patient's questions were answered to her expressed satisfaction.  Curlene Labrum, MD [facility] [address] [place], [place] [address] [phone] (office)

## 2019-06-04 ENCOUNTER — Emergency Department (HOSPITAL_COMMUNITY)
Admission: EM | Admit: 2019-06-04 | Discharge: 2019-06-05 | Disposition: A | Discharge status: Other Acute Inpatient Hospital | Payer: Medicaid Other | Attending: Emergency Medicine | Admitting: Emergency Medicine

## 2019-06-04 ENCOUNTER — Encounter (HOSPITAL_COMMUNITY): Payer: Self-pay | Admitting: Emergency Medicine

## 2019-06-04 ENCOUNTER — Inpatient Hospital Stay (HOSPITAL_COMMUNITY): Admission: AD | Admit: 2019-06-04 | Payer: Medicaid Other | Source: Intra-hospital | Admitting: Psychiatry

## 2019-06-04 ENCOUNTER — Other Ambulatory Visit: Payer: Self-pay

## 2019-06-04 DIAGNOSIS — R45851 Suicidal ideations: Secondary | ICD-10-CM | POA: Diagnosis not present

## 2019-06-04 DIAGNOSIS — F332 Major depressive disorder, recurrent severe without psychotic features: Secondary | ICD-10-CM | POA: Insufficient documentation

## 2019-06-04 DIAGNOSIS — Z87891 Personal history of nicotine dependence: Secondary | ICD-10-CM | POA: Insufficient documentation

## 2019-06-04 DIAGNOSIS — Z20822 Contact with and (suspected) exposure to covid-19: Secondary | ICD-10-CM | POA: Insufficient documentation

## 2019-06-04 DIAGNOSIS — F329 Major depressive disorder, single episode, unspecified: Secondary | ICD-10-CM | POA: Diagnosis present

## 2019-06-04 DIAGNOSIS — Z046 Encounter for general psychiatric examination, requested by authority: Secondary | ICD-10-CM

## 2019-06-04 HISTORY — DX: Bipolar disorder, unspecified: F31.9

## 2019-06-04 LAB — CBC WITH DIFFERENTIAL/PLATELET
Abs Immature Granulocytes: 0.03 10*3/uL (ref 0.00–0.07)
Basophils Absolute: 0.1 10*3/uL (ref 0.0–0.1)
Basophils Relative: 1 %
Eosinophils Absolute: 0.4 10*3/uL (ref 0.0–0.5)
Eosinophils Relative: 4 %
HCT: 39.5 % (ref 36.0–46.0)
Hemoglobin: 12.4 g/dL (ref 12.0–15.0)
Immature Granulocytes: 0 %
Lymphocytes Relative: 26 %
Lymphs Abs: 2.9 10*3/uL (ref 0.7–4.0)
MCH: 28.2 pg (ref 26.0–34.0)
MCHC: 31.4 g/dL (ref 30.0–36.0)
MCV: 89.8 fL (ref 80.0–100.0)
Monocytes Absolute: 0.6 10*3/uL (ref 0.1–1.0)
Monocytes Relative: 5 %
Neutro Abs: 7.2 10*3/uL (ref 1.7–7.7)
Neutrophils Relative %: 64 %
Platelets: 323 10*3/uL (ref 150–400)
RBC: 4.4 MIL/uL (ref 3.87–5.11)
RDW: 13.2 % (ref 11.5–15.5)
WBC: 11.1 10*3/uL — ABNORMAL HIGH (ref 4.0–10.5)
nRBC: 0 % (ref 0.0–0.2)

## 2019-06-04 LAB — COMPREHENSIVE METABOLIC PANEL
ALT: 11 U/L (ref 0–44)
AST: 14 U/L — ABNORMAL LOW (ref 15–41)
Albumin: 3.8 g/dL (ref 3.5–5.0)
Alkaline Phosphatase: 57 U/L (ref 38–126)
Anion gap: 5 (ref 5–15)
BUN: 19 mg/dL (ref 6–20)
CO2: 25 mmol/L (ref 22–32)
Calcium: 8.5 mg/dL — ABNORMAL LOW (ref 8.9–10.3)
Chloride: 108 mmol/L (ref 98–111)
Creatinine, Ser: 0.99 mg/dL (ref 0.44–1.00)
GFR calc Af Amer: >60 mL/min (ref >60)
GFR calc non Af Amer: >60 mL/min (ref >60)
Glucose, Bld: 94 mg/dL (ref 70–99)
Potassium: 3.9 mmol/L (ref 3.5–5.1)
Sodium: 138 mmol/L (ref 135–145)
Total Bilirubin: 0.3 mg/dL (ref 0.3–1.2)
Total Protein: 6.6 g/dL (ref 6.5–8.1)

## 2019-06-04 LAB — RAPID URINE DRUG SCREEN, HOSP PERFORMED
Amphetamines: NOT DETECTED
Barbiturates: NOT DETECTED
Benzodiazepines: NOT DETECTED
Cocaine: NOT DETECTED
Opiates: NOT DETECTED
Tetrahydrocannabinol: POSITIVE — AB

## 2019-06-04 LAB — RESPIRATORY PANEL BY RT PCR (FLU A&B, COVID)
Influenza A by PCR: NEGATIVE
Influenza B by PCR: NEGATIVE
SARS Coronavirus 2 by RT PCR: NEGATIVE

## 2019-06-04 LAB — ACETAMINOPHEN LEVEL: Acetaminophen (Tylenol), Serum: <10 ug/mL — ABNORMAL LOW (ref 10–30)

## 2019-06-04 LAB — POC URINE PREG, ED: Preg Test, Ur: NEGATIVE

## 2019-06-04 LAB — SALICYLATE LEVEL: Salicylate Lvl: <7 mg/dL — ABNORMAL LOW (ref 7.0–30.0)

## 2019-06-04 LAB — ETHANOL: Alcohol, Ethyl (B): <10 mg/dL (ref <10)

## 2019-06-04 MED ORDER — HYDROXYZINE HCL 25 MG PO TABS
25.0000 mg | ORAL_TABLET | Freq: Two times a day (BID) | ORAL | Status: DC
  Filled 2019-06-04: qty 1

## 2019-06-04 MED ORDER — ALUM & MAG HYDROXIDE-SIMETH 200-200-20 MG/5ML PO SUSP: 30.0000 mL | Freq: Four times a day (QID) | ORAL | Status: DC | PRN

## 2019-06-04 MED ORDER — ZOLPIDEM TARTRATE 5 MG PO TABS: 5.0000 mg | ORAL_TABLET | Freq: Every evening | ORAL | Status: DC | PRN

## 2019-06-04 MED ORDER — ACETAMINOPHEN 325 MG PO TABS
650.0000 mg | ORAL_TABLET | ORAL | Status: DC | PRN
  Administered 2019-06-05: 650 mg via ORAL
  Filled 2019-06-04: qty 2

## 2019-06-04 MED ORDER — ONDANSETRON HCL 4 MG PO TABS: 4.0000 mg | ORAL_TABLET | Freq: Three times a day (TID) | ORAL | Status: DC | PRN

## 2019-06-04 MED ORDER — BUPROPION HCL ER (SR) 150 MG PO TB12
150.0000 mg | ORAL_TABLET | Freq: Two times a day (BID) | ORAL | Status: DC
  Filled 2019-06-04 (×6): qty 1

## 2019-06-04 MED ORDER — AMLODIPINE BESYLATE 5 MG PO TABS: 2.5000 mg | ORAL_TABLET | Freq: Every day | ORAL | Status: DC

## 2019-06-04 MED ORDER — LORAZEPAM 1 MG PO TABS
1.0000 mg | ORAL_TABLET | Freq: Once | ORAL | Status: AC
  Administered 2019-06-04: 23:00:00 1 mg via ORAL
  Filled 2019-06-04: qty 1

## 2019-06-04 NOTE — ED Provider Notes (Signed)
[facility] EMERGENCY DEPARTMENT Provider Note   CSN: [id number] Arrival date & time: 06/04/19  1636     History Chief Complaint  Patient presents with  . V70.1    Kimberly Olsen is a 29 y.o. female with a past medical history of bipolar disorder and depression.  Patient states that she has been dealing with mental health issues since she was very young.  She had to come off of her medications at the age of 77 when her Medicaid ran out.  She states that she has "been winging it."  Patient lost her husband a year ago and has been trying to raise her 3 children since that time.  She states that this is markedly increased her depression and anxiety and over the past 3 to 4 weeks she has been spiraling downward.  She states that she has daily, unrelenting episodes of crying and panic attacks that are affecting her ability to complete school online.  She is unable to sleep at night.  She has a history of previous suicide attempts and psychiatric hospitalizations.  She states she has been thinking of multiple ways to kill herself but does not have an active plan at this time.  She occasionally smokes marijuana but denies any other drug or alcohol use.  Patient is tearful throughout the examination.  HPI     Past Medical History:  Diagnosis Date  . Anemia   . Anxiety   . Bipolar 1 disorder ([place])   . Depression     Patient Active Problem List   Diagnosis Date Noted  . Postoperative seroma of subcutaneous tissue after non-dermatologic procedure 05/01/2019  . Lipoma of back 01/21/2019  . Normal labor 03/06/2018  . Supervision of high risk pregnancy, antepartum 08/08/2017  . Depression with anxiety 08/08/2017  . History of gestational diabetes 08/08/2017  . GDM (gestational diabetes mellitus), class A1 08/17/2014  . Marijuana use 01/19/2014    Past Surgical History:  Procedure Laterality Date  . LIPOMA EXCISION N/A 02/17/2019   Procedure: EXCISION 1.5CM LIPOMA BACK;  Surgeon: Virl Cagey, MD;  Location: [facility];  Service: General;  Laterality: N/A;  . OOPHORECTOMY Left   . TUBAL LIGATION Bilateral 03/07/2018   Procedure: POST PARTUM TUBAL LIGATION;  Surgeon: Chancy Milroy, MD;  Location: [place];  Service: Gynecology;  Laterality: Bilateral;     OB History    Gravida  3   Para  3   Term  3   Preterm      AB      Living  3     SAB      TAB      Ectopic      Multiple  0   Live Births  3           Family History  Problem Relation Age of Onset  . Depression Mother   . Asthma Mother   . Mental illness Mother   . Other Mother        drug over dose  . Depression Father   . Mental illness Father   . Anesthesia problems Neg Hx   . Hypotension Neg Hx   . Malignant hyperthermia Neg Hx   . Pseudochol deficiency Neg Hx     Social History   Tobacco Use  . Smoking status: Former Smoker    Packs/day: 1.00    Years: 8.00    Pack years: 8.00    Types: Cigarettes    Quit  date: 04/29/2019    Years since quitting: 0.0  . Smokeless tobacco: Never Used  Substance Use Topics  . Alcohol use: No  . Drug use: Yes    Types: Marijuana    Comment: sometimes     Home Medications Prior to Admission medications   Medication Sig Start Date End Date Taking? Authorizing Provider  ibuprofen (ADVIL) 200 MG tablet Take 400 mg by mouth every 6 (six) hours as needed for moderate pain.    [provider]  Multiple Vitamins-Minerals (MULTIVITAMIN WITH MINERALS) tablet Take 1 tablet by mouth daily.    [provider]    Allergies    Coconut fatty acids and Sulfa antibiotics  Review of Systems   Review of Systems Ten systems reviewed and are negative for acute change, except as noted in the HPI.   Physical Exam Updated Vital Signs BP 130/78 (BP Location: Right Arm)   Pulse 74   Temp 98.8 F (37.1 C) (Oral)   Resp 18   Ht 5\' 8"  (1.727 m)   Wt 90.7 kg   LMP 06/04/2019 (Exact Date)   SpO2 98%   BMI 30.41 kg/m    Physical Exam Vitals and nursing note reviewed.  Constitutional:      General: She is not in acute distress.    Appearance: She is well-developed. She is not diaphoretic.  HENT:     Head: Normocephalic and atraumatic.  Eyes:     General: No scleral icterus.    Conjunctiva/sclera: Conjunctivae normal.  Cardiovascular:     Rate and Rhythm: Normal rate and regular rhythm.     Heart sounds: Normal heart sounds. No murmur. No friction rub. No gallop.   Pulmonary:     Effort: Pulmonary effort is normal. No respiratory distress.     Breath sounds: Normal breath sounds.  Abdominal:     General: Bowel sounds are normal. There is no distension.     Palpations: Abdomen is soft. There is no mass.     Tenderness: There is no abdominal tenderness. There is no guarding.  Musculoskeletal:     Cervical back: Normal range of motion.  Skin:    General: Skin is warm and dry.  Neurological:     Mental Status: She is alert and oriented to person, place, and time.  Psychiatric:        Mood and Affect: Mood is anxious and depressed. Affect is tearful.        Behavior: Behavior normal.        Thought Content: Thought content includes suicidal ideation. Thought content does not include suicidal plan.     ED Results / Procedures / Treatments   Labs (all labs ordered are listed, but only abnormal results are displayed) Labs Reviewed  RESPIRATORY PANEL BY RT PCR (FLU A&B, COVID)  COMPREHENSIVE METABOLIC PANEL  ETHANOL  RAPID URINE DRUG SCREEN, HOSP PERFORMED  CBC WITH DIFFERENTIAL/PLATELET  ACETAMINOPHEN LEVEL  SALICYLATE LEVEL  POC URINE PREG, ED    EKG None  Radiology No results found.  Procedures Procedures (including critical care time)  Medications Ordered in ED Medications  acetaminophen (TYLENOL) tablet 650 mg (has no administration in time range)  zolpidem (AMBIEN) tablet 5 mg (has no administration in time range)  ondansetron (ZOFRAN) tablet 4 mg (has no administration in  time range)  alum & mag hydroxide-simeth (MAALOX/MYLANTA) 200-200-20 MG/5ML suspension 30 mL (has no administration in time range)    ED Course  I have reviewed the triage vital signs and  the nursing notes.  Pertinent labs & imaging results that were available during my care of the patient were reviewed by me and considered in my medical decision making (see chart for details).  Clinical Course as of Jun 03 2148  Fri Jun 04, 2019  1956 Patient anxious, does not want to give up her phone. She is requesting benzos, because vistaril has not worked in the past. I discussed that I do not feel comfortable offering benzos at this time, however she can keep her phone until we get recommendation for in or op therapy.   [AH]    Clinical Course User Index [AH] Margarita Mail, PA-C   MDM Rules/Calculators/A&P                      29 year old female here with suicidal ideation.  Patient's behavior became increasingly manipulative and histrionic throughout the night demanding benzodiazepines, refusing to give up her phone after negotiation.  Patient was seen by TTS who did discuss patient's issues with her boyfriend who does relay that she is has told him she wants to kill herself.  Patient threatening to leave due to the phone issue.  I placed her under involuntary commitment with these new validation from the boyfriend I felt she was unsafe to leave.  Patient does appear to be medically clear for psychiatric evaluation.  She has been accepted to the behavioral health hospital pending her Covid virus test.I have reviewed the labs which showed no acute abnormality. Final Clinical Impression(s) / ED Diagnoses Final diagnoses:  None    Rx / DC Orders ED Discharge Orders    None       Margarita Mail, PA-C 06/04/19 2152    Milton Ferguson, MD 06/08/19 1011

## 2019-06-04 NOTE — ED Triage Notes (Signed)
Pt states she is having suicidal thoughts, states she has thought of ways to do it but no specific plan.  She has been having theses problems since her husband was killed last year and she has to do everything on her own.  Tearful during triage.

## 2019-06-04 NOTE — Progress Notes (Signed)
Patient meets inpatient criteria per Lindon Romp, NP. Patient has been faxed out to the following facilities for review:   [facility]  [facility]  [facility]  CSW will continue to follow and assist with disposition.   Domenic Schwab, MSW, LCSW-A Clinical Disposition Social Worker [facility]/TTS [phone]

## 2019-06-04 NOTE — BHH Counselor (Signed)
Updated disposition: Per Larose Kells, RN based on pt's new behaviors not appropriate beds available. TTS to seek placement. Discussed with Wyvonne Lenz. Agricultural consultant to discuss with Jacqulynn Cadet, Therapist, sports.    Vertell Novak, MS, [facility], [facility] Triage Specialist [phone].
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
## 2019-06-05 MED ORDER — LORAZEPAM 1 MG PO TABS
1.0000 mg | ORAL_TABLET | Freq: Once | ORAL | Status: AC
  Administered 2019-06-05: 1 mg via ORAL
  Filled 2019-06-05: qty 1

## 2019-06-05 NOTE — Progress Notes (Signed)
Pt accepted to [facility] Dr. Jetta Lout is the accepting provider.  Call report to [phone] Charlena Cross, RN @ [facility] notified.   Pt is Voluntary Pt may be transported by [place] Pt scheduled to arrive as soon as transportation is set up.   Darletta Moll MSW, [place] Worker Disposition  [facility] Ph: [phone] Fax: [phone]  06/05/2019 11:51 AM

## 2019-06-05 NOTE — Progress Notes (Signed)
Pt accepted to [facility] Dr. Venetia Maxon is the accepting provider.  Call report to [phone] Charlena Cross, RN @ [facility] notified.   Pt is IVC   Pt may be transported by [facility] Dept Pt scheduled to arrive as soon as transport has been set up.    Darletta Moll MSW, [place] Worker Disposition  [facility] Ph: [phone] Fax: [phone]  06/05/2019 1:00 PM
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
## 2019-06-05 NOTE — ED Notes (Signed)
Pt's family member is bringing her clothes.

## 2019-06-05 NOTE — ED Provider Notes (Signed)
Medical screening examination/treatment/procedure(s) were conducted as a shared visit with non-physician practitioner(s) and myself.  I personally evaluated the patient during the encounter.     Patient seen by me along with physician assistant.  Patient with suicidal ideation.  Medically cleared.  Patient's been accepted by behavioral health for admission.  Is at [facility].  Dr. Jetta Lout.  EMTALA has been completed.  Patient is stable for transfer.  Patient's Covid testing is negative.     Fredia Sorrow, MD 06/05/19 1247

## 2019-06-05 NOTE — Progress Notes (Signed)
Pt is under review for inpatient placement at [facility]   Darletta Moll MSW, [place] Worker Disposition  [facility] Ph: [phone] Fax: [phone]

## 2019-06-05 NOTE — ED Notes (Signed)
Pt left with [facility].

## 2019-06-05 NOTE — ED Notes (Signed)
Pt states Ativan worked [employer] well for her. She feels "back to normal"

## 2019-06-05 NOTE — Progress Notes (Signed)
CSW cancelled bed placement at [facility] due to pt having concerns with distance to facility. Pt has been accepted to [place] MSW, [place] Worker Disposition  [facility] Ph: [phone] Fax: [phone]  06/05/2019 12:59 PM

## 2019-06-17 ENCOUNTER — Encounter: Payer: Self-pay | Admitting: Psychology

## 2019-06-17 ENCOUNTER — Other Ambulatory Visit: Payer: Self-pay

## 2019-06-17 ENCOUNTER — Encounter: Payer: Medicaid Other | Attending: Psychology | Admitting: Psychology

## 2019-06-17 DIAGNOSIS — F3130 Bipolar disorder, current episode depressed, mild or moderate severity, unspecified: Secondary | ICD-10-CM | POA: Diagnosis not present

## 2019-06-17 DIAGNOSIS — F418 Other specified anxiety disorders: Secondary | ICD-10-CM | POA: Diagnosis not present
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
## 2019-08-19 ENCOUNTER — Other Ambulatory Visit: Payer: Self-pay

## 2019-08-19 ENCOUNTER — Encounter: Payer: Self-pay | Admitting: General Surgery

## 2019-08-19 ENCOUNTER — Ambulatory Visit (INDEPENDENT_AMBULATORY_CARE_PROVIDER_SITE_OTHER): Payer: Medicaid Other | Admitting: General Surgery

## 2019-08-19 VITALS — BP 102/70 | HR 87 | Temp 97.6°F | Resp 16 | Ht 68.0 in | Wt 198.0 lb

## 2019-08-19 DIAGNOSIS — D179 Benign lipomatous neoplasm, unspecified: Secondary | ICD-10-CM | POA: Diagnosis not present

## 2019-08-19 DIAGNOSIS — L7634 Postprocedural seroma of skin and subcutaneous tissue following other procedure: Secondary | ICD-10-CM

## 2019-08-19 DIAGNOSIS — D171 Benign lipomatous neoplasm of skin and subcutaneous tissue of trunk: Secondary | ICD-10-CM

## 2019-08-19 NOTE — Progress Notes (Signed)
[facility] Note   HPI:  29 y.o. Female presents to clinic for follow-up evaluation after lipoma excision from her left back . Patient reports December 2020. I saw her in February for possible seroma, and she comes back in with complaints of pain in her lower back going down into her leg. She also feels like she can feel a mass in the area where the lipoma was removed. She is worried the lipoma is causing the back and leg pain.  Review of Systems:  Pain in lower back and down into leg ?mass in the prior excision site  All other review of systems: otherwise negative   Vital Signs:  BP 102/70    Pulse 87    Temp 97.6 F (36.4 C) (Other (Comment))    Resp 16    Ht 5\' 8"  (1.727 m)    Wt 198 lb (89.8 kg)    SpO2 97%    BMI 30.11 kg/m    Physical Exam:  Physical Exam Musculoskeletal:     Comments: Left back incision healed, is over the area of the sacral promontory. More cavitation felt and bony structure, I do not feel an obvious lipoma or recurrence.  Tender      Assessment:  29 y.o. yo Female with concern for lipoma in the same region where she had an excision. She is also having back pain and pain into her leg.  I have told her the pain is not related to the lipoma as pain into the leg sounds more like sciatica.   Plan:  - CT a/p to assess the area over the sacral promontory on the left where she has pain and can feel a mass/ versus bony prominence. A CT is best at this point as I do not think a re-excision will help with her pain and will hopefully give [place] more information about the area in question.  - Information on sciatica given  - Will call with results   Curlene Labrum, MD [facility] [address] [place],  [address] [phone] (office)

## 2019-08-19 NOTE — Patient Instructions (Signed)
Will call with results of CT.   Sciatica  Sciatica is pain, numbness, weakness, or tingling along the path of the sciatic nerve. The sciatic nerve starts in the lower back and runs down the back of each leg. The nerve controls the muscles in the lower leg and in the back of the knee. It also provides feeling (sensation) to the back of the thigh, the lower leg, and the sole of the foot. Sciatica is a symptom of another medical condition that pinches or puts pressure on the sciatic nerve. Sciatica most often only affects one side of the body. Sciatica usually goes away on its own or with treatment. In some cases, sciatica may come back (recur). What are the causes? This condition is caused by pressure on the sciatic nerve or pinching of the nerve. This may be the result of:  A disk in between the bones of the spine bulging out too far (herniated disk).  Age-related changes in the spinal disks.  A pain disorder that affects a muscle in the buttock.  Extra bone growth near the sciatic nerve.  A break (fracture) of the pelvis.  Pregnancy.  Tumor. This is rare. What increases the risk? The following factors may make you more likely to develop this condition:  Playing sports that place pressure or stress on the spine.  Having poor strength and flexibility.  A history of back injury or surgery.  Sitting for long periods of time.  Doing activities that involve repetitive bending or lifting.  Obesity. What are the signs or symptoms? Symptoms can vary from mild to very severe, and they may include:  Any of these problems in the lower back, leg, hip, or buttock: ? Mild tingling, numbness, or dull aches. ? Burning sensations. ? Sharp pains.  Numbness in the back of the calf or the sole of the foot.  Leg weakness.  Severe back pain that makes movement difficult. Symptoms may get worse when you cough, sneeze, or laugh, or when you sit or stand for long periods of time. How is this  diagnosed? This condition may be diagnosed based on:  Your symptoms and medical history.  A physical exam.  Blood tests.  Imaging tests, such as: ? X-rays. ? MRI. ? CT scan. How is this treated? In many cases, this condition improves on its own without treatment. However, treatment may include:  Reducing or modifying physical activity.  Exercising and stretching.  Icing and applying heat to the affected area.  Medicines that help to: ? Relieve pain and swelling. ? Relax your muscles.  Injections of medicines that help to relieve pain, irritation, and inflammation around the sciatic nerve (steroids).  Surgery. Follow these instructions at home: Medicines  Take over-the-counter and prescription medicines only as told by your health care provider.  Ask your health care provider if the medicine prescribed to you: ? Requires you to avoid driving or using heavy machinery. ? Can cause constipation. You may need to take these actions to prevent or treat constipation:  Drink enough fluid to keep your urine pale yellow.  Take over-the-counter or prescription medicines.  Eat foods that are high in fiber, such as beans, whole grains, and fresh fruits and vegetables.  Limit foods that are high in fat and processed sugars, such as fried or sweet foods. Managing pain      If directed, put ice on the affected area. ? Put ice in a plastic bag. ? Place a towel between your skin and the bag. ?  Leave the ice on for 20 minutes, 2-3 times a day.  If directed, apply heat to the affected area. Use the heat source that your health care provider recommends, such as a moist heat pack or a heating pad. ? Place a towel between your skin and the heat source. ? Leave the heat on for 20-30 minutes. ? Remove the heat if your skin turns bright red. This is especially important if you are unable to feel pain, heat, or cold. You may have a greater risk of getting burned. Activity   Return  to your normal activities as told by your health care provider. Ask your health care provider what activities are safe for you.  Avoid activities that make your symptoms worse.  Take brief periods of rest throughout the day. ? When you rest for longer periods, mix in some mild activity or stretching between periods of rest. This will help to prevent stiffness and pain. ? Avoid sitting for long periods of time without moving. Get up and move around at least one time each hour.  Exercise and stretch regularly, as told by your health care provider.  Do not lift anything that is heavier than 10 lb (4.5 kg) while you have symptoms of sciatica. When you do not have symptoms, you should still avoid heavy lifting, especially repetitive heavy lifting.  When you lift objects, always use proper lifting technique, which includes: ? Bending your knees. ? Keeping the load close to your body. ? Avoiding twisting. General instructions  Maintain a healthy weight. Excess weight puts extra stress on your back.  Wear supportive, comfortable shoes. Avoid wearing high heels.  Avoid sleeping on a mattress that is too soft or too hard. A mattress that is firm enough to support your back when you sleep may help to reduce your pain.  Keep all follow-up visits as told by your health care provider. This is important. Contact a health care provider if:  You have pain that: ? Wakes you up when you are sleeping. ? Gets worse when you lie down. ? Is worse than you have experienced in the past. ? Lasts longer than 4 weeks.  You have an unexplained weight loss. Get help right away if:  You are not able to control when you urinate or have bowel movements (incontinence).  You have: ? Weakness in your lower back, pelvis, buttocks, or legs that gets worse. ? Redness or swelling of your back. ? A burning sensation when you urinate. Summary  Sciatica is pain, numbness, weakness, or tingling along the path of the  sciatic nerve.  This condition is caused by pressure on the sciatic nerve or pinching of the nerve.  Sciatica can cause pain, numbness, or tingling in the lower back, legs, hips, and buttocks.  Treatment often includes rest, exercise, medicines, and applying ice or heat. This information is not intended to replace advice given to you by your health care provider. Make sure you discuss any questions you have with your health care provider. Document Revised: 03/23/2018 Document Reviewed: 03/23/2018 Elsevier Patient Education  [place].

## 2019-08-20 ENCOUNTER — Encounter: Payer: Self-pay | Admitting: Adult Health

## 2019-08-20 ENCOUNTER — Ambulatory Visit (INDEPENDENT_AMBULATORY_CARE_PROVIDER_SITE_OTHER): Payer: Medicaid Other | Admitting: Adult Health

## 2019-08-20 VITALS — BP 116/75 | HR 73 | Ht 68.0 in | Wt 199.0 lb

## 2019-08-20 DIAGNOSIS — B9689 Other specified bacterial agents as the cause of diseases classified elsewhere: Secondary | ICD-10-CM | POA: Insufficient documentation

## 2019-08-20 DIAGNOSIS — Z0000 Encounter for general adult medical examination without abnormal findings: Secondary | ICD-10-CM

## 2019-08-20 DIAGNOSIS — Z01419 Encounter for gynecological examination (general) (routine) without abnormal findings: Secondary | ICD-10-CM | POA: Insufficient documentation

## 2019-08-20 DIAGNOSIS — N898 Other specified noninflammatory disorders of vagina: Secondary | ICD-10-CM | POA: Diagnosis not present

## 2019-08-20 DIAGNOSIS — N921 Excessive and frequent menstruation with irregular cycle: Secondary | ICD-10-CM

## 2019-08-20 DIAGNOSIS — N760 Acute vaginitis: Secondary | ICD-10-CM | POA: Diagnosis not present

## 2019-08-20 LAB — POCT WET PREP (WET MOUNT)
Clue Cells Wet Prep Whiff POC: NEGATIVE
WBC, Wet Prep HPF POC: POSITIVE

## 2019-08-20 MED ORDER — METRONIDAZOLE 500 MG PO TABS: 500.0000 mg | ORAL_TABLET | Freq: Two times a day (BID) | ORAL | 0 refills | Status: DC

## 2019-08-20 NOTE — Progress Notes (Signed)
Patient ID: Kimberly Olsen, female   DOB: 1990-09-11, 29 y.o.   MRN: [id number] History of Present Illness: Kimberly Olsen is a  29 year  Old white female, widowed, G3P3, in for a well woman gyn exam.She had a normal pap with negative HPV 08/08/17. She does a boyfriend, [name]. 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 She had tubal ligation.    Current Medications, Allergies, Past Medical History, Past Surgical History, Family History and Social History were reviewed in [employer] record.     Review of Systems: Patient denies any headaches, hearing loss, fatigue, blurred vision, shortness of breath, chest pain, abdominal pain, problems with bowel movements, urination, or intercourse. No joint pain or mood swings. Has had some irregular bleeding, between periods    Physical Exam:BP 116/75 (BP Location: Left Arm, Patient Position: Sitting, Cuff Size: Normal)    Pulse 73    Ht 5\' 8"  (1.727 m)    Wt 199 lb (90.3 kg)    LMP  (LMP Unknown)    BMI 30.26 kg/m  General:  Well developed, well nourished, no acute distress Skin:  Warm and dry Neck:  Midline trachea, normal thyroid, good ROM, no lymphadenopathy Lungs; Clear to auscultation bilaterally Breast:  No dominant palpable mass, retraction, or nipple discharge Cardiovascular: Regular rate and rhythm Abdomen:  Soft, non tender, no hepatosplenomegaly Pelvic:  External genitalia is normal in appearance, no lesions.  The vagina is normal in appearance, does have white discharge with some pink in it, no odor.Urethra has no lesions or masses. The cervix is bulbous.  Uterus is felt to be normal size, shape, and contour.  No adnexal masses or tenderness noted.Bladder is non tender, no masses felt. Wet prep: +WBCs and clue cells  Extremities/musculoskeletal:  No swelling or varicosities noted, no clubbing or cyanosis Psych:  No  mood changes, alert and cooperative,seems happy AA 0 Fall risk is low PHQ 9 score is 5, no SI  Examination chaperoned by Diona Fanti.  Impression and plan: 1. Encounter for well woman exam with routine gynecological exam Pap and physical in 1 year She has follow up with Dr Sima Matas for Bipolar 1 and depression  2. Vaginal discharge Will rx flagyl   3. Irregular intermenstrual bleeding Hopefully the flagyl wil help with this but if not let me know   4. BV (bacterial vaginosis)  -will Rx flagyl Meds ordered this encounter  Medications   metroNIDAZOLE (FLAGYL) 500 MG tablet    Sig: Take 1 tablet (500 mg total) by mouth 2 (two) times daily.    Dispense:  14 tablet    Refill:  0    Order Specific Question:   Supervising Provider    Answer:   Tania Ade H [[id number]]

## 2019-09-13 ENCOUNTER — Other Ambulatory Visit: Payer: Self-pay | Admitting: Family Medicine

## 2019-09-13 DIAGNOSIS — D171 Benign lipomatous neoplasm of skin and subcutaneous tissue of trunk: Secondary | ICD-10-CM

## 2019-09-16 ENCOUNTER — Telehealth: Payer: Self-pay | Admitting: General Surgery

## 2019-09-16 DIAGNOSIS — D171 Benign lipomatous neoplasm of skin and subcutaneous tissue of trunk: Secondary | ICD-10-CM

## 2019-09-16 DIAGNOSIS — L7634 Postprocedural seroma of skin and subcutaneous tissue following other procedure: Secondary | ICD-10-CM

## 2019-09-16 NOTE — Telephone Encounter (Signed)
[facility]  US of the back soft tissue ordered to assess for recurrent lipoma versus seroma.   Curlene Labrum, MD [facility] [address] [place], [place] [address] [phone] (office)

## 2019-09-22 NOTE — Telephone Encounter (Signed)
This is the patient I inadvertently misread the approval and she did not get an approval with ins stating they needed an X-ray. The patient will have to file for the appeal as the ins send them a form and I am sure she will not fill out that form. Would you like for me to order the X-ray as requested by ins? After that hopefully/maybe they will approve a CT scan if something is seen.

## 2019-09-22 NOTE — Telephone Encounter (Signed)
Is there anyway I can talk to insurance? Xray is a waste of time, resources, and money.  [place] would at least give [place] a possibility of seeing something.   Curlene Labrum, MD [facility] [address] [place], [place] [address] [phone] (office)

## 2019-09-28 ENCOUNTER — Telehealth (INDEPENDENT_AMBULATORY_CARE_PROVIDER_SITE_OTHER): Payer: Self-pay | Admitting: General Surgery

## 2019-09-28 DIAGNOSIS — G8929 Other chronic pain: Secondary | ICD-10-CM

## 2019-09-28 DIAGNOSIS — M545 Low back pain: Secondary | ICD-10-CM

## 2019-09-28 DIAGNOSIS — D171 Benign lipomatous neoplasm of skin and subcutaneous tissue of trunk: Secondary | ICD-10-CM

## 2019-09-28 NOTE — Telephone Encounter (Signed)
[facility] [employer] documentation reviewed. They insist on Xray. Will order and go from there.  Curlene Labrum, MD [facility] [address] [place], [place] [address] [phone] (office)

## 2019-09-28 NOTE — Telephone Encounter (Signed)
[facility]  Patient with concern for recurrent or new lipoma of the left lower back. Had tried to order CT scan that was denied by insurance and [place] which was denied.   Insurance requesting Xray prior to any additional studies.  Curlene Labrum, MD [facility] [address] [place], [place] [address] [phone] (office)

## 2019-09-28 NOTE — Telephone Encounter (Signed)
Tried to call pt - no answer and mailbox is full

## 2019-10-04 NOTE — Telephone Encounter (Signed)
Called pt - [email or web address]

## 2019-10-18 ENCOUNTER — Ambulatory Visit: Payer: Medicaid Other | Admitting: Psychology

## 2019-11-01 ENCOUNTER — Encounter: Payer: Medicaid Other | Admitting: Psychology

## 2019-11-08 ENCOUNTER — Encounter: Payer: Medicaid Other | Attending: Psychology | Admitting: Psychology

## 2019-11-16 ENCOUNTER — Ambulatory Visit: Payer: Medicaid Other | Admitting: Women's Health

## 2019-11-25 ENCOUNTER — Other Ambulatory Visit: Payer: Self-pay

## 2019-11-25 ENCOUNTER — Other Ambulatory Visit: Payer: Medicaid Other

## 2019-11-25 DIAGNOSIS — Z20822 Contact with and (suspected) exposure to covid-19: Secondary | ICD-10-CM

## 2019-11-27 LAB — SARS-COV-2, NAA 2 DAY TAT

## 2019-11-27 LAB — NOVEL CORONAVIRUS, NAA: SARS-CoV-2, NAA: NOT DETECTED

## 2019-11-29 ENCOUNTER — Encounter: Payer: Medicaid Other | Admitting: Psychology

## 2019-12-20 ENCOUNTER — Encounter: Payer: Medicaid Other | Attending: Psychology | Admitting: Psychology

## 2019-12-28 ENCOUNTER — Ambulatory Visit: Payer: Medicaid Other | Admitting: Women's Health

## 2020-01-13 ENCOUNTER — Ambulatory Visit: Payer: Medicaid Other | Admitting: Advanced Practice Midwife

## 2020-03-23 ENCOUNTER — Telehealth: Payer: Self-pay | Admitting: Adult Health

## 2020-03-23 MED ORDER — MEGESTROL ACETATE 40 MG PO TABS: ORAL_TABLET | ORAL | 0 refills | Status: DC

## 2020-03-23 NOTE — Telephone Encounter (Signed)
Pt has been on her period for 3 weeks and is requesting Megace. Has been on this before. Thanks!! JSY

## 2020-03-23 NOTE — Telephone Encounter (Signed)
Pt's been on her cycle for 3 weeks, would like Rx to stop the bleeding - megestrol (MEGACE) 40 MG tablet   Was given this back in June 2020 when she saw Victorino Dike  Please advise & notify pt    [facility]

## 2020-03-23 NOTE — Telephone Encounter (Signed)
Will rx megace 

## 2020-05-01 ENCOUNTER — Other Ambulatory Visit: Payer: Self-pay

## 2020-05-01 ENCOUNTER — Encounter: Payer: Self-pay | Admitting: Adult Health

## 2020-05-01 ENCOUNTER — Ambulatory Visit (INDEPENDENT_AMBULATORY_CARE_PROVIDER_SITE_OTHER): Payer: Medicaid Other | Admitting: Adult Health

## 2020-05-01 ENCOUNTER — Other Ambulatory Visit (HOSPITAL_COMMUNITY)
Admission: RE | Admit: 2020-05-01 | Discharge: 2020-05-01 | Disposition: A | Discharge status: Home / Self Care | Payer: Medicaid Other | Source: Ambulatory Visit | Attending: Adult Health | Admitting: Adult Health

## 2020-05-01 VITALS — BP 115/74 | HR 86 | Ht 66.25 in | Wt 198.5 lb

## 2020-05-01 DIAGNOSIS — N888 Other specified noninflammatory disorders of cervix uteri: Secondary | ICD-10-CM | POA: Diagnosis not present

## 2020-05-01 DIAGNOSIS — Z8619 Personal history of other infectious and parasitic diseases: Secondary | ICD-10-CM | POA: Insufficient documentation

## 2020-05-01 DIAGNOSIS — N921 Excessive and frequent menstruation with irregular cycle: Secondary | ICD-10-CM

## 2020-05-01 DIAGNOSIS — Z0000 Encounter for general adult medical examination without abnormal findings: Secondary | ICD-10-CM | POA: Insufficient documentation

## 2020-05-01 DIAGNOSIS — Z01419 Encounter for gynecological examination (general) (routine) without abnormal findings: Secondary | ICD-10-CM | POA: Diagnosis not present

## 2020-05-01 DIAGNOSIS — N930 Postcoital and contact bleeding: Secondary | ICD-10-CM | POA: Insufficient documentation

## 2020-05-01 DIAGNOSIS — F418 Other specified anxiety disorders: Secondary | ICD-10-CM | POA: Diagnosis not present

## 2020-05-01 MED ORDER — METRONIDAZOLE 0.75 % VA GEL: 1.0000 | Freq: Every day | VAGINAL | 0 refills | Status: DC

## 2020-05-01 NOTE — Progress Notes (Signed)
Patient ID: Kimberly Olsen, female   DOB: 15-Dec-1990, 30 y.o.   MRN: [id number] History of Present Illness: Kimberly Olsen is a 30 year old white female, widowed has partner, G3P3 in for well woman gyn exam and pap and is complaining of spotting after sex and between periods. She was treated in January for chlamydia at [facility]. She is working at [employer] and finishing up cosmetology school. PCP is Dr Sherrie Sport.    Current Medications, Allergies, Past Medical History, Past Surgical History, Family History and Social History were reviewed in [employer] record.     Review of Systems: Patient denies any headaches, hearing loss, fatigue, blurred vision, shortness of breath, chest pain, abdominal pain, problems with bowel movements, urination, or intercourse. No joint pain or mood swings. Has spotting after sex and in between periods    Physical Exam:BP 115/74 (BP Location: Left Arm, Patient Position: Sitting, Cuff Size: Normal)   Pulse 86   Ht 5' 6.25" (1.683 m)   Wt 198 lb 8 oz (90 kg)   LMP 04/17/2020 (Approximate)   BMI 31.80 kg/m  General:  Well developed, well nourished, no acute distress Skin:  Warm and dry Neck:  Midline trachea, normal thyroid, good ROM, no lymphadenopathy Lungs; Clear to auscultation bilaterally Breast:  No dominant palpable mass, retraction, or nipple discharge Cardiovascular: Regular rate and rhythm Abdomen:  Soft, non tender, no hepatosplenomegaly Pelvic:  External genitalia is normal in appearance, no lesions.  The vagina is normal in appearance. Urethra has no lesions or masses. The cervix is bulbous, and friable with EC brush, pap with GC/CHL and HRHPV genotyping performed,no CMT but says cervix tender.  Uterus is felt to be normal size, shape, and contour.  No adnexal masses or tenderness noted.Bladder is non tender, no masses felt. Extremities/musculoskeletal:  No swelling or varicosities noted, no clubbing or cyanosis Psych:  No mood  changes, alert and cooperative,seems happy AA is 3 Fall risk is low PHQ 9 score is 9 GAD 7 score is 21, she is on Seroquel from PCP and is happy with it,hust anxious  [facility] - 05/01/20 1342      Pregnancy Intention Screening   Does the patient want to become pregnant in the next year? No    Does the patient's partner want to become pregnant in the next year? No    Would the patient like to discuss contraceptive options today? No      Contraception Wrap Up   Current Method Female Sterilization    End Method Female Sterilization    Contraception Counseling Provided No         Examination chaperoned by Levy Pupa LPN  Impression and Plan: 1. Routine medical exam Pap sent  2. Encounter for gynecological examination with Papanicolaou smear of cervix Pap sent Physical in 1 year Pap in 3 if normal Labs with PCP  3. Friable cervix Will rx Metrogel Meds ordered this encounter  Medications  . metroNIDAZOLE (METROGEL VAGINAL) 0.75 % vaginal gel    Sig: Place 1 Applicatorful vaginally at bedtime.    Dispense:  70 g    Refill:  0    Order Specific Question:   Supervising Provider    Answer:   EURE, LUTHER H [[id number]]    4. History of chlamydia  5. Irregular intermenstrual bleeding Will rx Metrogel   6. Postcoital and contact bleeding Will rx Metrogel and let me know if works  7. Depression with anxiety Follow up with PCP

## 2020-05-04 ENCOUNTER — Encounter: Payer: Self-pay | Admitting: Adult Health

## 2020-05-04 ENCOUNTER — Telehealth: Payer: Self-pay | Admitting: Adult Health

## 2020-05-04 DIAGNOSIS — A749 Chlamydial infection, unspecified: Secondary | ICD-10-CM | POA: Insufficient documentation

## 2020-05-04 HISTORY — DX: Chlamydial infection, unspecified: A74.9

## 2020-05-04 LAB — CYTOLOGY - PAP
Chlamydia: POSITIVE — AB
Comment: NEGATIVE
Comment: NEGATIVE
Comment: NORMAL
Diagnosis: NEGATIVE
High risk HPV: NEGATIVE
Neisseria Gonorrhea: NEGATIVE

## 2020-05-04 MED ORDER — FLUCONAZOLE 150 MG PO TABS: ORAL_TABLET | ORAL | 1 refills | Status: DC

## 2020-05-04 MED ORDER — DOXYCYCLINE HYCLATE 100 MG PO TABS: 100.0000 mg | ORAL_TABLET | Freq: Two times a day (BID) | ORAL | 0 refills | Status: DC

## 2020-05-04 NOTE — Telephone Encounter (Signed)
Pt aware +chlamydia on pap, had been treated in January at [facility] for +chalmydiam, pap negative for malignancy, and GC/HPV had yeast on pap will rx doxycycline and diflucan, partner to get treated has not yet.No sex POC in 4 weeks [place] sent.

## 2020-05-23 ENCOUNTER — Other Ambulatory Visit (INDEPENDENT_AMBULATORY_CARE_PROVIDER_SITE_OTHER): Payer: Medicaid Other

## 2020-05-23 ENCOUNTER — Other Ambulatory Visit: Payer: Self-pay

## 2020-05-23 ENCOUNTER — Other Ambulatory Visit (HOSPITAL_COMMUNITY)
Admission: RE | Admit: 2020-05-23 | Discharge: 2020-05-23 | Disposition: A | Discharge status: Home / Self Care | Payer: Medicaid Other | Source: Ambulatory Visit | Attending: Obstetrics & Gynecology | Admitting: Obstetrics & Gynecology

## 2020-05-23 DIAGNOSIS — A749 Chlamydial infection, unspecified: Secondary | ICD-10-CM

## 2020-05-23 NOTE — Progress Notes (Addendum)
   NURSE VISIT- VAGINITIS/STD/POC  SUBJECTIVE:  Kimberly Olsen is a 30 y.o. [phone] GYN patientfemale here for a vaginal swab for proof of cure after treatment for Chlamydia.  She reports the following symptoms: none for N/A N/A. Denies abnormal vaginal bleeding, significant pelvic pain, fever, or UTI symptoms.  OBJECTIVE:  There were no vitals taken for this visit.  Appears well, in no apparent distress  ASSESSMENT: Vaginal swab for proof of cure after treatment for chlamydia  PLAN: Self-collected vaginal probe for Chlamydia sent to lab Treatment: to be determined once results are received Follow-up as needed if symptoms persist/worsen, or new symptoms develop  Olie Dibert A Payton Moder  05/23/2020 4:25 PM   Attestation of Attending Supervision of Nursing Visit Encounter: Evaluation and management procedures were performed by the nursing staff under my supervision and collaboration.  I have reviewed the nurse's note and chart, and I agree with the management and plan.  Jacelyn Grip MD Attending Physician for the Center [facility] 05/23/2020 5:07 PM

## 2020-05-25 LAB — CERVICOVAGINAL ANCILLARY ONLY
Chlamydia: NEGATIVE
Comment: NEGATIVE
Comment: NORMAL
Neisseria Gonorrhea: NEGATIVE

## 2020-06-26 ENCOUNTER — Ambulatory Visit: Payer: Medicaid Other | Admitting: Adult Health

## 2020-08-03 ENCOUNTER — Ambulatory Visit (INDEPENDENT_AMBULATORY_CARE_PROVIDER_SITE_OTHER): Payer: Medicaid Other | Admitting: General Surgery

## 2020-08-03 ENCOUNTER — Encounter: Payer: Self-pay | Admitting: General Surgery

## 2020-08-03 ENCOUNTER — Other Ambulatory Visit: Payer: Self-pay

## 2020-08-03 VITALS — BP 107/73 | HR 83 | Temp 98.4°F | Resp 16 | Ht 68.0 in | Wt 188.0 lb

## 2020-08-03 DIAGNOSIS — G5702 Lesion of sciatic nerve, left lower limb: Secondary | ICD-10-CM | POA: Diagnosis not present

## 2020-08-03 MED ORDER — PREDNISONE 10 MG (21) PO TBPK: ORAL_TABLET | ORAL | 0 refills | Status: DC

## 2020-08-03 NOTE — Patient Instructions (Addendum)
Prednisone taper ordered for sciatica pain. Exercises can help too (see below).  Call and make follow up with Dr. Sherrie Sport so he can follow up on how steroids did and additional imaging/ care as needed.   Sciatica  Sciatica is pain, numbness, weakness, or tingling along the path of the sciatic nerve. The sciatic nerve starts in the lower back and runs down the back of each leg. The nerve controls the muscles in the lower leg and in the back of the knee. It also provides feeling (sensation) to the back of the thigh, the lower leg, and the sole of the foot. Sciatica is a symptom of another medical condition that pinches or puts pressure on the sciatic nerve. Sciatica most often only affects one side of the body. Sciatica usually goes away on its own or with treatment. In some cases, sciatica may come back (recur). What are the causes? This condition is caused by pressure on the sciatic nerve or pinching of the nerve. This may be the result of:  A disk in between the bones of the spine bulging out too far (herniated disk).  Age-related changes in the spinal disks.  A pain disorder that affects a muscle in the buttock.  Extra bone growth near the sciatic nerve.  A break (fracture) of the pelvis.  Pregnancy.  Tumor. This is rare. What increases the risk? The following factors may make you more likely to develop this condition:  Playing sports that place pressure or stress on the spine.  Having poor strength and flexibility.  A history of back injury or surgery.  Sitting for long periods of time.  Doing activities that involve repetitive bending or lifting.  Obesity. What are the signs or symptoms? Symptoms can vary from mild to very severe, and they may include:  Any of these problems in the lower back, leg, hip, or buttock: ? Mild tingling, numbness, or dull aches. ? Burning sensations. ? Sharp pains.  Numbness in the back of the calf or the sole of the foot.  Leg  weakness.  Severe back pain that makes movement difficult. Symptoms may get worse when you cough, sneeze, or laugh, or when you sit or stand for long periods of time. How is this diagnosed? This condition may be diagnosed based on:  Your symptoms and medical history.  A physical exam.  Blood tests.  Imaging tests, such as: ? X-rays. ? MRI. ? CT scan. How is this treated? In many cases, this condition improves on its own without treatment. However, treatment may include:  Reducing or modifying physical activity.  Exercising and stretching.  Icing and applying heat to the affected area.  Medicines that help to: ? Relieve pain and swelling. ? Relax your muscles.  Injections of medicines that help to relieve pain, irritation, and inflammation around the sciatic nerve (steroids).  Surgery. Follow these instructions at home: Medicines  Take over-the-counter and prescription medicines only as told by your health care provider.  Ask your health care provider if the medicine prescribed to you: ? Requires you to avoid driving or using heavy machinery. ? Can cause constipation. You may need to take these actions to prevent or treat constipation:  Drink enough fluid to keep your urine pale yellow.  Take over-the-counter or prescription medicines.  Eat foods that are high in fiber, such as beans, whole grains, and fresh fruits and vegetables.  Limit foods that are high in fat and processed sugars, such as fried or sweet foods. Managing pain  If directed, put ice on the affected area. ? Put ice in a plastic bag. ? Place a towel between your skin and the bag. ? Leave the ice on for 20 minutes, 2-3 times a day.  If directed, apply heat to the affected area. Use the heat source that your health care provider recommends, such as a moist heat pack or a heating pad. ? Place a towel between your skin and the heat source. ? Leave the heat on for 20-30 minutes. ? Remove the heat  if your skin turns bright red. This is especially important if you are unable to feel pain, heat, or cold. You may have a greater risk of getting burned.      Activity  Return to your normal activities as told by your health care provider. Ask your health care provider what activities are safe for you.  Avoid activities that make your symptoms worse.  Take brief periods of rest throughout the day. ? When you rest for longer periods, mix in some mild activity or stretching between periods of rest. This will help to prevent stiffness and pain. ? Avoid sitting for long periods of time without moving. Get up and move around at least one time each hour.  Exercise and stretch regularly, as told by your health care provider.  Do not lift anything that is heavier than 10 lb (4.5 kg) while you have symptoms of sciatica. When you do not have symptoms, you should still avoid heavy lifting, especially repetitive heavy lifting.  When you lift objects, always use proper lifting technique, which includes: ? Bending your knees. ? Keeping the load close to your body. ? Avoiding twisting.   General instructions  Maintain a healthy weight. Excess weight puts extra stress on your back.  Wear supportive, comfortable shoes. Avoid wearing high heels.  Avoid sleeping on a mattress that is too soft or too hard. A mattress that is firm enough to support your back when you sleep may help to reduce your pain.  Keep all follow-up visits as told by your health care provider. This is important. Contact a health care provider if:  You have pain that: ? Wakes you up when you are sleeping. ? Gets worse when you lie down. ? Is worse than you have experienced in the past. ? Lasts longer than 4 weeks.  You have an unexplained weight loss. Get help right away if:  You are not able to control when you urinate or have bowel movements (incontinence).  You have: ? Weakness in your lower back, pelvis, buttocks, or  legs that gets worse. ? Redness or swelling of your back. ? A burning sensation when you urinate. Summary  Sciatica is pain, numbness, weakness, or tingling along the path of the sciatic nerve.  This condition is caused by pressure on the sciatic nerve or pinching of the nerve.  Sciatica can cause pain, numbness, or tingling in the lower back, legs, hips, and buttocks.  Treatment often includes rest, exercise, medicines, and applying ice or heat. This information is not intended to replace advice given to you by your health care provider. Make sure you discuss any questions you have with your health care provider. Document Revised: 03/23/2018 Document Reviewed: 03/23/2018 Elsevier Patient Education  2021 [employer].   Sciatica Rehab Ask your health care provider which exercises are safe for you. Do exercises exactly as told by your health care provider and adjust them as directed. It is normal to feel mild stretching, pulling,  tightness, or discomfort as you do these exercises. Stop right away if you feel sudden pain or your pain gets worse. Do not begin these exercises until told by your health care provider. Stretching and range-of-motion exercises These exercises warm up your muscles and joints and improve the movement and flexibility of your hips and back. These exercises also help to relieve pain, numbness, and tingling. Sciatic nerve glide 1. Sit in a chair with your head facing down toward your chest. Place your hands behind your back. Let your shoulders slump forward. 2. Slowly straighten one of your legs while you tilt your head back as if you are looking toward the ceiling. Only straighten your leg as far as you can without making your symptoms worse. 3. Hold this position for __________ seconds. 4. Slowly return to the starting position. 5. Repeat with your other leg. Repeat __________ times. Complete this exercise __________ times a day. Knee to chest with hip adduction and  internal rotation 1. Lie on your back on a firm surface with both legs straight. 2. Bend one of your knees and move it up toward your chest until you feel a gentle stretch in your lower back and buttock. Then, move your knee toward the shoulder that is on the opposite side from your leg. This is hip adduction and internal rotation. ? Hold your leg in this position by holding on to the front of your knee. 3. Hold this position for __________ seconds. 4. Slowly return to the starting position. 5. Repeat with your other leg. Repeat __________ times. Complete this exercise __________ times a day.   Prone extension on elbows 1. Lie on your abdomen on a firm surface. A bed may be too soft for this exercise. 2. Prop yourself up on your elbows. 3. Use your arms to help lift your chest up until you feel a gentle stretch in your abdomen and your lower back. ? This will place some of your body weight on your elbows. If this is uncomfortable, try stacking pillows under your chest. ? Your hips should stay down, against the surface that you are lying on. Keep your hip and back muscles relaxed. 4. Hold this position for __________ seconds. 5. Slowly relax your upper body and return to the starting position. Repeat __________ times. Complete this exercise __________ times a day.   Strengthening exercises These exercises build strength and endurance in your back. Endurance is the ability to use your muscles for a long time, even after they get tired. Pelvic tilt This exercise strengthens the muscles that lie deep in the abdomen. 1. Lie on your back on a firm surface. Bend your knees and keep your feet flat on the floor. 2. Tense your abdominal muscles. Tip your pelvis up toward the ceiling and flatten your lower back into the floor. ? To help with this exercise, you may place a small towel under your lower back and try to push your back into the towel. 3. Hold this position for __________ seconds. 4. Let  your muscles relax completely before you repeat this exercise. Repeat __________ times. Complete this exercise __________ times a day. Alternating arm and leg raises 1. Get on your hands and knees on a firm surface. If you are on a hard floor, you may want to use padding, such as an exercise mat, to cushion your knees. 2. Line up your arms and legs. Your hands should be directly below your shoulders, and your knees should be directly below your hips.  3. Lift your left leg behind you. At the same time, raise your right arm and straighten it in front of you. ? Do not lift your leg higher than your hip. ? Do not lift your arm higher than your shoulder. ? Keep your abdominal and back muscles tight. ? Keep your hips facing the ground. ? Do not arch your back. ? Keep your balance carefully, and do not hold your breath. 4. Hold this position for __________ seconds. 5. Slowly return to the starting position. 6. Repeat with your right leg and your left arm. Repeat __________ times. Complete this exercise __________ times a day.   Posture and body mechanics Good posture and healthy body mechanics can help to relieve stress in your body's tissues and joints. Body mechanics refers to the movements and positions of your body while you do your daily activities. Posture is part of body mechanics. Good posture means:  Your spine is in its natural S-curve position (neutral).  Your shoulders are pulled back slightly.  Your head is not tipped forward. Follow these guidelines to improve your posture and body mechanics in your everyday activities. Standing  When standing, keep your spine neutral and your feet about hip width apart. Keep a slight bend in your knees. Your ears, shoulders, and hips should line up.  When you do a task in which you stand in one place for a long time, place one foot up on a stable object that is 2-4 inches (5-10 cm) high, such as a footstool. This helps keep your spine neutral.    Sitting  When sitting, keep your spine neutral and keep your feet flat on the floor. Use a footrest, if necessary, and keep your thighs parallel to the floor. Avoid rounding your shoulders, and avoid tilting your head forward.  When working at a desk or a computer, keep your desk at a height where your hands are slightly lower than your elbows. Slide your chair under your desk so you are close enough to maintain good posture.  When working at a computer, place your monitor at a height where you are looking straight ahead and you do not have to tilt your head forward or downward to look at the screen.   Resting  When lying down and resting, avoid positions that are most painful for you.  If you have pain with activities such as sitting, bending, stooping, or squatting, lie in a position in which your body does not bend very much. For example, avoid curling up on your side with your arms and knees near your chest (fetal position).  If you have pain with activities such as standing for a long time or reaching with your arms, lie with your spine in a neutral position and bend your knees slightly. Try the following positions: ? Lying on your side with a pillow between your knees. ? Lying on your back with a pillow under your knees. Lifting  When lifting objects, keep your feet at least shoulder width apart and tighten your abdominal muscles.  Bend your knees and hips and keep your spine neutral. It is important to lift using the strength of your legs, not your back. Do not lock your knees straight out.  Always ask for help to lift heavy or awkward objects.   This information is not intended to replace advice given to you by your health care provider. Make sure you discuss any questions you have with your health care provider. Document Revised: 06/26/2018 Document Reviewed:  03/26/2018 Elsevier Patient Education  2021 [employer].

## 2020-08-03 NOTE — Progress Notes (Signed)
[facility] Note   HPI:  30 y.o. Female presents to clinic for evaluation of left leg pain going from her sacrum on the left down her left leg that is shooting in nature. 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 She wants to make sure this is not causing this issues and thinks she probably has sciatica. She denies any pain on the right.   She had felt like she had a recurrence 09/2019 and we offered CT or [place] but insurance denied this and wanted an Xray.    Review of Systems:  Pain down left back leg All other review of systems: otherwise negative   Vital Signs:  BP 107/73   Pulse 83   Temp 98.4 F (36.9 C) (Other (Comment))   Resp 16   Ht 5\' 8"  (1.727 m)   Wt 188 lb (85.3 kg)   BMI 28.59 kg/m    Physical Exam:  Physical Exam Vitals reviewed.  Cardiovascular:     Rate and Rhythm: Normal rate.  Pulmonary:     Effort: Pulmonary effort is normal.  Musculoskeletal:     Comments: Well healed left lower back scar, no palpable mass, right over the sacrum      Assessment:  30 y.o. yo Female with what is likely sciatica and not related to the lipoma or the excision.  Plan:  Prednisone taper ordered for sciatica pain.  Exercises can help too (see below).  Call and make follow up with Dr. Sherrie Sport so he can follow up on how steroids did and additional imaging/ care as needed.   PRN follow up    Curlene Labrum, MD [facility] [address] [place], [place] [address] [phone] (office)

## 2021-07-12 ENCOUNTER — Other Ambulatory Visit (HOSPITAL_COMMUNITY)
Admission: RE | Admit: 2021-07-12 | Discharge: 2021-07-12 | Disposition: A | Discharge status: Home / Self Care | Payer: Medicaid Other | Source: Ambulatory Visit | Attending: Women's Health | Admitting: Women's Health

## 2021-07-12 ENCOUNTER — Encounter: Payer: Self-pay | Admitting: Women's Health

## 2021-07-12 ENCOUNTER — Ambulatory Visit (INDEPENDENT_AMBULATORY_CARE_PROVIDER_SITE_OTHER): Payer: Medicaid Other | Admitting: Women's Health

## 2021-07-12 VITALS — BP 89/53 | HR 74 | Ht 67.5 in | Wt 152.0 lb

## 2021-07-12 DIAGNOSIS — Z01419 Encounter for gynecological examination (general) (routine) without abnormal findings: Secondary | ICD-10-CM

## 2021-07-12 DIAGNOSIS — Z0000 Encounter for general adult medical examination without abnormal findings: Secondary | ICD-10-CM | POA: Diagnosis not present

## 2021-07-12 DIAGNOSIS — Z113 Encounter for screening for infections with a predominantly sexual mode of transmission: Secondary | ICD-10-CM | POA: Diagnosis present

## 2021-07-12 NOTE — Progress Notes (Signed)
? ?WELL-WOMAN EXAMINATION ?Patient name: Kimberly Olsen MRN [id number]  Date of birth: 05-04-1990 ?Chief Complaint:   ?Annual Exam ? ?History of Present Illness:   ?Kimberly Olsen is a 31 y.o. G25P3003 Caucasian female being seen today for a routine well-woman exam.  ?Current complaints: wants STD screen, no sx ? ?PCP: Hasanaj      ?does not desire labs ?Patient's last menstrual period was 07/11/2021 (exact date). ?The current method of family planning is tubal ligation.  ?Last pap 05/01/20. Results were: NILM w/ HRHPV negative. H/O abnormal pap: yes ASCUS 2012 ?Last mammogram: never. Results were: N/A. Family h/o breast cancer: no ?Last colonoscopy: never. Results were: N/A. Family h/o colorectal cancer: no ? ? ?  07/12/2021  ?  3:31 PM 05/01/2020  ?  1:41 PM 08/20/2019  ? 12:46 PM 09/24/2018  ?  3:22 PM 08/27/2018  ?  4:38 PM  ?Depression screen PHQ 2/9  ?Decreased Interest '1 1 1 2 2  '$ ?Down, Depressed, Hopeless '1 1 1 1 3  '$ ?PHQ - 2 Score '2 2 2 3 5  '$ ?Altered sleeping '1 3 1 3 3  '$ ?Tired, decreased energy '1 2 1 2 3  '$ ?Change in appetite 0 1 0 0 0  ?Feeling bad or failure about yourself  0 1 0 1 2  ?Trouble concentrating 1 0 '1 1 2  '$ ?Moving slowly or fidgety/restless 0 0 0 2 3  ?Suicidal thoughts 0 0 0 0 0  ?PHQ-9 Score '5 9 5 12 18  '$ ?Difficult doing work/chores    Somewhat difficult   ? ?  ? ?  07/12/2021  ?  3:32 PM 05/01/2020  ?  1:42 PM 08/20/2019  ? 12:47 PM  ?GAD 7 : Generalized Anxiety Score  ?Nervous, Anxious, on Edge '1 3 1  '$ ?Control/stop worrying '1 3 1  '$ ?Worry too much - different things '1 3 1  '$ ?Trouble relaxing '1 3 1  '$ ?Restless '1 3 1  '$ ?Easily annoyed or irritable '1 3 1  '$ ?Afraid - awful might happen '1 3 1  '$ ?Total GAD 7 Score '7 21 7  '$ ? ? ? ?Review of Systems:   ?Pertinent items are noted in HPI ?Denies any headaches, blurred vision, fatigue, shortness of breath, chest pain, abdominal pain, abnormal vaginal discharge/itching/odor/irritation, problems with periods, bowel movements, urination, or intercourse unless otherwise  stated above. ?Pertinent History Reviewed:  ?Reviewed past medical,surgical, social and family history.  ?Reviewed problem list, medications and allergies. ?Physical Assessment:  ? ?Vitals:  ? 07/12/21 1529  ?BP: (!) 89/53  ?Pulse: 74  ?Weight: 152 lb (68.9 kg)  ?Height: 5' 7.5" (1.715 m)  ?Body mass index is 23.46 kg/m?. ?  ?     Physical Examination:  ? General appearance - well appearing, and in no distress ? Mental status - alert, oriented to person, place, and time ? Psych:  She has a normal mood and affect ? Skin - warm and dry, normal color, no suspicious lesions noted ? Chest - effort normal, all lung fields clear to auscultation bilaterally ? Heart - normal rate and regular rhythm ? Neck:  midline trachea, no thyromegaly or nodules ? Breasts - breasts appear normal, no suspicious masses, no skin or nipple changes or  axillary nodes ? Abdomen - soft, nontender, nondistended, no masses or organomegaly ? Pelvic - VULVA: normal appearing vulva with no masses, tenderness or lesions  VAGINA: normal appearing vagina with normal color and discharge, no lesions  CERVIX: normal appearing cervix without discharge or  lesions, no CMT ? Thin prep pap is not done, CV swab collected ? UTERUS: uterus is felt to be normal size, shape, consistency and nontender  ? ADNEXA: No adnexal masses or tenderness noted. ? Extremities:  No swelling or varicosities noted ? ?Chaperone: Glenard Haring [place]   ? ?No results found for this or any previous visit (from the past 24 hour(s)).  ?Assessment & Plan:  ?1) Well-Woman Exam ? ?2) STD screen> CV swab ? ?Labs/procedures today: CV swab ? ?Mammogram: @ 33yo, or sooner if problems ?Colonoscopy: @ 33yo, or sooner if problems ? ?No orders of the defined types were placed in this encounter. ? ? ?Meds: No orders of the defined types were placed in this encounter. ? ? ?Follow-up: Return in about 1 year (around 07/13/2022) for Physical. ? ?Roma Schanz CNM, WHNP-BC ?07/12/2021 ?3:50 PM  ?

## 2021-07-17 LAB — CERVICOVAGINAL ANCILLARY ONLY
Bacterial Vaginitis (gardnerella): NEGATIVE
Candida Glabrata: POSITIVE — AB
Candida Vaginitis: NEGATIVE
Chlamydia: NEGATIVE
Comment: NEGATIVE
Comment: NEGATIVE
Comment: NEGATIVE
Comment: NEGATIVE
Comment: NEGATIVE
Comment: NORMAL
Neisseria Gonorrhea: NEGATIVE
Trichomonas: NEGATIVE

## 2021-07-18 ENCOUNTER — Telehealth: Payer: Self-pay | Admitting: *Deleted

## 2021-07-18 MED ORDER — FLUCONAZOLE 100 MG PO TABS: ORAL_TABLET | ORAL | 0 refills | Status: DC

## 2021-07-18 NOTE — Telephone Encounter (Signed)
Called patient an informed her of yeast on vaginal swab. She would like to have diflucan sent in for this. Advised that Maudie Mercury is not in the office but I would ask another provider to sent it in and she can check with her pharmacy later today.  ?

## 2021-07-18 NOTE — Telephone Encounter (Signed)
Will rx diflucan 100 mg 1 daily for 10 days  ?

## 2021-10-04 ENCOUNTER — Ambulatory Visit (INDEPENDENT_AMBULATORY_CARE_PROVIDER_SITE_OTHER): Payer: Medicaid Other | Admitting: Adult Health

## 2021-10-04 ENCOUNTER — Other Ambulatory Visit (HOSPITAL_COMMUNITY)
Admission: RE | Admit: 2021-10-04 | Discharge: 2021-10-04 | Disposition: A | Discharge status: Home / Self Care | Payer: Medicaid Other | Source: Ambulatory Visit | Attending: Adult Health | Admitting: Adult Health

## 2021-10-04 ENCOUNTER — Encounter: Payer: Self-pay | Admitting: Adult Health

## 2021-10-04 VITALS — BP 109/67 | HR 84 | Ht 67.5 in | Wt 155.5 lb

## 2021-10-04 DIAGNOSIS — R102 Pelvic and perineal pain: Secondary | ICD-10-CM | POA: Diagnosis not present

## 2021-10-04 DIAGNOSIS — Z113 Encounter for screening for infections with a predominantly sexual mode of transmission: Secondary | ICD-10-CM | POA: Diagnosis not present

## 2021-10-04 DIAGNOSIS — N926 Irregular menstruation, unspecified: Secondary | ICD-10-CM | POA: Insufficient documentation

## 2021-10-04 DIAGNOSIS — N730 Acute parametritis and pelvic cellulitis: Secondary | ICD-10-CM | POA: Diagnosis not present

## 2021-10-04 DIAGNOSIS — Z3202 Encounter for pregnancy test, result negative: Secondary | ICD-10-CM | POA: Diagnosis not present

## 2021-10-04 LAB — POCT URINE PREGNANCY: Preg Test, Ur: NEGATIVE

## 2021-10-04 MED ORDER — CEFTRIAXONE SODIUM 500 MG IJ SOLR
500.0000 mg | Freq: Once | INTRAMUSCULAR | Status: AC
  Administered 2021-10-04: 500 mg via INTRAMUSCULAR

## 2021-10-04 MED ORDER — METRONIDAZOLE 500 MG PO TABS: ORAL_TABLET | ORAL | 0 refills | Status: DC

## 2021-10-04 MED ORDER — DOXYCYCLINE HYCLATE 100 MG PO TABS: 100.0000 mg | ORAL_TABLET | Freq: Two times a day (BID) | ORAL | 0 refills | Status: DC

## 2021-10-04 MED ORDER — DOXYCYCLINE HYCLATE 100 MG PO TABS: ORAL_TABLET | ORAL | 0 refills | Status: DC

## 2021-10-04 NOTE — Addendum Note (Signed)
Addended by: Linton Rump on: 10/04/2021 05:06 PM   Modules accepted: Orders

## 2021-10-04 NOTE — Progress Notes (Addendum)
Subjective:     Patient ID: Kimberly Olsen, female   DOB: 08/01/1990, 31 y.o.   MRN: [id number]  HPI Kimberly Olsen is a 31 year old white female, widowed, G3P3 in complaining of irregular bleeding, will bleed after sex, will bleed if strains with BM, for about 3 weeks now, has low pelvic pain more to right and into back and headaches when bleeding. Has been tired. Has had chills, had Tuesday night.  Lab Results  Component Value Date   DIAGPAP  05/01/2020    - Negative for intraepithelial lesion or malignancy (NILM)   HPV NOT DETECTED 08/08/2017   [place] Negative 05/01/2020   PCP is Dr Sherrie Sport.  Review of Systems +irregular bleeding, has after sex and if strains with BM Has pelvic pain and pain in low back Has headache with bleeding  Had hills 2 days ago +tired Reviewed past medical,surgical, social and family history. Reviewed medications and allergies.     Objective:   Physical Exam BP 109/67 (BP Location: Left Arm, Patient Position: Sitting, Cuff Size: Normal)   Pulse 84   Ht 5' 7.5" (1.715 m)   Wt 155 lb 8 oz (70.5 kg)   LMP 08/30/2021   BMI 24.00 kg/m  UPT is negative. Skin warm and dry.Pelvic: external genitalia is normal in appearance no lesions, vagina: pink discharge,urethra has no lesions or masses noted, cervix:bulbous,+CMT, uterus: normal size, shape and contour, positive tender, no masses felt, adnexa: no masses,+tenderness noted. Bladder is non tender and no masses felt.  Low back pain. CV swab obtained.      Upstream - 10/04/21 1603       Pregnancy Intention Screening   Does the patient want to become pregnant in the next year? No    Does the patient's partner want to become pregnant in the next year? No    Would the patient like to discuss contraceptive options today? No      Contraception Wrap Up   Current Method Female Sterilization    End Method Female Sterilization            Examination chaperoned by Levy Pupa LPN  Assessment:       1. Pregnancy  examination or test, negative result - POCT urine pregnancy  2. Irregular bleeding Has bleeding after sex and if strains for about 3 weeks now Will check labs and CV swab sent  - CBC - Comprehensive metabolic panel - TSH - Cervicovaginal ancillary only( [place])  3. Screening examination for STD (sexually transmitted disease) CV swab sent for GC/CHL, trich and BV and yeast - Cervicovaginal ancillary only( [place])  4. Pelvic pain Has had pain low pelvic area, and into back for about 3 weeks I suspect PID   5. PID (acute pelvic inflammatory disease) Will give rocephin 500 mg IM in office now and wait for 15 minutes before leaving Will rx doxycycline and flagyl, no sex or alcohol while taking  Review handout on PID Meds ordered this encounter  Medications   DISCONTD: doxycycline (VIBRA-TABS) 100 MG tablet    Sig: Take 1 tablet (100 mg total) by mouth 2 (two) times daily.    Dispense:  14 tablet    Refill:  0    Order Specific Question:   Supervising Provider    Answer:   Tania Ade H [[id number]]   doxycycline (VIBRA-TABS) 100 MG tablet    Sig: Take 1 2 x daily for 14 days    Dispense:  28 tablet  Refill:  0    Order Specific Question:   Supervising Provider    Answer:   Tania Ade H [[id number]]   metroNIDAZOLE (FLAGYL) 500 MG tablet    Sig: Take 1 2 x daily for 14 days, no sex or alcohol while taking    Dispense:  28 tablet    Refill:  0    Order Specific Question:   Supervising Provider    Answer:   Florian Buff [[id number]]       Plan:     Return in 2 weeks for pelvic exam if still has any bleeding or pain will get pelvic US to assess uterus and ovaries.

## 2021-10-05 LAB — CBC
Hematocrit: 34.6 % (ref 34.0–46.6)
Hemoglobin: 11.4 g/dL (ref 11.1–15.9)
MCH: 26.7 pg (ref 26.6–33.0)
MCHC: 32.9 g/dL (ref 31.5–35.7)
MCV: 81 fL (ref 79–97)
Platelets: 358 10*3/uL (ref 150–450)
RBC: 4.27 x10E6/uL (ref 3.77–5.28)
RDW: 13.4 % (ref 11.7–15.4)
WBC: 9.3 10*3/uL (ref 3.4–10.8)

## 2021-10-05 LAB — COMPREHENSIVE METABOLIC PANEL
ALT: 14 IU/L (ref 0–32)
AST: 18 IU/L (ref 0–40)
Albumin/Globulin Ratio: 1.9 (ref 1.2–2.2)
Albumin: 3.8 g/dL — ABNORMAL LOW (ref 3.9–4.9)
Alkaline Phosphatase: 55 IU/L (ref 44–121)
BUN/Creatinine Ratio: 15 (ref 9–23)
BUN: 13 mg/dL (ref 6–20)
Bilirubin Total: 0.5 mg/dL (ref 0.0–1.2)
CO2: 24 mmol/L (ref 20–29)
Calcium: 8.9 mg/dL (ref 8.7–10.2)
Chloride: 101 mmol/L (ref 96–106)
Creatinine, Ser: 0.84 mg/dL (ref 0.57–1.00)
Globulin, Total: 2 g/dL (ref 1.5–4.5)
Glucose: 113 mg/dL — ABNORMAL HIGH (ref 70–99)
Potassium: 3.5 mmol/L (ref 3.5–5.2)
Sodium: 138 mmol/L (ref 134–144)
Total Protein: 5.8 g/dL — ABNORMAL LOW (ref 6.0–8.5)
eGFR: 94 mL/min/{1.73_m2} (ref >59)

## 2021-10-05 LAB — TSH: TSH: 0.783 u[IU]/mL (ref 0.450–4.500)

## 2021-10-08 LAB — CERVICOVAGINAL ANCILLARY ONLY
Bacterial Vaginitis (gardnerella): NEGATIVE
Candida Glabrata: NEGATIVE
Candida Vaginitis: NEGATIVE
Chlamydia: NEGATIVE
Comment: NEGATIVE
Comment: NEGATIVE
Comment: NEGATIVE
Comment: NEGATIVE
Comment: NEGATIVE
Comment: NORMAL
Neisseria Gonorrhea: NEGATIVE
Trichomonas: POSITIVE — AB

## 2021-10-09 ENCOUNTER — Encounter: Payer: Self-pay | Admitting: Adult Health

## 2021-10-09 DIAGNOSIS — A599 Trichomoniasis, unspecified: Secondary | ICD-10-CM | POA: Insufficient documentation

## 2021-10-09 HISTORY — DX: Trichomoniasis, unspecified: A59.9

## 2021-10-18 ENCOUNTER — Ambulatory Visit (INDEPENDENT_AMBULATORY_CARE_PROVIDER_SITE_OTHER): Payer: Self-pay | Admitting: Adult Health

## 2021-10-18 ENCOUNTER — Encounter: Payer: Self-pay | Admitting: Adult Health

## 2021-10-18 ENCOUNTER — Other Ambulatory Visit (HOSPITAL_COMMUNITY)
Admission: RE | Admit: 2021-10-18 | Discharge: 2021-10-18 | Disposition: A | Discharge status: Home / Self Care | Payer: Medicaid Other | Source: Ambulatory Visit | Attending: Adult Health | Admitting: Adult Health

## 2021-10-18 VITALS — BP 101/63 | HR 61 | Ht 68.0 in | Wt 160.0 lb

## 2021-10-18 DIAGNOSIS — A599 Trichomoniasis, unspecified: Secondary | ICD-10-CM | POA: Diagnosis present

## 2021-10-18 DIAGNOSIS — Z8742 Personal history of other diseases of the female genital tract: Secondary | ICD-10-CM | POA: Insufficient documentation

## 2021-10-18 DIAGNOSIS — R102 Pelvic and perineal pain: Secondary | ICD-10-CM | POA: Diagnosis present

## 2021-10-18 DIAGNOSIS — Z113 Encounter for screening for infections with a predominantly sexual mode of transmission: Secondary | ICD-10-CM

## 2021-10-18 NOTE — Progress Notes (Signed)
  Subjective:     Patient ID: Kimberly Olsen, female   DOB: Jul 20, 1990, 31 y.o.   MRN: [id number]  HPI Kimberly Olsen is a 31 year old white female, widowed, G3P3 in for follow up on being treated for PID on 10/04/21 and still has pelvic pain.  Lab Results  Component Value Date   DIAGPAP  05/01/2020    - Negative for intraepithelial lesion or malignancy (NILM)   HPV NOT DETECTED 08/08/2017   [name] Negative 05/01/2020   PCP is Dr Telford Nab  Review of Systems Still has pelvic pain, not has bad Had bleeding from vagina after BM Reviewed past medical,surgical, social and family history. Reviewed medications and allergies.     Objective:   Physical Exam BP 101/63 (BP Location: Left Arm, Patient Position: Sitting, Cuff Size: Normal)   Pulse 61   Ht '5\' 8"'$  (1.727 m)   Wt 160 lb (72.6 kg)   LMP 10/18/2021   BMI 24.33 kg/m     Skin warm and dry.Pelvic: external genitalia is normal in appearance no lesions, vagina: +period blood,has pain in vaginal side wall,urethra has no lesions or masses noted, cervix:smooth and bulbous, no CMT,uterus: normal size, shape and contour, + mildly tender, no masses felt, adnexa: no masses, LLQ tenderness noted. Bladder is non tender and no masses felt. CV swab obtained.  Upstream - 10/18/21 1624       Pregnancy Intention Screening   Does the patient want to become pregnant in the next year? No    Does the patient's partner want to become pregnant in the next year? No    Would the patient like to discuss contraceptive options today? No      Contraception Wrap Up   Current Method Female Sterilization    End Method Female Sterilization            Examination chaperoned by Levy Pupa LPN.  Assessment:     1. Pelvic pain Still has pelvic pain Will get pelvic US 10/30/21 at 10:30 am at [facility] to assess uterus and ovaries,this fit her scheduled  2. Trichimoniasis CV swab sent for proof of cure Rx given to her for her partner Kimberly Olsen, for flagyl 500 mg  #4 take 4 po now, no alcohol Handout given on Trich  3. Screening examination for STD (sexually transmitted disease) CV swab sent Check HIV,RPR, Hepatitis C and B   4. History of PID     Plan:     Follow up with about 2 weeks

## 2021-10-19 IMAGING — US US TRANSVAGINAL NON-OB
1 series · 13 of 25 positions shown · non-contrast
Comparison: None.

CLINICAL DATA: Right pelvic pain.  Prior left oophorectomy.

EXAM:
TRANSABDOMINAL AND TRANSVAGINAL ULTRASOUND OF PELVIS
DOPPLER ULTRASOUND OF OVARIES
TECHNIQUE: Both transabdominal and transvaginal ultrasound examinations of the
pelvis were performed. Transabdominal technique was performed for
global imaging of the pelvis including uterus, ovaries, adnexal
regions, and pelvic cul-de-sac.
It was necessary to proceed with endovaginal exam following the
transabdominal exam to visualize the uterus, endometrium and right
ovary. Color and duplex Doppler ultrasound was utilized to evaluate
blood flow to the ovaries.

[Series 1: us transvaginal non-ob · 13 of 60 slices shown]
[im 1/60]
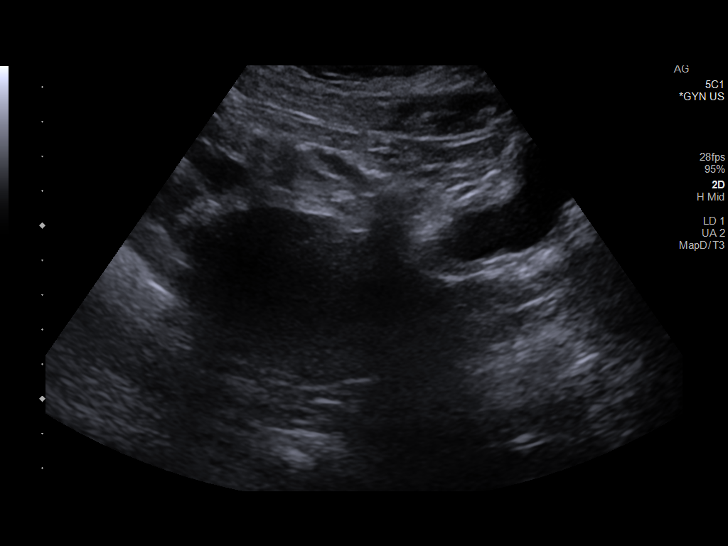
[im 5/60]
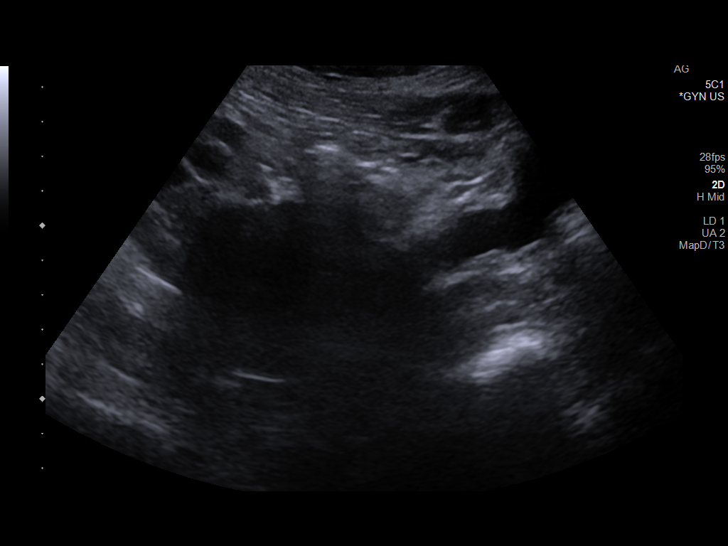
[im 10/60]
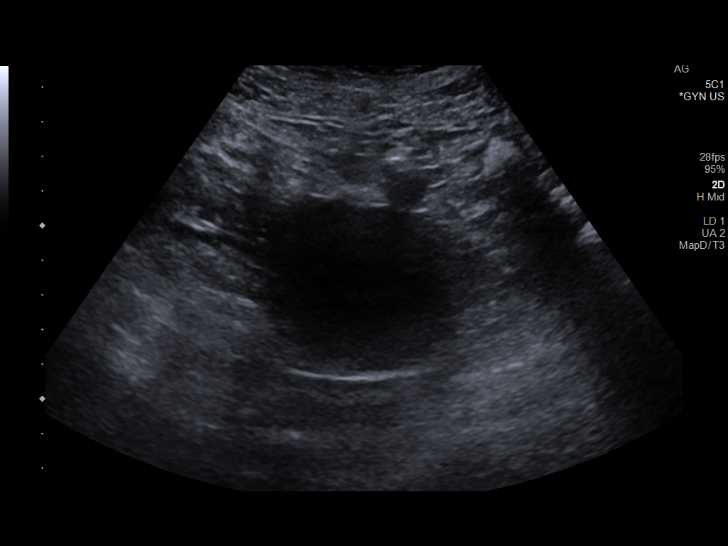
[im 15/60]
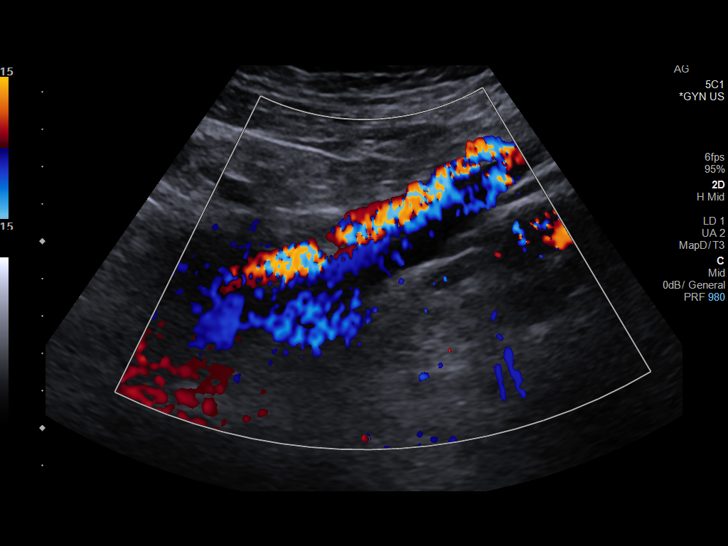
[im 20/60]
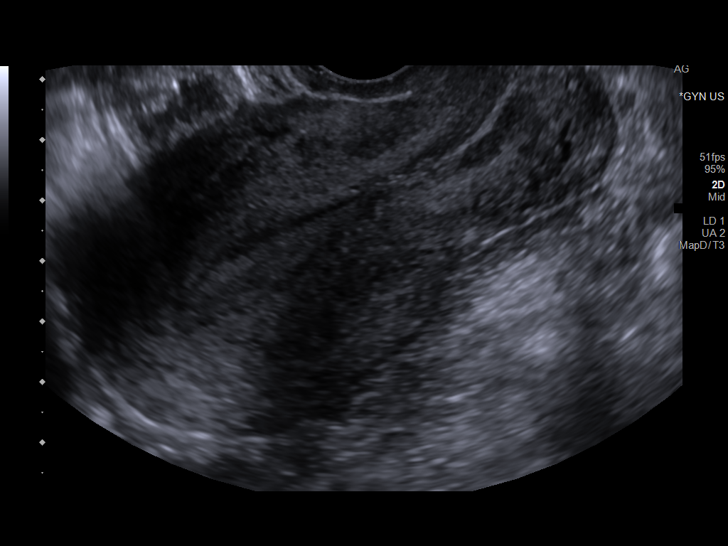
[im 25/60]
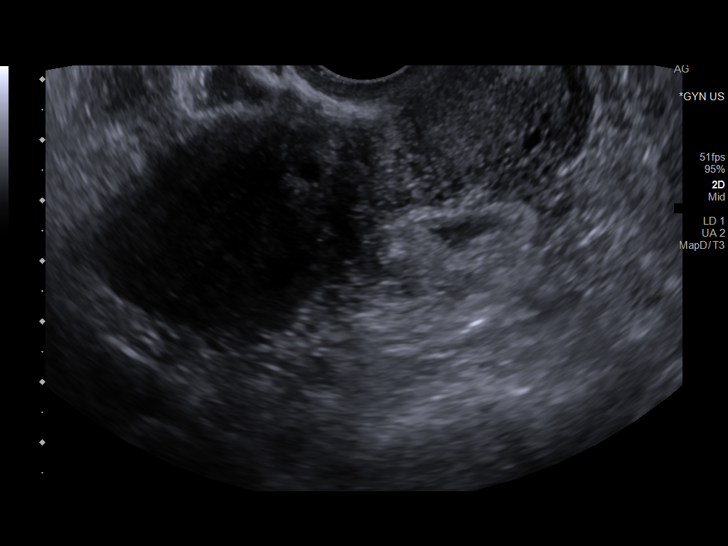
[im 30/60]
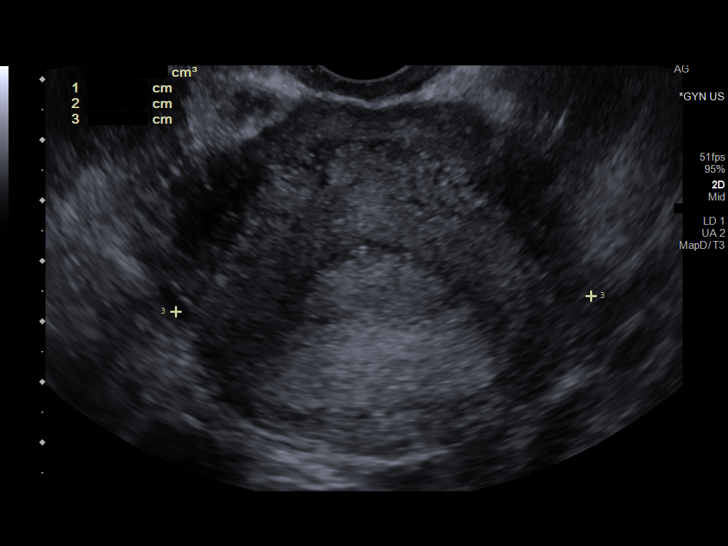
[im 35/60]
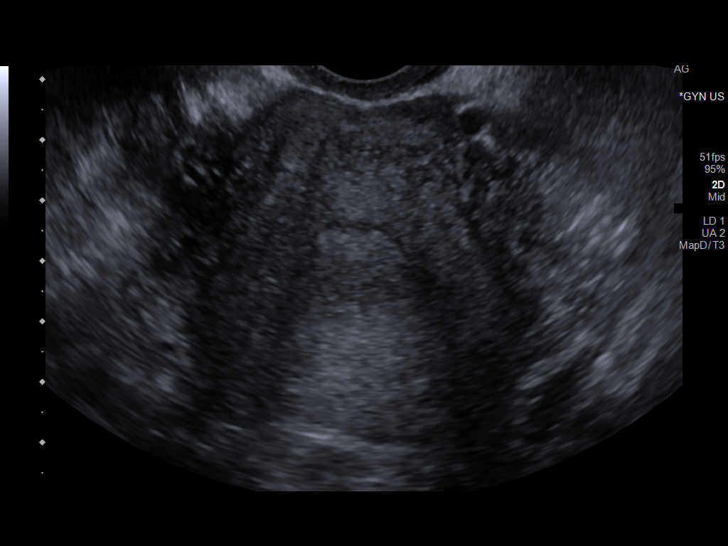
[im 40/60]
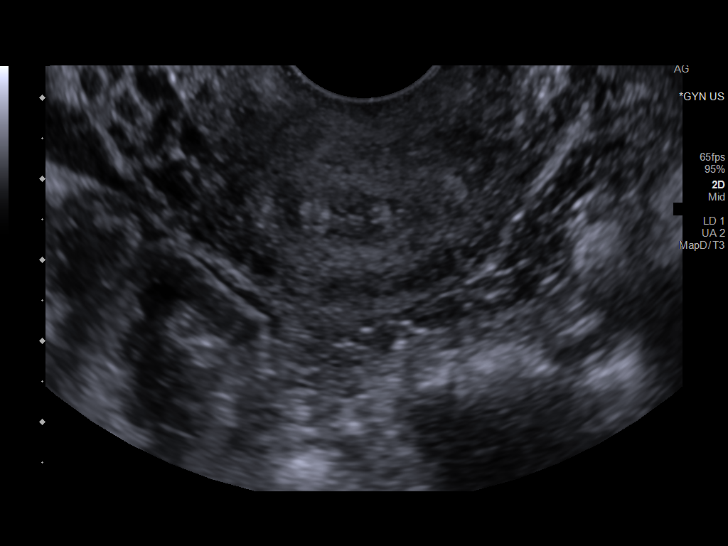
[im 45/60]
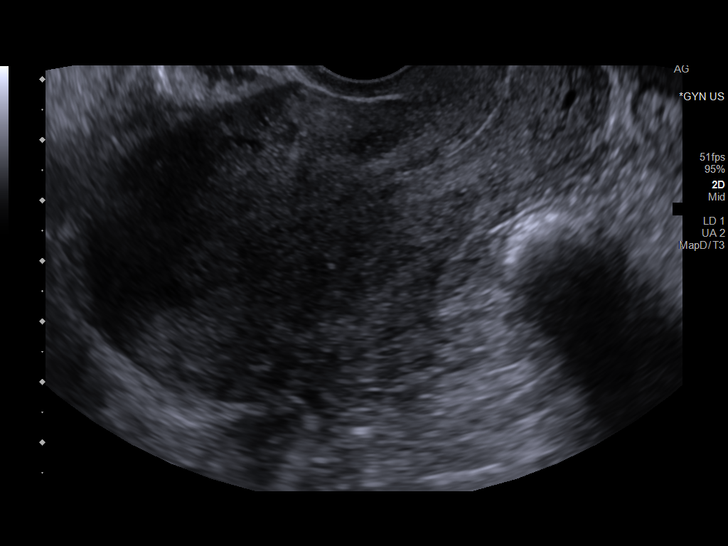
[im 50/60]
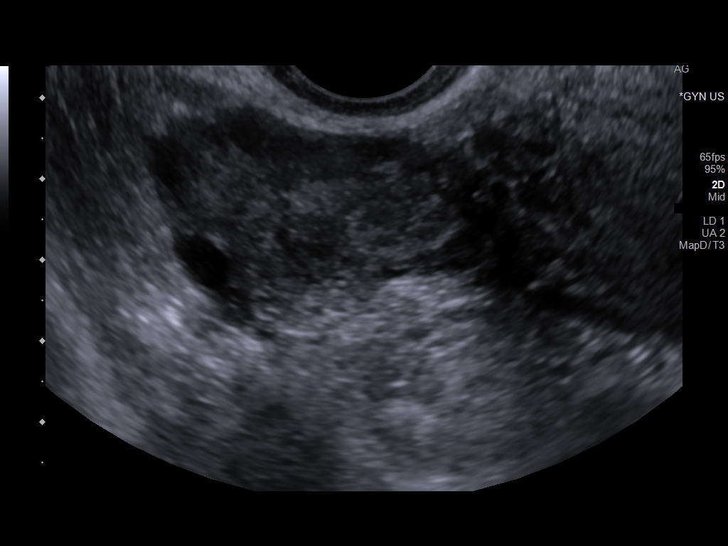
[im 55/60]
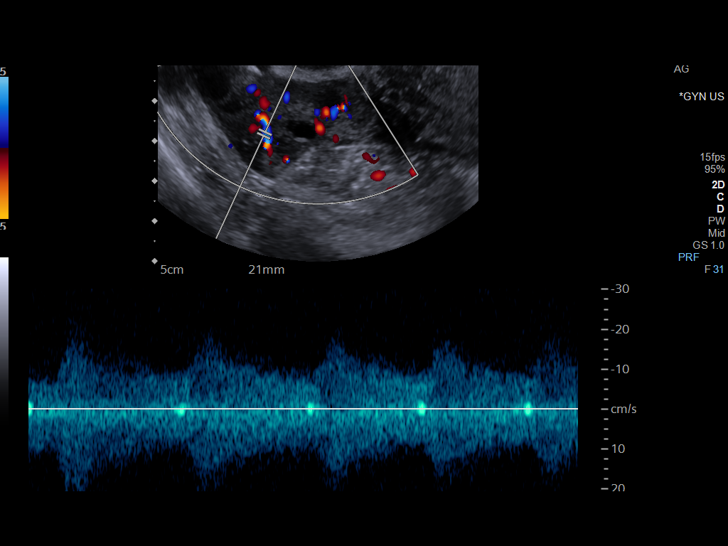
[im 60/60]
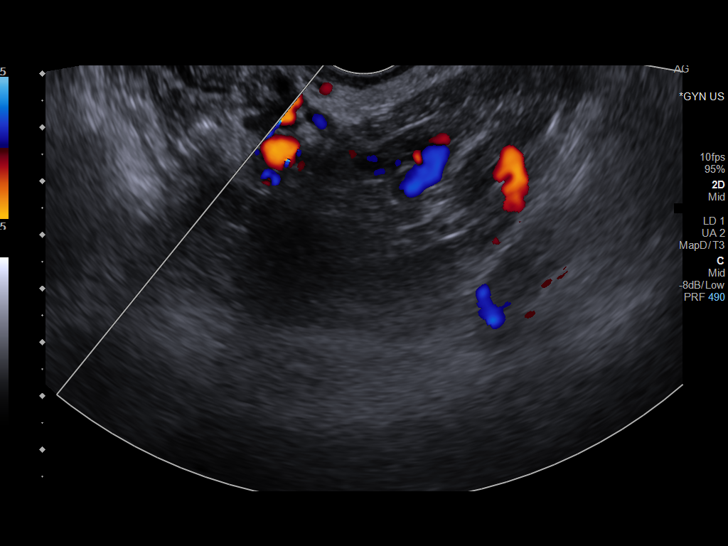

[13 of 25 positions shown; findings below may reference images not displayed]

FINDINGS: Uterus

Measurements: 9.6 x 5.1 x 6.9 cm = volume: 176 mL. No fibroids or
other mass visualized.

Endometrium

Thickness: 6 mm in thickness.  No focal abnormality visualized.

Right ovary

Measurements: 4.3 x 2.5 x 2.4 cm = volume: 13 mL. Normal
appearance/no adnexal mass.

Left ovary

Measurements: Prior oophorectomy.  No adnexal mass.

Pulsed Doppler evaluation of the right ovary demonstrates normal
low-resistance arterial and venous waveforms.

Other findings

No abnormal free fluid.
IMPRESSION: Unremarkable study.

Prior left oophorectomy.

## 2021-10-19 IMAGING — US US PELVIS COMPLETE
1 series · 13 of 25 positions shown · non-contrast
Comparison: None.

CLINICAL DATA: Right pelvic pain.  Prior left oophorectomy.

EXAM:
TRANSABDOMINAL AND TRANSVAGINAL ULTRASOUND OF PELVIS
DOPPLER ULTRASOUND OF OVARIES
TECHNIQUE: Both transabdominal and transvaginal ultrasound examinations of the
pelvis were performed. Transabdominal technique was performed for
global imaging of the pelvis including uterus, ovaries, adnexal
regions, and pelvic cul-de-sac.
It was necessary to proceed with endovaginal exam following the
transabdominal exam to visualize the uterus, endometrium and right
ovary. Color and duplex Doppler ultrasound was utilized to evaluate
blood flow to the ovaries.

[Series 1: us pelvis complete · 13 of 60 slices shown]
[im 1/60]
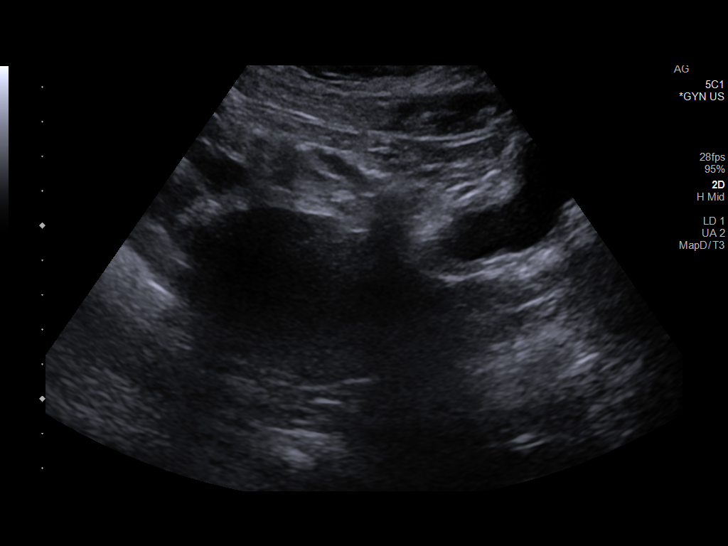
[im 5/60]
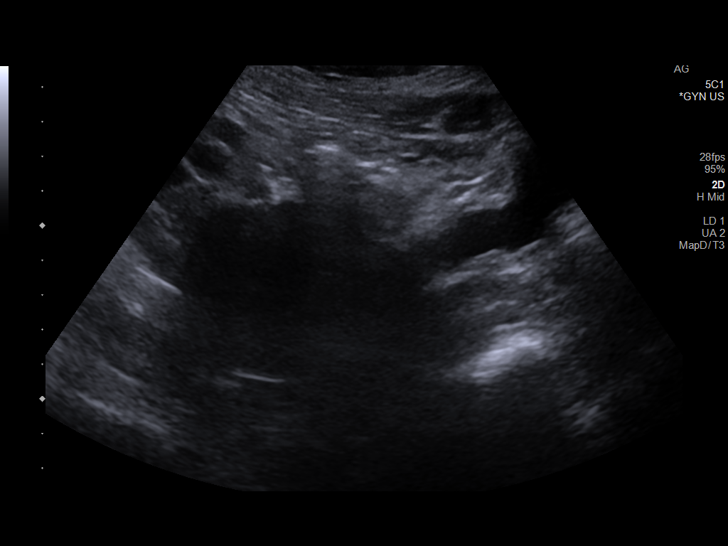
[im 10/60]
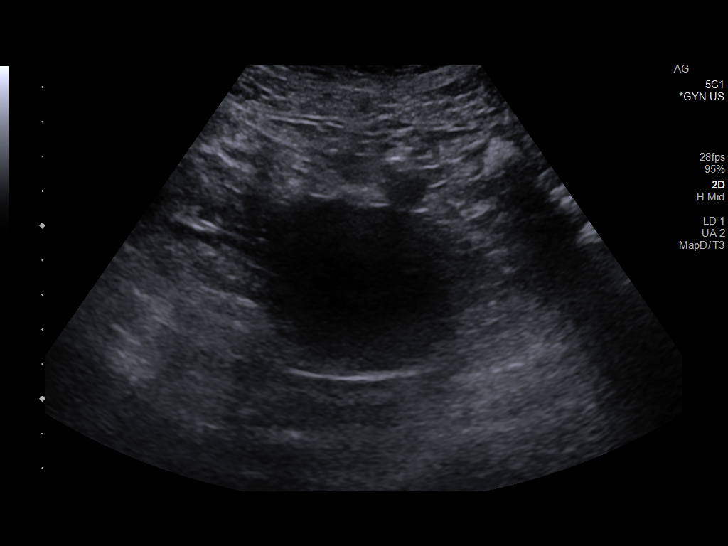
[im 15/60]
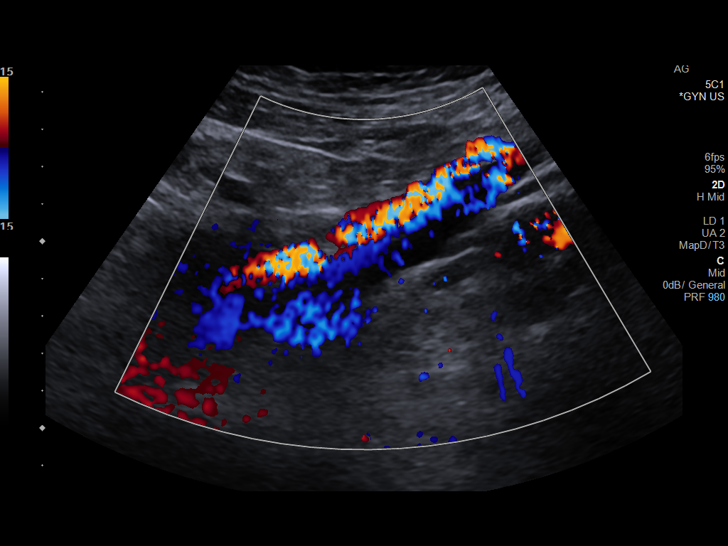
[im 20/60]
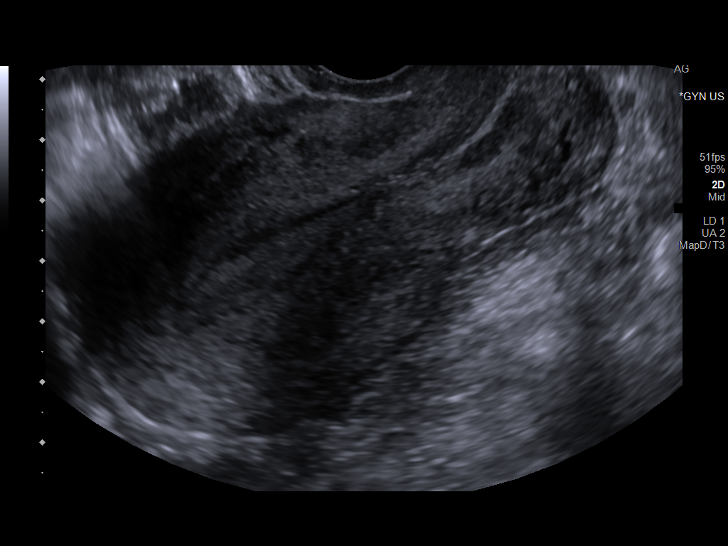
[im 25/60]
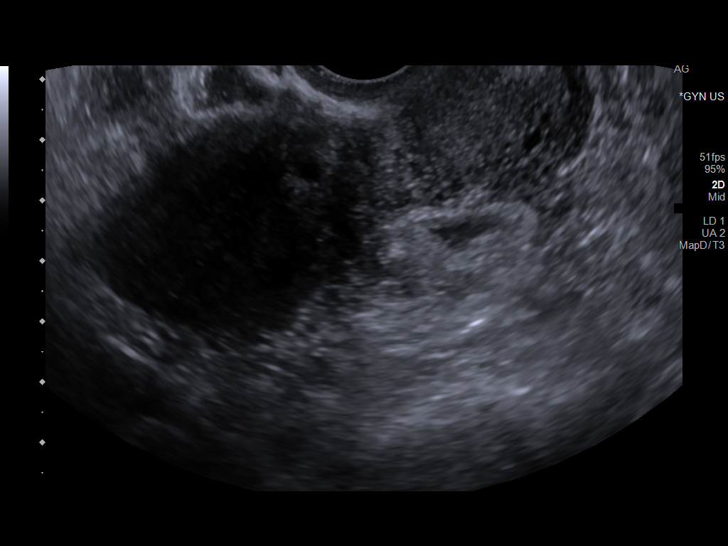
[im 30/60]
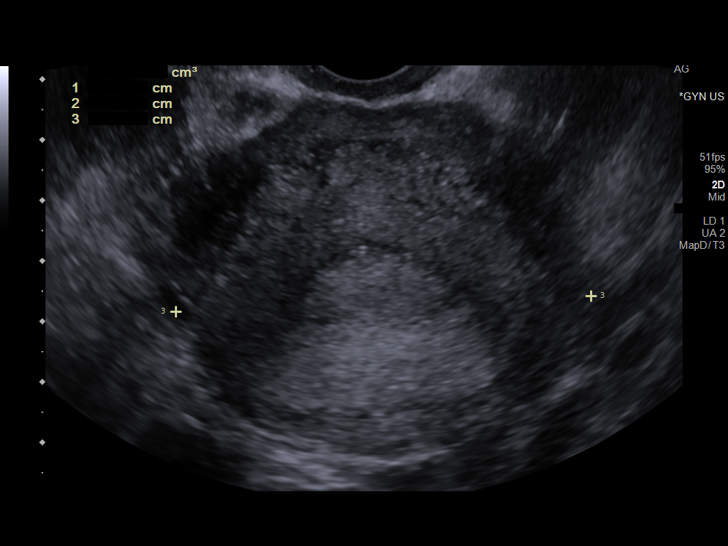
[im 35/60]
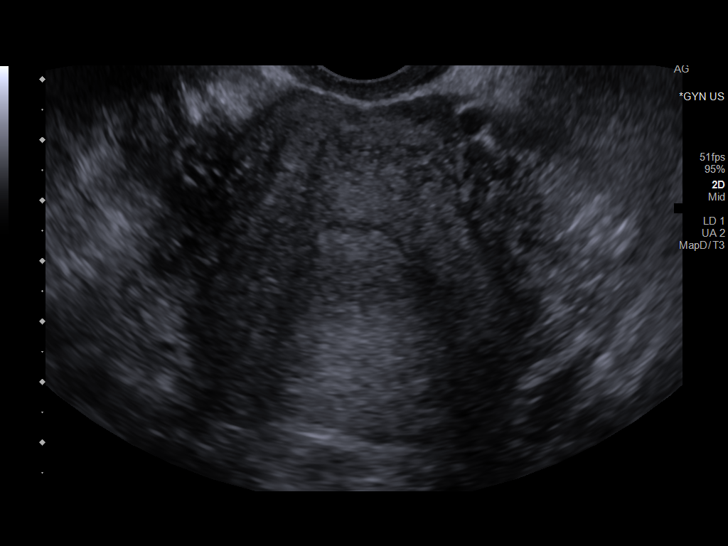
[im 40/60]
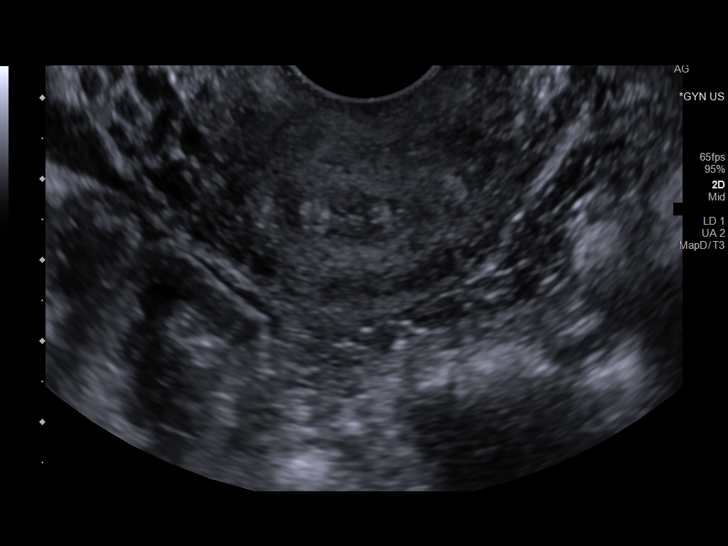
[im 45/60]
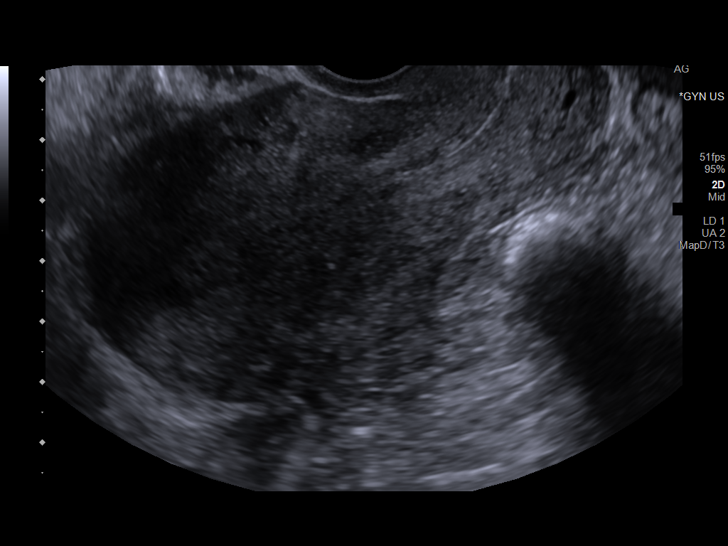
[im 50/60]
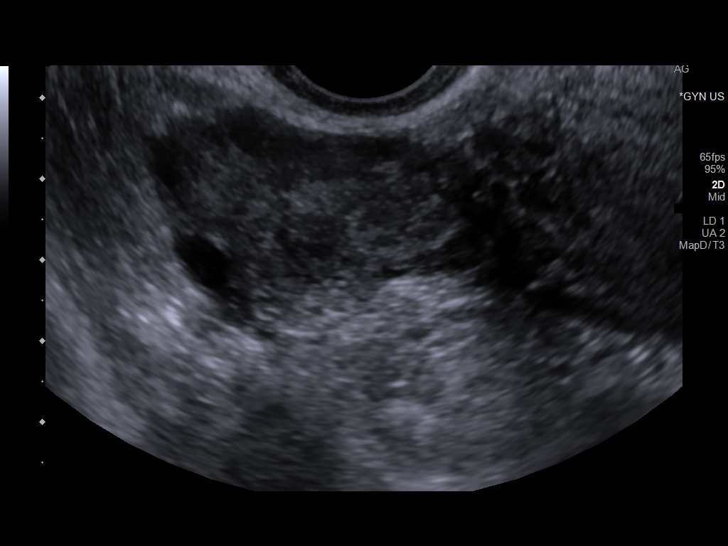
[im 55/60]
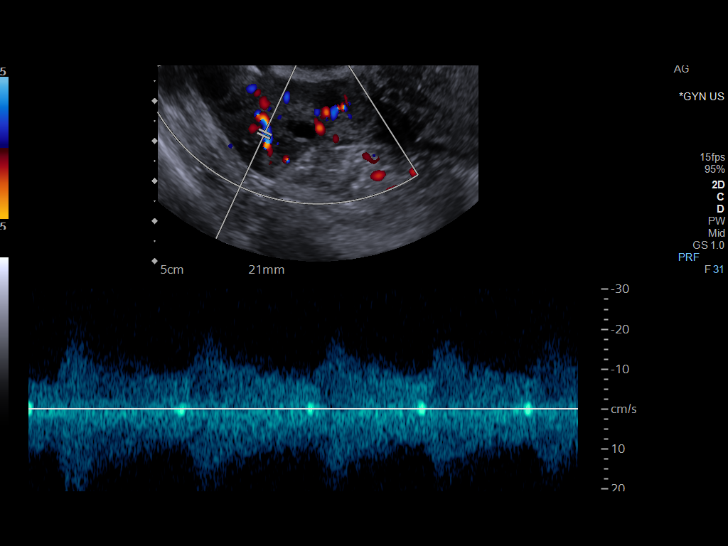
[im 60/60]
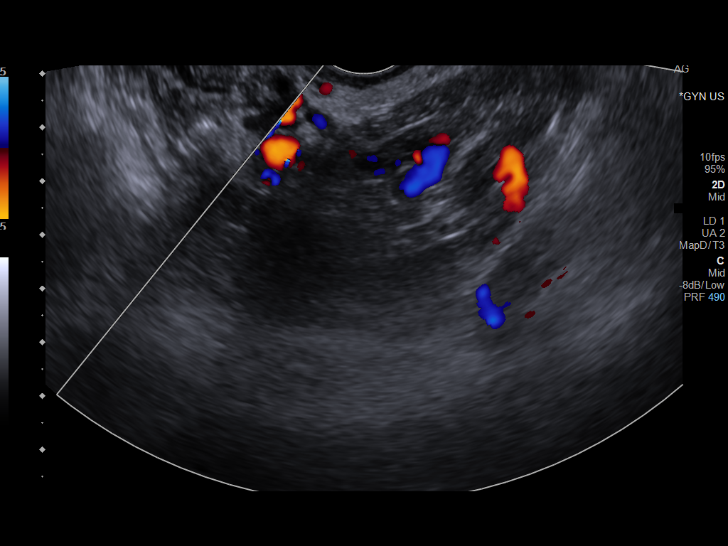

[13 of 25 positions shown; findings below may reference images not displayed]

FINDINGS: Uterus

Measurements: 9.6 x 5.1 x 6.9 cm = volume: 176 mL. No fibroids or
other mass visualized.

Endometrium

Thickness: 6 mm in thickness.  No focal abnormality visualized.

Right ovary

Measurements: 4.3 x 2.5 x 2.4 cm = volume: 13 mL. Normal
appearance/no adnexal mass.

Left ovary

Measurements: Prior oophorectomy.  No adnexal mass.

Pulsed Doppler evaluation of the right ovary demonstrates normal
low-resistance arterial and venous waveforms.

Other findings

No abnormal free fluid.
IMPRESSION: Unremarkable study.

Prior left oophorectomy.

## 2021-10-22 LAB — CERVICOVAGINAL ANCILLARY ONLY
Chlamydia: NEGATIVE
Comment: NEGATIVE
Comment: NEGATIVE
Comment: NORMAL
Neisseria Gonorrhea: NEGATIVE
Trichomonas: POSITIVE — AB

## 2021-10-23 ENCOUNTER — Other Ambulatory Visit: Payer: Self-pay | Admitting: Adult Health

## 2021-10-23 MED ORDER — METRONIDAZOLE 500 MG PO TABS: 500.0000 mg | ORAL_TABLET | Freq: Two times a day (BID) | ORAL | 0 refills | Status: DC

## 2021-10-30 ENCOUNTER — Ambulatory Visit (HOSPITAL_COMMUNITY): Admission: RE | Admit: 2021-10-30 | Payer: Self-pay | Source: Ambulatory Visit

## 2021-11-01 ENCOUNTER — Ambulatory Visit: Payer: Self-pay | Admitting: Adult Health

## 2022-02-06 ENCOUNTER — Ambulatory Visit: Payer: Medicaid Other | Admitting: Adult Health

## 2022-02-13 ENCOUNTER — Ambulatory Visit: Payer: Medicaid Other | Admitting: Adult Health

## 2022-02-27 ENCOUNTER — Ambulatory Visit (INDEPENDENT_AMBULATORY_CARE_PROVIDER_SITE_OTHER): Payer: Medicaid Other | Admitting: Adult Health

## 2022-02-27 ENCOUNTER — Encounter: Payer: Self-pay | Admitting: Adult Health

## 2022-02-27 VITALS — BP 84/53 | HR 69 | Ht 68.0 in | Wt 156.0 lb

## 2022-02-27 DIAGNOSIS — K580 Irritable bowel syndrome with diarrhea: Secondary | ICD-10-CM | POA: Diagnosis not present

## 2022-02-27 DIAGNOSIS — R102 Pelvic and perineal pain: Secondary | ICD-10-CM

## 2022-02-27 MED ORDER — DICYCLOMINE HCL 10 MG PO CAPS: 10.0000 mg | ORAL_CAPSULE | Freq: Three times a day (TID) | ORAL | 3 refills | Status: DC

## 2022-02-27 NOTE — Progress Notes (Signed)
  Subjective:     Patient ID: Kimberly Olsen, female   DOB: [birth year], 31 y.o.   MRN: [id number]  HPI Kimberly Olsen is a 31 year old white female, widowed, G3P3 in complaining of pelvic pain on and off with cramps and has IBS with diarrhea nearer periods, and has low back pain. Has pain with sex at times too. She did not get pelvic US in August, due to losing medicaid and it cost too much.  Last pap was negative for malignancy and HPV 05/01/20.  PCP is Dr Sherrie Sport  Review of Systems +pelvic pain on and off with cramps and has IBS with diarrhea nearer periods, and has low back pain. Has pain with sex at times too.   Reviewed past medical,surgical, social and family history. Reviewed medications and allergies.  Objective:   Physical Exam BP (!) 84/53 (BP Location: Left Arm, Patient Position: Sitting, Cuff Size: Normal)   Pulse 69   Ht '5\' 8"'$  (1.727 m)   Wt 156 lb (70.8 kg)   LMP 02/21/2022 (Approximate)   BMI 23.72 kg/m     Skin warm and dry.Pelvic: external genitalia is normal in appearance no lesions, vagina: pink,urethra has no lesions or masses noted, cervix:smooth and bulbous, uterus: normal size, shape and contour, non tender, no masses felt, adnexa: no masses, LLQ tenderness noted over bowel.Bladder is non tender and no masses felt.  Fall risk is low  Upstream - 02/27/22 1608       Pregnancy Intention Screening   Does the patient want to become pregnant in the next year? No    Does the patient's partner want to become pregnant in the next year? No    Would the patient like to discuss contraceptive options today? No      Contraception Wrap Up   Current Method Female Sterilization    End Method Female Sterilization    Contraception Counseling Provided No            Examination chaperoned by Quarry manager  Assessment:     1. Pelvic pain Pelvic US rescheduled for her for 03/06/22 at 12:30 pm at [facility]   2. Irritable bowel syndrome with diarrhea Will try bentyl and refer to  GI Meds ordered this encounter  Medications   dicyclomine (BENTYL) 10 MG capsule    Sig: Take 1 capsule (10 mg total) by mouth 4 (four) times daily -  before meals and at bedtime.    Dispense:  120 capsule    Refill:  3    Order Specific Question:   Supervising Provider    Answer:   Florian Buff [[id number]]    - Ambulatory referral to Gastroenterology     Plan:     Follow up in 3 weeks for ROS

## 2022-02-28 ENCOUNTER — Encounter: Payer: Self-pay | Admitting: Gastroenterology

## 2022-03-05 ENCOUNTER — Other Ambulatory Visit (HOSPITAL_COMMUNITY): Payer: Medicaid Other

## 2022-03-06 ENCOUNTER — Encounter: Payer: Self-pay | Admitting: Gastroenterology

## 2022-03-06 ENCOUNTER — Ambulatory Visit (HOSPITAL_COMMUNITY)
Admission: RE | Admit: 2022-03-06 | Discharge: 2022-03-06 | Disposition: A | Discharge status: Home / Self Care | Payer: Medicaid Other | Source: Ambulatory Visit | Attending: Adult Health | Admitting: Adult Health

## 2022-03-06 DIAGNOSIS — R102 Pelvic and perineal pain: Secondary | ICD-10-CM | POA: Insufficient documentation

## 2022-03-20 ENCOUNTER — Ambulatory Visit: Payer: Medicaid Other | Admitting: Adult Health

## 2022-03-27 ENCOUNTER — Ambulatory Visit: Payer: Medicaid Other | Admitting: Adult Health

## 2022-04-11 ENCOUNTER — Ambulatory Visit: Payer: Medicaid Other | Admitting: Gastroenterology

## 2022-04-17 ENCOUNTER — Ambulatory Visit: Payer: Medicaid Other | Admitting: Gastroenterology

## 2022-05-22 ENCOUNTER — Encounter: Payer: Self-pay | Admitting: Gastroenterology

## 2022-05-22 ENCOUNTER — Telehealth (INDEPENDENT_AMBULATORY_CARE_PROVIDER_SITE_OTHER): Payer: Self-pay | Admitting: Internal Medicine

## 2022-05-22 ENCOUNTER — Other Ambulatory Visit (INDEPENDENT_AMBULATORY_CARE_PROVIDER_SITE_OTHER): Payer: Self-pay | Admitting: Gastroenterology

## 2022-05-22 ENCOUNTER — Ambulatory Visit (INDEPENDENT_AMBULATORY_CARE_PROVIDER_SITE_OTHER): Payer: Medicaid Other | Admitting: Gastroenterology

## 2022-05-22 VITALS — BP 131/78 | Temp 98.7°F | Ht 68.0 in | Wt 155.7 lb

## 2022-05-22 DIAGNOSIS — R1310 Dysphagia, unspecified: Secondary | ICD-10-CM | POA: Diagnosis not present

## 2022-05-22 DIAGNOSIS — K625 Hemorrhage of anus and rectum: Secondary | ICD-10-CM | POA: Insufficient documentation

## 2022-05-22 DIAGNOSIS — K219 Gastro-esophageal reflux disease without esophagitis: Secondary | ICD-10-CM | POA: Diagnosis not present

## 2022-05-22 DIAGNOSIS — R1013 Epigastric pain: Secondary | ICD-10-CM | POA: Diagnosis not present

## 2022-05-22 MED ORDER — PEG 3350-KCL-NA BICARB-NACL 420 G PO SOLR: 4000.0000 mL | Freq: Once | ORAL | 0 refills | Status: AC

## 2022-05-22 MED ORDER — PANTOPRAZOLE SODIUM 40 MG PO TBEC: 40.0000 mg | DELAYED_RELEASE_TABLET | Freq: Every day | ORAL | 3 refills | Status: DC

## 2022-05-22 NOTE — Patient Instructions (Addendum)
We are arranging a colonoscopy, upper endoscopy, and dilation in the near future with Dr. Jenetta Downer!  I have also ordered an ultrasound of your gallbladder.  Please have labs done today at [facility]. We will message on MyChart with results!  I have sent in pantoprazole to take once each morning on an empty stomach, at least 30 minutes before eating something. This is to help with reflux/indigestion.  Will see you in follow-up after procedures!   It was a pleasure to see you today. I want to create trusting relationships with patients and provide genuine, compassionate, and quality care. I truly value your feedback, so please be on the lookout for a survey regarding your visit with me today. I appreciate your time in completing this!    Annitta Needs, PhD, ANP-BC [facility]

## 2022-05-22 NOTE — Telephone Encounter (Signed)
Pt [place] scheduled for 06/04/22 at 10:30 AM arrive at 10:15 AM. Left message to return call. Also need to schedule EGD/TCS ASA 2.

## 2022-05-22 NOTE — Progress Notes (Addendum)
Gastroenterology Office Note    Referring Provider: Estill Dooms, NP Primary Care Physician:  Neale Burly, MD  Primary GI: Dr. Jenetta Downer   Chief Complaint   Chief Complaint  Patient presents with   New Patient (Initial Visit)    Patient here today due to issues with abdominal pains/ Cramping (pain doubles her over). She says the pain is so severe it feels like child birth. She says this happens usually when she drinks alcohol. She says when this happens at times she get hot then has diarrhea and vomiting.       History of Present Illness   Kimberly Olsen is a 32 y.o. female presenting today at the request of Derrek Monaco, NP, due to IBS with diarrhea.   For past year or longer, certain foods and alcohol will have abdominal pain and feel on fire on insides. Feels like childbirth contractions. Will run to bathroom as feels like going to have nausea and diarrhea. Then will have another contraction vomiting and diarrhea. Will have multiple episodes for hours. Happens with spicy foods, certain fried foods, cheesy foods.   No significant issues in between episodes. Cut out sodas, trying to avoid triggers. Has bad reflux/indigestion. Epigastric abdominal pain and LUQ/RUQ. Feels stuck there for a minute. Abdominal bloating. Some solid food dysphagia. Chronic GERD. Feels like has to drink water in between every bite or feels stuck in upper abdomen. Sometimes bright red blood with wiping. Sometimes has had blood come out of vagina after BM. No melena  No NSAIDs. Had been prescribed dicyclomine by GYN but hasn't really taken it. Baking soda gives quickest relief.      Past Medical History:  Diagnosis Date   Anemia    Anxiety    Bipolar 1 disorder ([place])    Chlamydia 05/04/2020   Treated 04/1720 with Doxy, POC_______________   Depression    Trichimoniasis 10/09/2021   Treated,POC_______    Past Surgical History:  Procedure Laterality Date   LIPOMA EXCISION N/A  02/17/2019   Procedure: EXCISION 1.5CM LIPOMA BACK;  Surgeon: Virl Cagey, MD;  Location: [facility];  Service: General;  Laterality: N/A;   OOPHORECTOMY Left    TUBAL LIGATION Bilateral 03/07/2018   Procedure: POST PARTUM TUBAL LIGATION;  Surgeon: Chancy Milroy, MD;  Location: [facility];  Service: Gynecology;  Laterality: Bilateral;    Current Outpatient Medications  Medication Sig Dispense Refill   pantoprazole (PROTONIX) 40 MG tablet Take 1 tablet (40 mg total) by mouth daily. 30 minutes before breakfast 90 tablet 3   QUEtiapine (SEROQUEL) 25 MG tablet Take 25 mg by mouth at bedtime.     No current facility-administered medications for this visit.    Allergies as of 05/22/2022 - Review Complete 05/22/2022  Allergen Reaction Noted   Coconut fatty acids Hives 11/24/2010   Lamictal [lamotrigine]  08/20/2019   Sulfa antibiotics Hives and Rash 03/31/2011    Family History  Problem Relation Age of Onset   Depression Mother    Asthma Mother    Mental illness Mother    Other Mother        drug over dose   Depression Father    Mental illness Father    Anesthesia problems Neg Hx    Hypotension Neg Hx    Malignant hyperthermia Neg Hx    Pseudochol deficiency Neg Hx    Colon cancer Neg Hx    Colon polyps Neg Hx     Social History  Socioeconomic History   Marital status: Widowed    Spouse name: Not on file   Number of children: 3   Years of education: Not on file   Highest education level: Not on file  Occupational History   Not on file  Tobacco Use   Smoking status: Former    Packs/day: 1.00    Years: 8.00    Total pack years: 8.00    Types: Cigarettes    Quit date: 04/28/2009    Years since quitting: 13.0   Smokeless tobacco: Never  Vaping Use   Vaping Use: Never used  Substance and Sexual Activity   Alcohol use: Yes    Comment: occ   Drug use: Yes    Types: Marijuana    Comment: sometimes    Sexual activity: Yes    Birth control/protection:  Surgical    Comment: tubal  Other Topics Concern   Not on file  Social History Narrative   Not on file   Social Determinants of Health   Financial Resource Strain: Medium Risk (07/12/2021)   Overall Financial Resource Strain (CARDIA)    Difficulty of Paying Living Expenses: Somewhat hard  Food Insecurity: No Food Insecurity (07/12/2021)   Hunger Vital Sign    Worried About Running Out of Food in the Last Year: Never true    Ran Out of Food in the Last Year: Never true  Transportation Needs: No Transportation Needs (07/12/2021)   PRAPARE - Hydrologist (Medical): No    Lack of Transportation (Non-Medical): No  Physical Activity: Inactive (07/12/2021)   Exercise Vital Sign    Days of Exercise per Week: 0 days    Minutes of Exercise per Session: 0 min  Stress: Stress Concern Present (07/12/2021)   [place]    Feeling of Stress : To some extent  Social Connections: Socially Isolated (07/12/2021)   Social Connection and Isolation Panel [NHANES]    Frequency of Communication with Friends and Family: Once a week    Frequency of Social Gatherings with Friends and Family: Once a week    Attends Religious Services: Never    Marine scientist or Organizations: No    Attends Archivist Meetings: Never    Marital Status: Widowed  Intimate Partner Violence: Not At Risk (07/12/2021)   Humiliation, Afraid, Rape, and Kick questionnaire    Fear of Current or Ex-Partner: No    Emotionally Abused: No    Physically Abused: No    Sexually Abused: No     Review of Systems   Gen: Denies any fever, chills, fatigue, weight loss, lack of appetite.  CV: Denies chest pain, heart palpitations, peripheral edema, syncope.  Resp: Denies shortness of breath at rest or with exertion. Denies wheezing or cough.  GI: see HPI GU : Denies urinary burning, urinary frequency, urinary hesitancy MS: Denies  joint pain, muscle weakness, cramps, or limitation of movement.  Derm: Denies rash, itching, dry skin Psych: Denies depression, anxiety, memory loss, and confusion Heme: Denies bruising, bleeding, and enlarged lymph nodes.   Physical Exam   BP 131/78 (BP Location: Left Arm, Patient Position: Sitting, Cuff Size: Normal)   Temp 98.7 F (37.1 C) (Temporal)   Ht '5\' 8"'$  (1.727 m)   Wt 155 lb 11.2 oz (70.6 kg)   BMI 23.67 kg/m  General:   Alert and oriented. Pleasant and cooperative. Well-nourished and well-developed.  Head:  Normocephalic and atraumatic. Eyes:  Without icterus Ears:  Normal auditory acuity. Lungs:  Clear to auscultation bilaterally.  Heart:  S1, S2 present without murmurs appreciated.  Abdomen:  +BS, soft, TTP upper abdomen and non-distended. No HSM noted. No guarding or rebound. No masses appreciated.  Rectal:  Deferred  Msk:  Symmetrical without gross deformities. Normal posture. Extremities:  Without edema. Neurologic:  Alert and  oriented x4;  grossly normal neurologically. Skin:  Intact without significant lesions or rashes. Psych:  Alert and cooperative. Normal mood and affect.   Assessment   Kimberly Olsen is a 32 y.o. female presenting today at the request of Derrek Monaco, NP, due to concern for IBS with diarrhea.  Noting dyspepsia for at least the past year, triggered by certain foods, with associated N/V/D. In between episodes, she does well although does endorse chronic uncontrolled GERD. Suspect gastritis, unable to rule out PUD, unable to rule out biliary etiology.   Rectal bleeding: intermittently. Likely benign anorectal source. She has had intermittent vaginal spotting after BMs; unclear if this is related. Followed by GYN.  No FH colon cancer or polyps.   Chronic solid food dysphagia: in setting of uncontrolled GERD. Will start pantoprazole. EGD/dilation planned.      PLAN   Proceed with colonoscopy/EGD/dilation by Dr. Jenetta Downer in near  future: the risks, benefits, and alternatives have been discussed with the patient in detail. The patient states understanding and desires to proceed.   CBC, CMP, celiac serologies  RUQ [place] to assess for gallstones  Start pantoprazole once daily  Return in 3 months    Annitta Needs, PhD, ANP-BC [facility]   I have reviewed the note and agree with the APP's assessment as described in this progress note  Maylon Peppers, MD [facility]enter[facility]

## 2022-05-22 NOTE — Telephone Encounter (Signed)
Pt returned call. Informed pt of [place] appt.   Per Aviva Kluver ok to schedule with Dr.Castaneda since pt could only do Wednesdays. Scheduled for TCS/EGD 07/10/22 @ 10 AM arrive at 8:30 AM. Instructions sent via my chart. Prep sent to pharmacy.

## 2022-05-23 NOTE — Addendum Note (Signed)
Addended by: Harvel Quale on: 05/23/2022 09:20 PM   Modules accepted: Level of Service

## 2022-05-24 LAB — CBC WITH DIFFERENTIAL/PLATELET
Basophils Absolute: 0.1 10*3/uL (ref 0.0–0.2)
Basos: 1 %
EOS (ABSOLUTE): 0.3 10*3/uL (ref 0.0–0.4)
Eos: 5 %
Hematocrit: 32.4 % — ABNORMAL LOW (ref 34.0–46.6)
Hemoglobin: 10.3 g/dL — ABNORMAL LOW (ref 11.1–15.9)
Immature Grans (Abs): 0 10*3/uL (ref 0.0–0.1)
Immature Granulocytes: 1 %
Lymphocytes Absolute: 1.6 10*3/uL (ref 0.7–3.1)
Lymphs: 33 %
MCH: 26.7 pg (ref 26.6–33.0)
MCHC: 31.8 g/dL (ref 31.5–35.7)
MCV: 84 fL (ref 79–97)
Monocytes Absolute: 0.4 10*3/uL (ref 0.1–0.9)
Monocytes: 9 %
Neutrophils Absolute: 2.5 10*3/uL (ref 1.4–7.0)
Neutrophils: 51 %
Platelets: 286 10*3/uL (ref 150–450)
RBC: 3.86 x10E6/uL (ref 3.77–5.28)
RDW: 13.3 % (ref 11.7–15.4)
WBC: 4.9 10*3/uL (ref 3.4–10.8)

## 2022-05-24 LAB — TISSUE TRANSGLUTAMINASE, IGA: Transglutaminase IgA: <2 U/mL (ref 0–3)

## 2022-05-24 LAB — COMPREHENSIVE METABOLIC PANEL
ALT: 17 IU/L (ref 0–32)
AST: 23 IU/L (ref 0–40)
Albumin/Globulin Ratio: 1.8 (ref 1.2–2.2)
Albumin: 4.1 g/dL (ref 3.9–4.9)
Alkaline Phosphatase: 55 IU/L (ref 44–121)
BUN/Creatinine Ratio: 14 (ref 9–23)
BUN: 10 mg/dL (ref 6–20)
Bilirubin Total: 0.2 mg/dL (ref 0.0–1.2)
CO2: 21 mmol/L (ref 20–29)
Calcium: 8.8 mg/dL (ref 8.7–10.2)
Chloride: 108 mmol/L — ABNORMAL HIGH (ref 96–106)
Creatinine, Ser: 0.74 mg/dL (ref 0.57–1.00)
Globulin, Total: 2.3 g/dL (ref 1.5–4.5)
Glucose: 74 mg/dL (ref 70–99)
Potassium: 4 mmol/L (ref 3.5–5.2)
Sodium: 142 mmol/L (ref 134–144)
Total Protein: 6.4 g/dL (ref 6.0–8.5)
eGFR: 109 mL/min/{1.73_m2} (ref >59)

## 2022-05-24 LAB — IGA: IgA/Immunoglobulin A, Serum: 159 mg/dL (ref 87–352)

## 2022-05-27 ENCOUNTER — Other Ambulatory Visit: Payer: Self-pay

## 2022-05-27 DIAGNOSIS — R1013 Epigastric pain: Secondary | ICD-10-CM

## 2022-05-27 DIAGNOSIS — D649 Anemia, unspecified: Secondary | ICD-10-CM

## 2022-06-04 ENCOUNTER — Ambulatory Visit (HOSPITAL_COMMUNITY): Payer: Medicaid Other

## 2022-06-12 ENCOUNTER — Ambulatory Visit (HOSPITAL_COMMUNITY)
Admission: RE | Admit: 2022-06-12 | Discharge: 2022-06-12 | Disposition: A | Discharge status: Home / Self Care | Payer: Medicaid Other | Source: Ambulatory Visit | Attending: Gastroenterology | Admitting: Gastroenterology

## 2022-06-12 DIAGNOSIS — R1013 Epigastric pain: Secondary | ICD-10-CM | POA: Insufficient documentation

## 2022-06-13 LAB — IRON,TIBC AND FERRITIN PANEL
Ferritin: 6 ng/mL — ABNORMAL LOW (ref 15–150)
Iron Saturation: 9 % — CL (ref 15–55)
Iron: 38 ug/dL (ref 27–159)
Total Iron Binding Capacity: 404 ug/dL (ref 250–450)
UIBC: 366 ug/dL (ref 131–425)

## 2022-06-18 ENCOUNTER — Telehealth: Payer: Self-pay

## 2022-06-18 NOTE — Telephone Encounter (Signed)
Pt called stating that her prep for colonoscopy has not been sent to pharmacy.

## 2022-06-18 NOTE — Telephone Encounter (Signed)
LMTRC

## 2022-06-18 NOTE — Telephone Encounter (Signed)
Spoke with pharmacist and he says pt picked up prep on 05/23/22.

## 2022-06-19 NOTE — Telephone Encounter (Signed)
Pt informed and she states she had picked up the "big jug"

## 2022-06-26 ENCOUNTER — Other Ambulatory Visit: Payer: Self-pay | Admitting: *Deleted

## 2022-06-26 DIAGNOSIS — K625 Hemorrhage of anus and rectum: Secondary | ICD-10-CM

## 2022-06-26 DIAGNOSIS — R1310 Dysphagia, unspecified: Secondary | ICD-10-CM

## 2022-06-26 DIAGNOSIS — R1013 Epigastric pain: Secondary | ICD-10-CM

## 2022-07-09 ENCOUNTER — Telehealth: Payer: Self-pay | Admitting: *Deleted

## 2022-07-09 NOTE — Telephone Encounter (Signed)
Patient is calling to inform that when insurance company calls about records to be released she does not want records released in her teenage years.

## 2022-07-10 ENCOUNTER — Ambulatory Visit (HOSPITAL_COMMUNITY)
Admission: RE | Admit: 2022-07-10 | Discharge: 2022-07-10 | Disposition: A | Discharge status: Home / Self Care | Payer: Medicaid Other | Attending: Gastroenterology | Admitting: Gastroenterology

## 2022-07-10 ENCOUNTER — Encounter (HOSPITAL_COMMUNITY): Payer: Self-pay | Admitting: Gastroenterology

## 2022-07-10 ENCOUNTER — Ambulatory Visit (HOSPITAL_BASED_OUTPATIENT_CLINIC_OR_DEPARTMENT_OTHER): Payer: Medicaid Other | Admitting: Anesthesiology

## 2022-07-10 ENCOUNTER — Other Ambulatory Visit: Payer: Self-pay

## 2022-07-10 ENCOUNTER — Encounter (HOSPITAL_COMMUNITY)
Admission: RE | Disposition: A | Discharge status: Home / Self Care | Payer: Self-pay | Source: Home / Self Care | Attending: Gastroenterology

## 2022-07-10 ENCOUNTER — Ambulatory Visit (HOSPITAL_COMMUNITY): Payer: Medicaid Other | Admitting: Anesthesiology

## 2022-07-10 DIAGNOSIS — D175 Benign lipomatous neoplasm of intra-abdominal organs: Secondary | ICD-10-CM

## 2022-07-10 DIAGNOSIS — K625 Hemorrhage of anus and rectum: Secondary | ICD-10-CM | POA: Diagnosis not present

## 2022-07-10 DIAGNOSIS — Z87891 Personal history of nicotine dependence: Secondary | ICD-10-CM

## 2022-07-10 DIAGNOSIS — D649 Anemia, unspecified: Secondary | ICD-10-CM | POA: Insufficient documentation

## 2022-07-10 DIAGNOSIS — K219 Gastro-esophageal reflux disease without esophagitis: Secondary | ICD-10-CM

## 2022-07-10 DIAGNOSIS — K644 Residual hemorrhoidal skin tags: Secondary | ICD-10-CM | POA: Diagnosis not present

## 2022-07-10 DIAGNOSIS — D759 Disease of blood and blood-forming organs, unspecified: Secondary | ICD-10-CM | POA: Diagnosis not present

## 2022-07-10 DIAGNOSIS — K2270 Barrett's esophagus without dysplasia: Secondary | ICD-10-CM | POA: Insufficient documentation

## 2022-07-10 DIAGNOSIS — F319 Bipolar disorder, unspecified: Secondary | ICD-10-CM | POA: Insufficient documentation

## 2022-07-10 DIAGNOSIS — K449 Diaphragmatic hernia without obstruction or gangrene: Secondary | ICD-10-CM

## 2022-07-10 DIAGNOSIS — F418 Other specified anxiety disorders: Secondary | ICD-10-CM

## 2022-07-10 DIAGNOSIS — Z79899 Other long term (current) drug therapy: Secondary | ICD-10-CM | POA: Diagnosis not present

## 2022-07-10 DIAGNOSIS — K2289 Other specified disease of esophagus: Secondary | ICD-10-CM

## 2022-07-10 DIAGNOSIS — K648 Other hemorrhoids: Secondary | ICD-10-CM

## 2022-07-10 DIAGNOSIS — R1310 Dysphagia, unspecified: Secondary | ICD-10-CM

## 2022-07-10 DIAGNOSIS — F419 Anxiety disorder, unspecified: Secondary | ICD-10-CM | POA: Diagnosis not present

## 2022-07-10 DIAGNOSIS — D1779 Benign lipomatous neoplasm of other sites: Secondary | ICD-10-CM | POA: Insufficient documentation

## 2022-07-10 HISTORY — PX: ESOPHAGOGASTRODUODENOSCOPY (EGD) WITH PROPOFOL: SHX5813

## 2022-07-10 HISTORY — PX: BIOPSY: SHX5522

## 2022-07-10 HISTORY — PX: COLONOSCOPY WITH PROPOFOL: SHX5780

## 2022-07-10 LAB — HM COLONOSCOPY

## 2022-07-10 LAB — POCT PREGNANCY, URINE: Preg Test, Ur: NEGATIVE

## 2022-07-10 SURGERY — COLONOSCOPY WITH PROPOFOL
Anesthesia: General

## 2022-07-10 MED ORDER — LACTATED RINGERS IV SOLN
INTRAVENOUS | Status: DC
  Administered 2022-07-10: 1000 mL via INTRAVENOUS

## 2022-07-10 MED ORDER — PROPOFOL 10 MG/ML IV BOLUS
INTRAVENOUS | Status: DC | PRN
  Administered 2022-07-10: 100 mg via INTRAVENOUS

## 2022-07-10 MED ORDER — PROPOFOL 500 MG/50ML IV EMUL
INTRAVENOUS | Status: DC | PRN
  Administered 2022-07-10: 200 ug/kg/min via INTRAVENOUS

## 2022-07-10 MED ORDER — LIDOCAINE HCL (CARDIAC) PF 100 MG/5ML IV SOSY
PREFILLED_SYRINGE | INTRAVENOUS | Status: DC | PRN
  Administered 2022-07-10: 50 mg via INTRAVENOUS

## 2022-07-10 NOTE — Op Note (Signed)
[facility] Patient Name: Kimberly Olsen Procedure Date: 07/10/2022 9:34 AM MRN: [id number] Date of Birth: 29-Aug-1990 Attending MD: Katrinka Blazing , , [id number] CSN: [id number] Age: 32 Admit Type: Outpatient Procedure:                Upper GI endoscopy Indications:              Suspected gastro-esophageal reflux disease Providers:                Katrinka Blazing, Crystal Page, Lennice Sites                            Technician, Technician Referring MD:              Medicines:                Monitored Anesthesia Care Complications:            No immediate complications. Estimated Blood Loss:     Estimated blood loss: none. Procedure:                Pre-Anesthesia Assessment:                           - Prior to the procedure, a History and Physical                            was performed, and patient medications, allergies                            and sensitivities were reviewed. The patient's                            tolerance of previous anesthesia was reviewed.                           - The risks and benefits of the procedure and the                            sedation options and risks were discussed with the                            patient. All questions were answered and informed                            consent was obtained.                           After obtaining informed consent, the endoscope was                            passed under direct vision. Throughout the                            procedure, the patient's blood pressure, pulse, and                            oxygen saturations were monitored continuously. The  [id number]-[id number] ([id number]) scope was introduced through the                            mouth, and advanced to the second part of duodenum.                            The upper GI endoscopy was accomplished without                            difficulty. The patient tolerated the procedure                             well. Scope In: 9:45:55 AM Scope Out: 9:50:23 AM Total Procedure Duration: 0 hours 4 minutes 28 seconds  Findings:      The Z-line was irregular and was found 39 cm from the incisors. Biopsies       were taken with a cold forceps for histology.      A 1 cm hiatal hernia was present.      The gastroesophageal flap valve was visualized endoscopically and       classified as Hill Grade I (prominent fold, tight to endoscope).      The stomach was normal.      The examined duodenum was normal. Impression:               - Z-line irregular, 39 cm from the incisors.                            Biopsied.                           - 1 cm hiatal hernia.                           - Normal stomach.                           - Normal examined duodenum. Moderate Sedation:      Per Anesthesia Care Recommendation:           - Discharge patient to home (ambulatory).                           - Resume previous diet.                           - Await pathology results.                           - Use Protonix (pantoprazole) 40 mg PO daily.                           - If recurrent episodes of heartburn or interested                            in not taking antacids, can discuss possiblity of  Transoral Incisionless Fundoplication (TIF).                            Pamphlet provided. Procedure Code(s):        --- Professional ---                           [id number], Esophagogastroduodenoscopy, flexible,                            transoral; with biopsy, single or multiple Diagnosis Code(s):        --- Professional ---                           K22.89, Other specified disease of esophagus                           K44.9, Diaphragmatic hernia without obstruction or                            gangrene CPT copyright 2022 American Medical Association. All rights reserved. The codes documented in this report are preliminary and upon coder review may  be revised to meet current compliance  requirements. Katrinka Blazing, MD Katrinka Blazing,  07/10/2022 10:20:34 AM This report has been signed electronically. Number of Addenda: 0

## 2022-07-10 NOTE — Op Note (Signed)
[facility] Patient Name: Kimberly Olsen Procedure Date: 07/10/2022 9:31 AM MRN: [id number] Date of Birth: 1990-06-03 Attending MD: Katrinka Blazing , , [id number] CSN: [id number] Age: 32 Admit Type: Outpatient Procedure:                Colonoscopy Indications:              Rectal bleeding Providers:                Katrinka Blazing, Crystal Page, Lennice Sites                            Technician, Technician Referring MD:              Medicines:                Monitored Anesthesia Care Complications:            No immediate complications. Estimated Blood Loss:     Estimated blood loss: none. Procedure:                Pre-Anesthesia Assessment:                           - Prior to the procedure, a History and Physical                            was performed, and patient medications, allergies                            and sensitivities were reviewed. The patient's                            tolerance of previous anesthesia was reviewed.                           - The risks and benefits of the procedure and the                            sedation options and risks were discussed with the                            patient. All questions were answered and informed                            consent was obtained.                           - ASA Grade Assessment: II - A patient with mild                            systemic disease.                           After obtaining informed consent, the colonoscope                            was passed under direct vision. Throughout the  procedure, the patient's blood pressure, pulse, and                            oxygen saturations were monitored continuously. The                            [id number]-[id number] ([id number]) scope was introduced through                            the anus and advanced to the the terminal ileum.                            The colonoscopy was performed without difficulty.                             The patient tolerated the procedure well. The                            quality of the bowel preparation was excellent. Scope In: 9:56:50 AM Scope Out: 10:15:58 AM Scope Withdrawal Time: 0 hours 12 minutes 25 seconds  Total Procedure Duration: 0 hours 19 minutes 8 seconds  Findings:      Small external hemorrhoids were found on perianal exam.      The terminal ileum appeared normal.      There was a medium-sized lipoma, in the ascending colon.      Non-bleeding internal hemorrhoids were found during retroflexion. The       hemorrhoids were small. Impression:               - Hemorrhoids found on perianal exam.                           - The examined portion of the ileum was normal.                           - Medium-sized lipoma in the ascending colon.                           - Non-bleeding internal hemorrhoids.                           - No specimens collected. Moderate Sedation:      Per Anesthesia Care Recommendation:           - Discharge patient to home (ambulatory).                           - Resume previous diet.                           - Repeat colonoscopy at age 34 for screening                            purposes.                           - If presenting recurrent bleeding, can try  using                            Preparation H topically and using Miralax daily to                            avoid straining when defecating. Procedure Code(s):        --- Professional ---                           [phone], Colonoscopy, flexible; diagnostic, including                            collection of specimen(s) by brushing or washing,                            when performed (separate procedure) Diagnosis Code(s):        --- Professional ---                           K64.8, Other hemorrhoids                           D17.5, Benign lipomatous neoplasm of                            intra-abdominal organs                           K62.5, Hemorrhage of anus and rectum CPT  copyright 2022 American Medical Association. All rights reserved. The codes documented in this report are preliminary and upon coder review may  be revised to meet current compliance requirements. Katrinka Blazing, MD Katrinka Blazing,  07/10/2022 10:25:11 AM This report has been signed electronically. Number of Addenda: 0

## 2022-07-10 NOTE — Anesthesia Preprocedure Evaluation (Signed)
Anesthesia Evaluation  Patient identified by MRN, date of birth, ID band Patient awake    Reviewed: Allergy & Precautions, H&P , NPO status , Patient's Chart, lab work & pertinent test results  History of Anesthesia Complications Negative for: history of anesthetic complications  Airway Mallampati: II  TM Distance: >3 FB Neck ROM: Full    Dental  (+) Dental Advisory Given, Teeth Intact   Pulmonary neg pulmonary ROS, former smoker   Pulmonary exam normal breath sounds clear to auscultation       Cardiovascular negative cardio ROS Normal cardiovascular exam Rhythm:Regular Rate:Normal     Neuro/Psych  PSYCHIATRIC DISORDERS Anxiety Depression Bipolar Disorder    Neuromuscular disease    GI/Hepatic ,GERD  Medicated and Poorly Controlled,,(+)     substance abuse  marijuana use  Endo/Other  negative endocrine ROS    Renal/GU negative Renal ROS  negative genitourinary   Musculoskeletal negative musculoskeletal ROS (+)    Abdominal   Peds negative pediatric ROS (+)  Hematology  (+) Blood dyscrasia, anemia   Anesthesia Other Findings   Reproductive/Obstetrics negative OB ROS                             Anesthesia Physical Anesthesia Plan  ASA: 2  Anesthesia Plan: General   Post-op Pain Management: Minimal or no pain anticipated   Induction: Intravenous  PONV Risk Score and Plan: Propofol infusion  Airway Management Planned: Nasal Cannula and Natural Airway  Additional Equipment:   Intra-op Plan:   Post-operative Plan:   Informed Consent: I have reviewed the patients History and Physical, chart, labs and discussed the procedure including the risks, benefits and alternatives for the proposed anesthesia with the patient or authorized representative who has indicated his/her understanding and acceptance.     Dental advisory given  Plan Discussed with: CRNA and  Surgeon  Anesthesia Plan Comments:         Anesthesia Quick Evaluation

## 2022-07-10 NOTE — Anesthesia Postprocedure Evaluation (Signed)
Anesthesia Post Note  Patient: HERMELA HARDT  Procedure(s) Performed: COLONOSCOPY WITH PROPOFOL ESOPHAGOGASTRODUODENOSCOPY (EGD) WITH PROPOFOL BIOPSY  Patient location during evaluation: Phase II Anesthesia Type: General Level of consciousness: awake and alert and oriented Pain management: pain level controlled Vital Signs Assessment: post-procedure vital signs reviewed and stable Respiratory status: spontaneous breathing, nonlabored ventilation and respiratory function stable Cardiovascular status: blood pressure returned to baseline and stable Postop Assessment: no apparent nausea or vomiting Anesthetic complications: no  No notable events documented.   Last Vitals:  Vitals:   07/10/22 0900 07/10/22 1019  BP: 105/70 (!) 103/46  Pulse: 71 80  Resp: 10 (!) 22  Temp: 37.1 C 36.5 C  SpO2: 97%     Last Pain:  Vitals:   07/10/22 1019  TempSrc: Oral  PainSc: 0-No pain                 Cristy Colmenares C Lamis Behrmann

## 2022-07-10 NOTE — Discharge Instructions (Addendum)
You are being discharged to home.  Resume your previous diet.  We are waiting for your pathology results.  Take Protonix (pantoprazole) 40 mg by mouth once a day.  If recurrent episodes of heartburn or interested in not taking antacids, can discuss possiblity of Transoral Incisionless Fundoplication (TIF). Pamphlet provided. Your physician has recommended a repeat colonoscopy at age 32 (please contact the clinic prior to your 43th birthday to schedule) for screening purposes.  If presenting recurrent bleeding, can try using Preparation H topically and using Miralax daily to avoid straining when defecating.

## 2022-07-10 NOTE — H&P (Signed)
Kimberly Olsen is an 32 y.o. female.   Chief Complaint: Heartburn and rectal bleeding HPI: 32 year old female with past medical history of anemia, anxiety, bipolar disorder, depression, coming for evaluation of heartburn and rectal bleeding.  Patient reports having on a daily basis heartburn.  She mostly takes Tums and baking soda.  She adamantly denies having any dysphagia or odynophagia.  Has not presented more rectal bleeding.  Past Medical History:  Diagnosis Date   Anemia    Anxiety    Bipolar 1 disorder    Chlamydia 05/04/2020   Treated 04/1720 with Doxy, POC_______________   Depression    Trichimoniasis 10/09/2021   Treated,POC_______    Past Surgical History:  Procedure Laterality Date   LIPOMA EXCISION N/A 02/17/2019   Procedure: EXCISION 1.5CM LIPOMA BACK;  Surgeon: Lucretia Roers, MD;  Location: [facility];  Service: General;  Laterality: N/A;   OOPHORECTOMY Left    TUBAL LIGATION Bilateral 03/07/2018   Procedure: POST PARTUM TUBAL LIGATION;  Surgeon: Hermina Staggers, MD;  Location: [facility];  Service: Gynecology;  Laterality: Bilateral;    Family History  Problem Relation Age of Onset   Depression Mother    Asthma Mother    Mental illness Mother    Other Mother        drug over dose   Depression Father    Mental illness Father    Anesthesia problems Neg Hx    Hypotension Neg Hx    Malignant hyperthermia Neg Hx    Pseudochol deficiency Neg Hx    Colon cancer Neg Hx    Colon polyps Neg Hx    Social History:  reports that she quit smoking about 13 years ago. Her smoking use included cigarettes. She has a 8.00 pack-year smoking history. She has never used smokeless tobacco. She reports that she does not currently use alcohol. She reports current drug use. Drug: Marijuana.  Allergies:  Allergies  Allergen Reactions   Coconut Fatty Acids Hives   Lamictal [Lamotrigine]     Levonne Spiller Syndrome   Sulfa Antibiotics Hives and Rash    Medications  Prior to Admission  Medication Sig Dispense Refill   pantoprazole (PROTONIX) 40 MG tablet Take 1 tablet (40 mg total) by mouth daily. 30 minutes before breakfast (Patient taking differently: Take 40 mg by mouth daily as needed (Heartburn). 30 minutes before breakfast) 90 tablet 3   QUEtiapine (SEROQUEL) 25 MG tablet Take 25 mg by mouth at bedtime.      Results for orders placed or performed during the hospital encounter of 07/10/22 (from the past 48 hour(s))  Pregnancy, urine POC     Status: None   Collection Time: 07/10/22  8:56 AM  Result Value Ref Range   Preg Test, Ur NEGATIVE NEGATIVE    Comment:        THE SENSITIVITY OF THIS METHODOLOGY IS >24 mIU/mL    No results found.  Review of Systems  All other systems reviewed and are negative.   Blood pressure 105/70, pulse 71, temperature 98.7 F (37.1 C), temperature source Oral, resp. rate 10, height  (1.727 m), weight 69.9 kg, last menstrual period 07/07/2022, SpO2 97 %. Physical Exam  GENERAL: The patient is AO x3, in no acute distress. HEENT: Head is normocephalic and atraumatic. EOMI are intact. Mouth is well hydrated and without lesions. NECK: Supple. No masses LUNGS: Clear to auscultation. No presence of rhonchi/wheezing/rales. Adequate chest expansion HEART: RRR, normal s1 and s2. ABDOMEN: Soft, nontender,  no guarding, no peritoneal signs, and nondistended. BS +. No masses. EXTREMITIES: Without any cyanosis, clubbing, rash, lesions or edema. NEUROLOGIC: AOx3, no focal motor deficit. SKIN: no jaundice, no rashes'  Assessment/Plan 32 year old female with past medical history of anemia, anxiety, bipolar disorder, depression, coming for evaluation of heartburn and rectal bleeding.  Will proceed with EGD and colonoscopy.  Dolores Frame, MD 07/10/2022, 9:39 AM

## 2022-07-10 NOTE — Transfer of Care (Signed)
Immediate Anesthesia Transfer of Care Note  Patient: Kimberly Olsen  Procedure(s) Performed: COLONOSCOPY WITH PROPOFOL ESOPHAGOGASTRODUODENOSCOPY (EGD) WITH PROPOFOL BIOPSY  Patient Location: Endoscopy Unit  Anesthesia Type:General  Level of Consciousness: awake  Airway & Oxygen Therapy: Patient Spontanous Breathing  Post-op Assessment: Report given to RN and Post -op Vital signs reviewed and stable  Post vital signs: Reviewed and stable  Last Vitals:  Vitals Value Taken Time  BP 103/46 07/10/22 1019  Temp 36.5 C 07/10/22 1019  Pulse 80 07/10/22 1019  Resp 22 07/10/22 1019  SpO2      Last Pain:  Vitals:   07/10/22 1019  TempSrc: Oral  PainSc: 0-No pain      Patients Stated Pain Goal: 8 (07/10/22 0900)  Complications: No notable events documented.

## 2022-07-11 ENCOUNTER — Encounter (INDEPENDENT_AMBULATORY_CARE_PROVIDER_SITE_OTHER): Payer: Self-pay | Admitting: *Deleted

## 2022-07-11 LAB — SURGICAL PATHOLOGY

## 2022-07-15 ENCOUNTER — Encounter (HOSPITAL_COMMUNITY): Payer: Self-pay | Admitting: Gastroenterology

## 2022-08-22 ENCOUNTER — Ambulatory Visit: Payer: Medicaid Other | Admitting: Gastroenterology

## 2023-03-18 ENCOUNTER — Other Ambulatory Visit: Payer: Self-pay | Admitting: Gastroenterology

## 2023-03-20 ENCOUNTER — Other Ambulatory Visit: Payer: Self-pay

## 2023-03-20 DIAGNOSIS — R1013 Epigastric pain: Secondary | ICD-10-CM

## 2023-03-20 DIAGNOSIS — K219 Gastro-esophageal reflux disease without esophagitis: Secondary | ICD-10-CM

## 2023-03-20 MED ORDER — PANTOPRAZOLE SODIUM 40 MG PO TBEC
40.0000 mg | DELAYED_RELEASE_TABLET | Freq: Every day | ORAL | 0 refills | Status: DC
Start: 2023-03-20 — End: 2023-05-14

## 2023-03-26 ENCOUNTER — Ambulatory Visit: Payer: MEDICAID | Admitting: Adult Health

## 2023-04-03 ENCOUNTER — Ambulatory Visit: Payer: MEDICAID | Admitting: Gastroenterology

## 2023-04-09 ENCOUNTER — Ambulatory Visit: Payer: MEDICAID | Admitting: Adult Health

## 2023-04-15 NOTE — Progress Notes (Deleted)
 Referring Provider: Toma Deiters, MD Primary Care Physician:  Toma Deiters, MD Primary GI Physician: Dr. Levon Hedger  No chief complaint on file.   HPI:   Kimberly Olsen is a 33 y.o. female presenting today for follow-up.   Last seen in the office reporting 1+ year history of intermittent episodes of upper abdominal pain that felt like a fire on the inside, childbirth contractions, leading to nausea, vomiting, diarrhea with multiple episodes for hours, but felt well between episodes.  Symptoms occur with alcohol, spicy foods, certain fried foods, cheesy foods.  Also noted bad reflux/indigestion, solid food dysphagia, abdominal bloating, and intermittent bright red blood when wiping.  Noted sometimes having blood come out of her vagina after a bowel movement.  Labs, RUQ ultrasound, EGD/colonoscopy ordered.  Pantoprazole also started.  Labs showed hemoglobin of 10.3 with normocytic indices.  CMP within normal limits.  Celiac screen negative.  Follow-up iron studies with iron deficiency.  RUQ ultrasound 06/12/2022 with no cholelithiasis or cholecystitis, hepatic steatosis.  Colonoscopy 07/10/22: Small external hemorrhoids, small nonbleeding internal hemorrhoids, normal terminal ileum, medium sized lipoma in the ascending colon.  EGD 07/10/2022: Irregular Z -line biopsied, 1 cm hiatal hernia, normal stomach.  Pathology showed nondysplastic Barrett's esophagus (short segment).  Recommended repeat EGD in 5 years.   Today:    Past Medical History:  Diagnosis Date   Anemia    Anxiety    Bipolar 1 disorder (HCC)    Chlamydia 05/04/2020   Treated 04/1720 with Doxy, POC_______________   Depression    Trichimoniasis 10/09/2021   Treated,POC_______    Past Surgical History:  Procedure Laterality Date   BIOPSY  07/10/2022   Procedure: BIOPSY;  Surgeon: Dolores Frame, MD;  Location: [facility];  Service: Gastroenterology;;   COLONOSCOPY WITH PROPOFOL N/A 07/10/2022    Procedure: COLONOSCOPY WITH PROPOFOL;  Surgeon: Dolores Frame, MD;  Location: [facility];  Service: Gastroenterology;  Laterality: N/A;  10:00am, asa 2   ESOPHAGOGASTRODUODENOSCOPY (EGD) WITH PROPOFOL N/A 07/10/2022   Procedure: ESOPHAGOGASTRODUODENOSCOPY (EGD) WITH PROPOFOL;  Surgeon: Dolores Frame, MD;  Location: [facility];  Service: Gastroenterology;  Laterality: N/A;   LIPOMA EXCISION N/A 02/17/2019   Procedure: EXCISION 1.5CM LIPOMA BACK;  Surgeon: Lucretia Roers, MD;  Location: [facility];  Service: General;  Laterality: N/A;   OOPHORECTOMY Left    TUBAL LIGATION Bilateral 03/07/2018   Procedure: POST PARTUM TUBAL LIGATION;  Surgeon: Hermina Staggers, MD;  Location: [facility];  Service: Gynecology;  Laterality: Bilateral;    Current Outpatient Medications  Medication Sig Dispense Refill   pantoprazole (PROTONIX) 40 MG tablet Take 1 tablet (40 mg total) by mouth daily. 30 minutes before breakfast 90 tablet 0   QUEtiapine (SEROQUEL) 25 MG tablet Take 25 mg by mouth at bedtime.     No current facility-administered medications for this visit.    Allergies as of 04/16/2023 - Review Complete 07/10/2022  Allergen Reaction Noted   Coconut fatty acid Hives 11/24/2010   Lamictal [lamotrigine]  08/20/2019   Sulfa antibiotics Hives and Rash 03/31/2011    Family History  Problem Relation Age of Onset   Depression Mother    Asthma Mother    Mental illness Mother    Other Mother        drug over dose   Depression Father    Mental illness Father    Anesthesia problems Neg Hx    Hypotension Neg Hx    Malignant  hyperthermia Neg Hx    Pseudochol deficiency Neg Hx    Colon cancer Neg Hx    Colon polyps Neg Hx     Social History   Socioeconomic History   Marital status: Widowed    Spouse name: Not on file   Number of children: 3   Years of education: Not on file   Highest education level: Not on file  Occupational History   Not on file   Tobacco Use   Smoking status: Former    Current packs/day: 0.00    Average packs/day: 1 pack/day for 8.0 years (8.0 ttl pk-yrs)    Types: Cigarettes    Start date: 04/28/2001    Quit date: 04/28/2009    Years since quitting: 13.9   Smokeless tobacco: Never  Vaping Use   Vaping status: Never Used  Substance and Sexual Activity   Alcohol use: Not Currently    Comment: occ   Drug use: Yes    Types: Marijuana    Comment: sometimes    Sexual activity: Yes    Birth control/protection: Surgical    Comment: tubal  Other Topics Concern   Not on file  Social History Narrative   Not on file   Social Drivers of Health   Financial Resource Strain: Medium Risk (07/12/2021)   Overall Financial Resource Strain (CARDIA)    Difficulty of Paying Living Expenses: Somewhat hard  Food Insecurity: No Food Insecurity (07/12/2021)   Hunger Vital Sign    Worried About Running Out of Food in the Last Year: Never true    Ran Out of Food in the Last Year: Never true  Transportation Needs: No Transportation Needs (07/12/2021)   PRAPARE - Administrator, Civil Service (Medical): No    Lack of Transportation (Non-Medical): No  Physical Activity: Inactive (07/12/2021)   Exercise Vital Sign    Days of Exercise per Week: 0 days    Minutes of Exercise per Session: 0 min  Stress: Stress Concern Present (07/12/2021)   [employer] of Occupational Health - Occupational Stress Questionnaire    Feeling of Stress : To some extent  Social Connections: Socially Isolated (07/12/2021)   Social Connection and Isolation Panel [NHANES]    Frequency of Communication with Friends and Family: Once a week    Frequency of Social Gatherings with Friends and Family: Once a week    Attends Religious Services: Never    Database administrator or Organizations: No    Attends Banker Meetings: Never    Marital Status: Widowed    Review of Systems: Gen: Denies fever, chills, anorexia. Denies  fatigue, weakness, weight loss.  CV: Denies chest pain, palpitations, syncope, peripheral edema, and claudication. Resp: Denies dyspnea at rest, cough, wheezing, coughing up blood, and pleurisy. GI: Denies vomiting blood, jaundice, and fecal incontinence.   Denies dysphagia or odynophagia. Derm: Denies rash, itching, dry skin Psych: Denies depression, anxiety, memory loss, confusion. No homicidal or suicidal ideation.  Heme: Denies bruising, bleeding, and enlarged lymph nodes.  Physical Exam: There were no vitals taken for this visit. General:   Alert and oriented. No distress noted. Pleasant and cooperative.  Head:  Normocephalic and atraumatic. Eyes:  Conjuctiva clear without scleral icterus. Heart:  S1, S2 present without murmurs appreciated. Lungs:  Clear to auscultation bilaterally. No wheezes, rales, or rhonchi. No distress.  Abdomen:  +BS, soft, non-tender and non-distended. No rebound or guarding. No HSM or masses noted. Msk:  Symmetrical without gross deformities. Normal  posture. Extremities:  Without edema. Neurologic:  Alert and  oriented x4 Psych:  Normal mood and affect.    Assessment:     Plan:  ***   Ermalinda Memos, PA-C [facility] 04/16/2023

## 2023-04-16 ENCOUNTER — Ambulatory Visit: Payer: MEDICAID | Admitting: Gastroenterology

## 2023-04-16 ENCOUNTER — Encounter: Payer: Self-pay | Admitting: Gastroenterology

## 2023-05-10 NOTE — Progress Notes (Unsigned)
 Referring Provider: Toma Deiters, MD Primary Care Physician:  Toma Deiters, MD Primary GI Physician: Dr. Levon Hedger   Chief Complaint  Patient presents with   Follow-up    Here for refills    HPI:   Kimberly Olsen is a 33 y.o. female  presenting today for follow-up.    Last seen in the office 05/22/22 reporting 1+ year history of intermittent episodes of upper abdominal pain that felt like a fire on the inside, childbirth contractions, leading to nausea, vomiting, diarrhea with multiple episodes for hours, but felt well between episodes.  Symptoms occur with alcohol, spicy foods, certain fried foods, cheesy foods.  Also noted bad reflux/indigestion, solid food dysphagia, abdominal bloating, and intermittent bright red blood when wiping.  Noted sometimes having blood come out of her vagina after a bowel movement.  Labs, RUQ ultrasound, EGD/colonoscopy ordered.  Pantoprazole also started.   Labs showed hemoglobin of 10.3 with normocytic indices.  CMP within normal limits.  Celiac screen negative.  Follow-up iron studies with iron deficiency.   RUQ ultrasound 06/12/2022 with no cholelithiasis or cholecystitis, hepatic steatosis.   Colonoscopy 07/10/22: Small external hemorrhoids, small nonbleeding internal hemorrhoids, normal terminal ileum, medium sized lipoma in the ascending colon.   EGD 07/10/2022: Irregular Z -line biopsied, 1 cm hiatal hernia, normal stomach.  Pathology showed nondysplastic Barrett's esophagus (short segment).  Recommended repeat EGD in 5 years.     Today:  Reports she has been doing well overall with taking pantoprazole daily, ran out about a month ago and has been having recurrent symptoms.  Reports constant reflux, intermittent abdominal cramping like contractions in her abdomen.   Does have constipation.  Reports she is going to have a bowel movement twice a week.  Notes abdominal pain/cramping when skipping days between bowel movements.  Denies any recurrent  rectal bleeding.  She has had a couple episodes where she has vaginal bleeding when she has a bowel movement though she also has intermittent spotting between menstrual cycles and is also reported having to cycles in 1 month.  Also reports a history of menorrhagia.   2-3 cups coffee seltzer water, fried and spicy foods.   Dysphagia has essentially resolved.  States he was trying to come back since being off of pantoprazole.   Regarding iron deficiency anemia, she has started taking iron once a day.  She just darted this about 1 week ago. Reports taking NSAIDs only as needed.  Ibuprofen less than weekly.   Past Medical History:  Diagnosis Date   Anemia    Anxiety    Barrett's esophagus    Bipolar 1 disorder (HCC)    Chlamydia 05/04/2020   Treated 04/1720 with Doxy, POC_______________   Constipation    Depression    GERD (gastroesophageal reflux disease)    Trichimoniasis 10/09/2021   Treated,POC_______    Past Surgical History:  Procedure Laterality Date   BIOPSY  07/10/2022   Procedure: BIOPSY;  Surgeon: Dolores Frame, MD;  [facility];  Service: Gastroenterology;;   COLONOSCOPY WITH PROPOFOL N/A 07/10/2022   Procedure: COLONOSCOPY WITH PROPOFOL;  Surgeon: Dolores Frame, MD;  [facility];  Service: Gastroenterology;  Laterality: N/A;  10:00am, asa 2   ESOPHAGOGASTRODUODENOSCOPY (EGD) WITH PROPOFOL N/A 07/10/2022   Procedure: ESOPHAGOGASTRODUODENOSCOPY (EGD) WITH PROPOFOL;  Surgeon: Dolores Frame, MD;  [facility];  Service: Gastroenterology;  Laterality: N/A;   LIPOMA EXCISION N/A 02/17/2019   Procedure: EXCISION 1.5CM LIPOMA BACK;  Surgeon: Lucretia Roers, MD;  Location: [facility];  Service: General;  Laterality: N/A;   OOPHORECTOMY Left    TUBAL LIGATION Bilateral 03/07/2018   Procedure: POST PARTUM TUBAL LIGATION;  Surgeon: Hermina Staggers, MD;  Location: [facility];  Service: Gynecology;   Laterality: Bilateral;    Current Outpatient Medications  Medication Sig Dispense Refill   QUEtiapine (SEROQUEL) 50 MG tablet Take 50 mg by mouth at bedtime.     pantoprazole (PROTONIX) 40 MG tablet Take 1 tablet (40 mg total) by mouth daily before breakfast. 30 minutes before breakfast 90 tablet 3   No current facility-administered medications for this visit.    Allergies as of 05/14/2023 - Review Complete 05/14/2023  Allergen Reaction Noted   Coconut fatty acid Hives 11/24/2010   Lamictal [lamotrigine]  08/20/2019   Sulfa antibiotics Hives and Rash 03/31/2011    Family History  Problem Relation Age of Onset   Depression Mother    Asthma Mother    Mental illness Mother    Other Mother        drug over dose   Depression Father    Mental illness Father    Anesthesia problems Neg Hx    Hypotension Neg Hx    Malignant hyperthermia Neg Hx    Pseudochol deficiency Neg Hx    Colon cancer Neg Hx    Colon polyps Neg Hx     Social History   Socioeconomic History   Marital status: Widowed    Spouse name: Not on file   Number of children: 3   Years of education: Not on file   Highest education level: Not on file  Occupational History   Not on file  Tobacco Use   Smoking status: Former    Current packs/day: 0.00    Average packs/day: 1 pack/day for 8.0 years (8.0 ttl pk-yrs)    Types: Cigarettes    Start date: 04/28/2001    Quit date: 04/28/2009    Years since quitting: 14.0   Smokeless tobacco: Never  Vaping Use   Vaping status: Never Used  Substance and Sexual Activity   Alcohol use: Not Currently    Comment: occ   Drug use: Yes    Types: Marijuana    Comment: sometimes    Sexual activity: Yes    Birth control/protection: Surgical    Comment: tubal  Other Topics Concern   Not on file  Social History Narrative   Not on file   Social Drivers of Health   Financial Resource Strain: Medium Risk (07/12/2021)   Overall Financial Resource Strain (CARDIA)     Difficulty of Paying Living Expenses: Somewhat hard  Food Insecurity: No Food Insecurity (07/12/2021)   Hunger Vital Sign    Worried About Running Out of Food in the Last Year: Never true    Ran Out of Food in the Last Year: Never true  Transportation Needs: No Transportation Needs (07/12/2021)   PRAPARE - Administrator, Civil Service (Medical): No    Lack of Transportation (Non-Medical): No  Physical Activity: Inactive (07/12/2021)   Exercise Vital Sign    Days of Exercise per Week: 0 days    Minutes of Exercise per Session: 0 min  Stress: Stress Concern Present (07/12/2021)   [employer] of Occupational Health - Occupational Stress Questionnaire    Feeling of Stress : To some extent  Social Connections: Socially Isolated (07/12/2021)   Social Connection and Isolation Panel [NHANES]    Frequency of  Communication with Friends and Family: Once a week    Frequency of Social Gatherings with Friends and Family: Once a week    Attends Religious Services: Never    Database administrator or Organizations: No    Attends Banker Meetings: Never    Marital Status: Widowed    Review of Systems: Gen: Denies fever, chills, cold or flulike symptoms, presyncope, syncope. CV: Denies chest pain, palpitations, syncope, peripheral edema, and claudication. Resp: Denies dyspnea at rest, cough, wheezing, coughing up blood, and pleurisy. GI: Denies vomiting blood, jaundice, and fecal incontinence.   Denies dysphagia or odynophagia. Derm: Denies rash, itching, dry skin Psych: Denies depression, anxiety, memory loss, confusion. No homicidal or suicidal ideation.  Heme: Denies bruising, bleeding, and enlarged lymph nodes.  Physical Exam: BP 99/64 (BP Location: Right Arm, Patient Position: Sitting, Cuff Size: Normal)   Pulse 62   Temp 98.5 F (36.9 C) (Oral)   Ht 5\' 8"  (1.727 m)   Wt 161 lb 9.6 oz (73.3 kg)   LMP 05/05/2023 (Approximate)   SpO2 97%   BMI 24.57 kg/m   General:   Alert and oriented. No distress noted. Pleasant and cooperative.  Head:  Normocephalic and atraumatic. Eyes:  Conjuctiva clear without scleral icterus. Heart:  S1, S2 present without murmurs appreciated. Lungs:  Clear to auscultation bilaterally. No wheezes, rales, or rhonchi. No distress.  Abdomen:  +BS, soft, non-tender and non-distended. No rebound or guarding. No HSM or masses noted. Msk:  Symmetrical without gross deformities. Normal posture. Extremities:  Without edema. Neurologic:  Alert and  oriented x4 Psych:  Normal mood and affect.    Assessment:  33 year old female with history of bipolar disorder, depression, anxiety, GERD, Barrett's esophagus, dysphagia, constipation, IDA, presenting today for follow-up.  GERD: Previously responded well to pantoprazole 40 mg daily, but ran out a little over a month ago and is having uncontrolled symptoms. Diet also playing a role.   Barrett's esophagus: Short segment Barrett's esophagus diagnosed at the time of EGD 07/10/2022.  Will need daily PPI indefinitely and surveillance EGD in 5 years.  Dyspepsia: Improved with pantoprazole daily.  Dysphagia: Resolved with management of GERD.  Nothing on EGD in April 2024 to explain dysphagia.  IBS-C: Constipation with associated abdominal cramping likely secondary to IBS.  Recent colonoscopy April 2024 without any significant abnormalities.  Will start her on Linzess.  IDA: Etiology unclear, but likely related to menorrhagia and irregular menstrual cycles.  EGD and colonoscopy completed in April 2024.  No findings to explain IDA.  She did have Barrett's esophagus, small hemorrhoids, and medium sized lipoma in the ascending colon.  Reported low-volume rectal bleeding previously likely secondary to hemorrhoids, but none in quite some time.  Celiac screen also negative last year. While I suspect etiology of IDA is secondary to GYN etiology, we discussed the possibility of givens capsule  to complete GI evaluation, and patient would like to proceed. Will also update labs as last on file is from March 2024 with Hgb 10.3.    Plan:  CBC, iron panel Givens capsule Follow-up with OB/GYN on irregular menstrual cycles/vaginal bleeding. Resume pantoprazole 40 mg daily 30 minutes before breakfast. Reinforced GERD diet/lifestyle.  Written instructions provided on AVS. Start Linzess 145 mcg daily 30 minutes before breakfast.  Samples provided.  Requested progress report in 1 week. Avoid NSAIDs. Follow-up in 3 months or sooner if needed.   Ermalinda Memos, PA-C [facility] Gastroenterology 05/14/2023   I have reviewed the note and  agree with the APP's assessment as described in this progress note  Katrinka Blazing, MD Gastroenterology and Hepatology [facility] Gastroenterology

## 2023-05-14 ENCOUNTER — Encounter: Payer: Self-pay | Admitting: *Deleted

## 2023-05-14 ENCOUNTER — Encounter: Payer: Self-pay | Admitting: Gastroenterology

## 2023-05-14 ENCOUNTER — Ambulatory Visit (INDEPENDENT_AMBULATORY_CARE_PROVIDER_SITE_OTHER): Payer: MEDICAID | Admitting: Gastroenterology

## 2023-05-14 VITALS — BP 99/64 | HR 62 | Temp 98.5°F | Ht 68.0 in | Wt 161.6 lb

## 2023-05-14 DIAGNOSIS — K2270 Barrett's esophagus without dysplasia: Secondary | ICD-10-CM | POA: Insufficient documentation

## 2023-05-14 DIAGNOSIS — D509 Iron deficiency anemia, unspecified: Secondary | ICD-10-CM

## 2023-05-14 DIAGNOSIS — R1310 Dysphagia, unspecified: Secondary | ICD-10-CM

## 2023-05-14 DIAGNOSIS — K219 Gastro-esophageal reflux disease without esophagitis: Secondary | ICD-10-CM

## 2023-05-14 DIAGNOSIS — R1013 Epigastric pain: Secondary | ICD-10-CM

## 2023-05-14 DIAGNOSIS — K581 Irritable bowel syndrome with constipation: Secondary | ICD-10-CM | POA: Insufficient documentation

## 2023-05-14 MED ORDER — PANTOPRAZOLE SODIUM 40 MG PO TBEC
40.0000 mg | DELAYED_RELEASE_TABLET | Freq: Every day | ORAL | 3 refills | Status: DC
Start: 2023-05-14 — End: 2024-01-28

## 2023-05-14 NOTE — Patient Instructions (Signed)
 Please have blood work at [facility] to recheck your hemoglobin and iron levels.  We will be scheduled for a givens capsule to complete GI evaluation of iron deficiency anemia.  As we discussed, it is possible that your irregular vaginal bleeding, heavy menstrual cycles is the cause of iron deficiency anemia.  I recommend that you follow-up with your OB/GYN.  Resume pantoprazole 40 mg daily 30 minutes before breakfast.  I have sent refills to your pharmacy.  Follow a GERD diet:  Avoid fried, fatty, greasy, spicy, citrus foods. Avoid caffeine and carbonated beverages. Avoid chocolate. Try eating 4-6 small meals a day rather than 3 large meals. Do not eat within 3 hours of laying down. Prop head of bed up on wood or bricks to create a 6 inch incline.  For constipation, start Linzess 145 mcg daily.  Take Linzess first thing in the morning, at least 30 minutes before breakfast.  We are providing you with samples today.  Please call or send a MyChart message next week with a progress report.  Avoid NSAID products as much as possible including ibuprofen, Aleve, Advil, BC powders, Goody powders, and anything that says "NSAID" on the package.  I will plan to see you back in 3 months or sooner if needed.  Ermalinda Memos, PA-C [facility] Gastroenterology

## 2023-05-19 ENCOUNTER — Other Ambulatory Visit: Payer: Self-pay | Admitting: Gastroenterology

## 2023-05-19 DIAGNOSIS — K581 Irritable bowel syndrome with constipation: Secondary | ICD-10-CM

## 2023-05-19 MED ORDER — LINACLOTIDE 145 MCG PO CAPS
145.0000 ug | ORAL_CAPSULE | Freq: Every day | ORAL | 5 refills | Status: DC
Start: 2023-05-19 — End: 2024-01-28

## 2023-07-02 ENCOUNTER — Ambulatory Visit (HOSPITAL_COMMUNITY)
Admission: RE | Admit: 2023-07-02 | Discharge: 2023-07-02 | Disposition: A | Discharge status: Home / Self Care | Payer: MEDICAID | Attending: Gastroenterology | Admitting: Gastroenterology

## 2023-07-02 ENCOUNTER — Encounter (HOSPITAL_COMMUNITY)
Admission: RE | Disposition: A | Discharge status: Home / Self Care | Payer: Self-pay | Source: Home / Self Care | Attending: Gastroenterology

## 2023-07-02 DIAGNOSIS — D500 Iron deficiency anemia secondary to blood loss (chronic): Secondary | ICD-10-CM | POA: Insufficient documentation

## 2023-07-02 HISTORY — PX: GIVENS CAPSULE STUDY: SHX5432

## 2023-07-02 LAB — CBC WITH DIFFERENTIAL/PLATELET
Absolute Lymphocytes: 2495 {cells}/uL (ref 850–3900)
Absolute Monocytes: 496 {cells}/uL (ref 200–950)
Basophils Absolute: 67 {cells}/uL (ref 0–200)
Basophils Relative: 0.8 %
Eosinophils Absolute: 428 {cells}/uL (ref 15–500)
Eosinophils Relative: 5.1 %
HCT: 35.7 % (ref 35.0–45.0)
Hemoglobin: 11.9 g/dL (ref 11.7–15.5)
MCH: 28.3 pg (ref 27.0–33.0)
MCHC: 33.3 g/dL (ref 32.0–36.0)
MCV: 84.8 fL (ref 80.0–100.0)
MPV: 10.3 fL (ref 7.5–12.5)
Monocytes Relative: 5.9 %
Neutro Abs: 4914 {cells}/uL (ref 1500–7800)
Neutrophils Relative %: 58.5 %
Platelets: 268 10*3/uL (ref 140–400)
RBC: 4.21 10*6/uL (ref 3.80–5.10)
RDW: 14.3 % (ref 11.0–15.0)
Total Lymphocyte: 29.7 %
WBC: 8.4 10*3/uL (ref 3.8–10.8)

## 2023-07-02 LAB — IRON,TIBC AND FERRITIN PANEL
%SAT: 6 % — ABNORMAL LOW (ref 16–45)
Ferritin: 4 ng/mL — ABNORMAL LOW (ref 16–154)
Iron: 26 ug/dL — ABNORMAL LOW (ref 40–190)
TIBC: 401 ug/dL (ref 250–450)

## 2023-07-02 SURGERY — IMAGING PROCEDURE, GI TRACT, INTRALUMINAL, VIA CAPSULE

## 2023-07-03 ENCOUNTER — Encounter (HOSPITAL_COMMUNITY): Payer: Self-pay | Admitting: Gastroenterology

## 2023-07-03 NOTE — Procedures (Signed)
 Small Bowel Givens Capsule Study Procedure date: 07/02/23  Referring Provider:  PCP PCP:  Dr. Veda Gerald, MD  Indication for procedure:  Iron deficiency anemia  Findings:  Study was adequate as capsule reached the cecum and the bowel preparation was adequate in the small bowel. No abnormalities were found in the gastrointestinal lining.    First Gastric image:  00:01:02 First Duodenal image: 01:05:27 First Cecal image: 05:49:16 Gastric Passage time: 1h 83m Small Bowel Passage time:  4h 61m  Summary & Recommendations: Unremarkable capsule endoscopy, no abrnomalities in the GI tract that can explain iron deficiency anemia.  It is likely her iron deficiency is related to GYN etiology.  Patient should continue oral iron supplementation and follow-up with PCP and GYN.  I personally communicated these recommendations to the patient.  I called to the listed phone number but did not get any answer, I left a detailed voice message with the findings.  Samantha Cress, MD Gastroenterology and Hepatology [facility] Gastroenterology

## 2023-08-12 NOTE — Progress Notes (Deleted)
 Referring Provider: Veda Gerald, MD Primary Care Physician:  Veda Gerald, MD Primary GI Physician: Dr. Sammi Crick  No chief complaint on file.   HPI:   Kimberly Olsen is a 33 y.o. female with GI history of GERD, Barrett's esophagus, dysphagia, constipation, IDA, presenting today for follow-up.  Last seen in the office 05/14/2023.  Resume pantoprazole  40 mg daily due to uncontrolled GERD.  Start Linzess  145 mcg daily for suspected IBS-C (samples provided and requested progress report).  Also scheduled givens capsule to complete GI workup of IDA.  Recommended follow-up with OB/GYN on irregular menstrual cycles/vaginal bleeding.  Givens capsule 07/02/23 with no abnormalities.  Recommended continuing oral iron and follow-up with PCP and GYN.  Today:    Past Medical History:  Diagnosis Date   Anemia    Anxiety    Barrett's esophagus    Bipolar 1 disorder (HCC)    Chlamydia 05/04/2020   Treated 04/1720 with Doxy, POC_______________   Constipation    Depression    GERD (gastroesophageal reflux disease)    Trichimoniasis 10/09/2021   Treated,POC_______    Past Surgical History:  Procedure Laterality Date   BIOPSY  07/10/2022   Procedure: BIOPSY;  Surgeon: Urban Garden, MD;  [facility];  Service: Gastroenterology;;   COLONOSCOPY WITH PROPOFOL  N/A 07/10/2022   Procedure: COLONOSCOPY WITH PROPOFOL ;  Surgeon: Urban Garden, MD;  [facility];  Service: Gastroenterology;  Laterality: N/A;  10:00am, asa 2   ESOPHAGOGASTRODUODENOSCOPY (EGD) WITH PROPOFOL  N/A 07/10/2022   Procedure: ESOPHAGOGASTRODUODENOSCOPY (EGD) WITH PROPOFOL ;  Surgeon: Urban Garden, MD;  [facility];  Service: Gastroenterology;  Laterality: N/A;   GIVENS CAPSULE STUDY N/A 07/02/2023   Procedure: IMAGING PROCEDURE, GI TRACT, INTRALUMINAL, VIA CAPSULE;  Surgeon: Umberto Ganong, Bearl Limes, MD;  [facility];  Service:  Gastroenterology;  Laterality: N/A;  7:30 am   LIPOMA EXCISION N/A 02/17/2019   Procedure: EXCISION 1.5CM LIPOMA BACK;  Surgeon: Awilda Bogus, MD;  Location: [facility];  Service: General;  Laterality: N/A;   OOPHORECTOMY Left    TUBAL LIGATION Bilateral 03/07/2018   Procedure: POST PARTUM TUBAL LIGATION;  Surgeon: Othelia Blinks, MD;  Location: [facility];  Service: Gynecology;  Laterality: Bilateral;    Current Outpatient Medications  Medication Sig Dispense Refill   linaclotide  (LINZESS ) 145 MCG CAPS capsule Take 1 capsule (145 mcg total) by mouth daily before breakfast. 30 capsule 5   pantoprazole  (PROTONIX ) 40 MG tablet Take 1 tablet (40 mg total) by mouth daily before breakfast. 30 minutes before breakfast 90 tablet 3   QUEtiapine (SEROQUEL) 50 MG tablet Take 50 mg by mouth at bedtime.     No current facility-administered medications for this visit.    Allergies as of 08/14/2023 - Review Complete 07/02/2023  Allergen Reaction Noted   Coconut fatty acid Hives 11/24/2010   Lamictal [lamotrigine]  08/20/2019   Sulfa antibiotics Hives and Rash 03/31/2011    Family History  Problem Relation Age of Onset   Depression Mother    Asthma Mother    Mental illness Mother    Other Mother        drug over dose   Depression Father    Mental illness Father    Anesthesia problems Neg Hx    Hypotension Neg Hx    Malignant hyperthermia Neg Hx    Pseudochol deficiency Neg Hx    Colon cancer Neg Hx    Colon polyps Neg  Hx     Social History   Socioeconomic History   Marital status: Widowed    Spouse name: Not on file   Number of children: 3   Years of education: Not on file   Highest education level: Not on file  Occupational History   Not on file  Tobacco Use   Smoking status: Former    Current packs/day: 0.00    Average packs/day: 1 pack/day for 8.0 years (8.0 ttl pk-yrs)    Types: Cigarettes    Start date: 04/28/2001    Quit date: 04/28/2009    Years since  quitting: 14.2   Smokeless tobacco: Never  Vaping Use   Vaping status: Never Used  Substance and Sexual Activity   Alcohol use: Not Currently    Comment: occ   Drug use: Yes    Types: Marijuana    Comment: sometimes    Sexual activity: Yes    Birth control/protection: Surgical    Comment: tubal  Other Topics Concern   Not on file  Social History Narrative   Not on file   Social Drivers of Health   Financial Resource Strain: Medium Risk (07/12/2021)   Overall Financial Resource Strain (CARDIA)    Difficulty of Paying Living Expenses: Somewhat hard  Food Insecurity: No Food Insecurity (07/12/2021)   Hunger Vital Sign    Worried About Running Out of Food in the Last Year: Never true    Ran Out of Food in the Last Year: Never true  Transportation Needs: No Transportation Needs (07/12/2021)   PRAPARE - Administrator, Civil Service (Medical): No    Lack of Transportation (Non-Medical): No  Physical Activity: Inactive (07/12/2021)   Exercise Vital Sign    Days of Exercise per Week: 0 days    Minutes of Exercise per Session: 0 min  Stress: Stress Concern Present (07/12/2021)   [employer] of Occupational Health - Occupational Stress Questionnaire    Feeling of Stress : To some extent  Social Connections: Socially Isolated (07/12/2021)   Social Connection and Isolation Panel [NHANES]    Frequency of Communication with Friends and Family: Once a week    Frequency of Social Gatherings with Friends and Family: Once a week    Attends Religious Services: Never    Database administrator or Organizations: No    Attends Banker Meetings: Never    Marital Status: Widowed    Review of Systems: Gen: Denies fever, chills, anorexia. Denies fatigue, weakness, weight loss.  CV: Denies chest pain, palpitations, syncope, peripheral edema, and claudication. Resp: Denies dyspnea at rest, cough, wheezing, coughing up blood, and pleurisy. GI: Denies vomiting blood,  jaundice, and fecal incontinence.   Denies dysphagia or odynophagia. Derm: Denies rash, itching, dry skin Psych: Denies depression, anxiety, memory loss, confusion. No homicidal or suicidal ideation.  Heme: Denies bruising, bleeding, and enlarged lymph nodes.  Physical Exam: There were no vitals taken for this visit. General:   Alert and oriented. No distress noted. Pleasant and cooperative.  Head:  Normocephalic and atraumatic. Eyes:  Conjuctiva clear without scleral icterus. Heart:  S1, S2 present without murmurs appreciated. Lungs:  Clear to auscultation bilaterally. No wheezes, rales, or rhonchi. No distress.  Abdomen:  +BS, soft, non-tender and non-distended. No rebound or guarding. No HSM or masses noted. Msk:  Symmetrical without gross deformities. Normal posture. Extremities:  Without edema. Neurologic:  Alert and  oriented x4 Psych:  Normal mood and affect.    Assessment:  Plan:  ***   Shana Daring, PA-C [facility] Gastroenterology 08/14/2023

## 2023-08-14 ENCOUNTER — Encounter: Payer: Self-pay | Admitting: Gastroenterology

## 2023-08-14 ENCOUNTER — Ambulatory Visit: Payer: MEDICAID | Admitting: Gastroenterology

## 2023-12-31 ENCOUNTER — Encounter (INDEPENDENT_AMBULATORY_CARE_PROVIDER_SITE_OTHER): Payer: Self-pay | Admitting: Gastroenterology

## 2024-01-07 ENCOUNTER — Ambulatory Visit: Payer: MEDICAID | Admitting: Women's Health

## 2024-01-28 ENCOUNTER — Encounter: Payer: Self-pay | Admitting: Gastroenterology

## 2024-01-28 ENCOUNTER — Ambulatory Visit (INDEPENDENT_AMBULATORY_CARE_PROVIDER_SITE_OTHER): Payer: MEDICAID | Admitting: Gastroenterology

## 2024-01-28 VITALS — BP 104/68 | HR 67 | Temp 99.0°F | Ht 68.0 in | Wt 160.4 lb

## 2024-01-28 DIAGNOSIS — K2100 Gastro-esophageal reflux disease with esophagitis, without bleeding: Secondary | ICD-10-CM

## 2024-01-28 DIAGNOSIS — D509 Iron deficiency anemia, unspecified: Secondary | ICD-10-CM | POA: Insufficient documentation

## 2024-01-28 DIAGNOSIS — R1013 Epigastric pain: Secondary | ICD-10-CM

## 2024-01-28 DIAGNOSIS — K581 Irritable bowel syndrome with constipation: Secondary | ICD-10-CM

## 2024-01-28 DIAGNOSIS — K2270 Barrett's esophagus without dysplasia: Secondary | ICD-10-CM | POA: Diagnosis not present

## 2024-01-28 DIAGNOSIS — K219 Gastro-esophageal reflux disease without esophagitis: Secondary | ICD-10-CM

## 2024-01-28 MED ORDER — PANTOPRAZOLE SODIUM 40 MG PO TBEC
40.0000 mg | DELAYED_RELEASE_TABLET | Freq: Every day | ORAL | 3 refills | Status: AC
Start: 2024-01-28 — End: ?

## 2024-01-28 MED ORDER — LINACLOTIDE 145 MCG PO CAPS
145.0000 ug | ORAL_CAPSULE | Freq: Every day | ORAL | 5 refills | Status: AC
Start: 2024-01-28 — End: ?

## 2024-01-28 NOTE — Patient Instructions (Signed)
 Follow a GERD:  Avoid fried, fatty, greasy, spicy, citrus foods. Avoid caffeine and carbonated beverages. Avoid chocolate. Try eating 4-6 small meals a day rather than 3 large meals. Do not eat within 3 hours of laying down. Prop head of bed up on wood or bricks to create a 6 inch incline.  Pantoprazole  40mg  daily is used to also treat your Barrett's esophagus. Consider taking at least 3 times per week if you do not want to take it daily. You will need a follow up upper endoscopy 06/2027.    For constipation: Continue Linzess  145 mcg daily as needed.    For iron deficiency anemia: Take iron supplement, one every other day Follow up with your gyn for heavy menses which is the source for your anemia.   Avoid NSAID products as much as possible including ibuprofen , Aleve , Advil , BC powders, Goody powders, and anything that says NSAID on the package.  Return to the office in six months or call sooner if needed.

## 2024-01-28 NOTE — Progress Notes (Addendum)
 GI Office Note    Referring Provider: Orpha Yancey LABOR, MD Primary Care Physician:  Orpha Yancey LABOR, MD  Primary Gastroenterologist: Toribio Fortune, MD   Chief Complaint   Chief Complaint  Patient presents with   Follow-up    History of Present Illness   Discussed the use of AI scribe software for clinical note transcription with the patient, who gave verbal consent to proceed.  History of Present Illness Kimberly Olsen is a 33 year old female with Barrett's esophagus, GERD, IDA, IBS-C who presents for follow-up. Last seen 04/2023.   She experiences chronic constipation, managed with intermittent use of Linzess , taken every couple of days. Tries to not go multiple days without a BM, especially after episode of constipation resulted in severe abdominal cramps and pain, described as 'contractions', which resolved immediately after bowel movement. These episodes sometimes require multiple trips to the bathroom for relief.  She uses pantoprazole  sparingly, only when anticipating dietary triggers, and actively manages her diet to reduce medication use. Swallowing difficulties have improved, which were previously problematic.  She has a history of anemia with previously low ferritin levels. Although her hemoglobin is now normal, her ferriten and iron levels remain low. She experiences very heavy menstrual cycles, lasting about ten days with three particularly heavy days. Recently, she had an irregular cycle with a second period shortly after the first, which was extremely heavy, necessitating frequent super tampon changes hourly. She takes a daily multivitamin and uses iron supplements, though she recently ran out. She has upcoming ov with her gyn.       Prior Data     Results LABS    Previous celiac screen negative.   December 19, 2023: Hemoglobin 12.4, hematocrit 39.2, iron 27, TIBC 388, iron sat 7% low, ferritin 11 low B12 596, folate 14.1, TSH 1.07  July 01, 2023: Iron  26 low, TIBC 401, iron sat 6% low, ferritin 4 low, hemoglobin 11.9 low normal, hematocrit 35.7, MCV 84.8  RUQ ultrasound 06/12/2022 with no cholelithiasis or cholecystitis, hepatic steatosis.   Colonoscopy 07/10/22: Small external hemorrhoids, small nonbleeding internal hemorrhoids, normal terminal ileum, medium sized lipoma in the ascending colon.   EGD 07/10/2022: Irregular Z -line biopsied, 1 cm hiatal hernia, normal stomach.  Pathology showed nondysplastic Barrett's esophagus (short segment).  Recommended repeat EGD in 5 years.   Small bowel capsule study July 02, 2023: Unremarkable, IDA felt to be related to GYN etiology    Medications   Current Outpatient Medications  Medication Sig Dispense Refill   linaclotide  (LINZESS ) 145 MCG CAPS capsule Take 1 capsule (145 mcg total) by mouth daily before breakfast. 30 capsule 5   pantoprazole  (PROTONIX ) 40 MG tablet Take 1 tablet (40 mg total) by mouth daily before breakfast. 30 minutes before breakfast 90 tablet 3   QUEtiapine (SEROQUEL) 50 MG tablet Take 50 mg by mouth at bedtime.     No current facility-administered medications for this visit.    Allergies   Allergies as of 01/28/2024 - Review Complete 01/28/2024  Allergen Reaction Noted   Coconut fatty acid Hives 11/24/2010   Lamictal [lamotrigine]  08/20/2019   Sulfa antibiotics Hives and Rash 03/31/2011      Review of Systems   General: Negative for anorexia, weight loss, fever, chills, fatigue, weakness. ENT: Negative for hoarseness, difficulty swallowing , nasal congestion. CV: Negative for chest pain, angina, palpitations, dyspnea on exertion, peripheral edema.  Respiratory: Negative for dyspnea at rest, dyspnea on exertion, cough, sputum, wheezing.  GI: See history of present illness. GU:  Negative for dysuria, hematuria, urinary incontinence, urinary frequency, nocturnal urination. See hpi Endo: Negative for unusual weight change.     Physical Exam   BP 104/68    Pulse 67   Temp 99 F (37.2 C) (Oral)   Ht 5' 8 (1.727 m)   Wt 160 lb 6.4 oz (72.8 kg)   LMP  (LMP Unknown)   SpO2 99%   BMI 24.39 kg/m    General: Well-nourished, well-developed in no acute distress.  Eyes: No icterus. Mouth: Oropharyngeal mucosa moist and pink   Abdomen: Bowel sounds are normal, nondistended, no hepatosplenomegaly or masses,  no abdominal bruits or hernia , no rebound or guarding. Mild epigastric tenderness Rectal: not performed Extremities: No lower extremity edema. No clubbing or deformities. Neuro: Alert and oriented x 4   Skin: Warm and dry, no jaundice.   Psych: Alert and cooperative, normal mood and affect.  Labs   See above Imaging Studies   No results found.  Assessment/Plan:    Assessment & Plan Barrett's esophagus/GERD: - She decreased pantoprazole  to as needed dosing, managing her reflux with dietary measures and lifestyle changes. Discussed taking pantoprazole  at least 3 days per week due to her Barrett's.  - EGD 06/2027 - return ov in six months.    Iron deficiency anemia secondary to heavy and irregular menstrual bleeding Iron deficiency anemia due to heavy menstrual bleeding. GI work up without explanation for anemia. Hemoglobin normal, ferritin low at 11. - Resume iron supplementation every other day. - Continue follow up with gynecologist next month. - continue to monitor labs via PCP -avoid NSAIDs  IBS-Chronic constipation - Continue Linzess  as needed.      Sonny RAMAN. Ezzard, MHS, PA-C [facility]  I have reviewed the note and agree with the APP's assessment as described in this progress note  She should be taking PPI on a daily disease given her history of Barrett's esophagus but she was to only take it as needed.  It is informed for her to take this on a regular basis to prevent progression of her Barrett's.  Toribio Fortune, MD Gastroenterology and Hepatology [facility] [place] [facility]  Gastroenterology

## 2024-02-25 ENCOUNTER — Ambulatory Visit: Payer: MEDICAID | Admitting: Women's Health

## 2024-04-05 ENCOUNTER — Other Ambulatory Visit (HOSPITAL_COMMUNITY)
Admission: RE | Admit: 2024-04-05 | Discharge: 2024-04-05 | Disposition: A | Discharge status: Home / Self Care | Payer: MEDICAID | Source: Ambulatory Visit | Attending: Women's Health | Admitting: Women's Health

## 2024-04-05 ENCOUNTER — Encounter: Payer: Self-pay | Admitting: Women's Health

## 2024-04-05 ENCOUNTER — Ambulatory Visit: Payer: MEDICAID | Admitting: Women's Health

## 2024-04-05 VITALS — BP 117/79 | HR 114 | Ht 68.0 in | Wt 161.0 lb

## 2024-04-05 DIAGNOSIS — A599 Trichomoniasis, unspecified: Secondary | ICD-10-CM

## 2024-04-05 DIAGNOSIS — Z8619 Personal history of other infectious and parasitic diseases: Secondary | ICD-10-CM | POA: Diagnosis not present

## 2024-04-05 DIAGNOSIS — Z124 Encounter for screening for malignant neoplasm of cervix: Secondary | ICD-10-CM | POA: Diagnosis present

## 2024-04-05 DIAGNOSIS — Z1151 Encounter for screening for human papillomavirus (HPV): Secondary | ICD-10-CM

## 2024-04-05 DIAGNOSIS — Z01419 Encounter for gynecological examination (general) (routine) without abnormal findings: Secondary | ICD-10-CM

## 2024-04-05 DIAGNOSIS — N930 Postcoital and contact bleeding: Secondary | ICD-10-CM | POA: Diagnosis not present

## 2024-04-05 DIAGNOSIS — Z01411 Encounter for gynecological examination (general) (routine) with abnormal findings: Secondary | ICD-10-CM

## 2024-04-05 NOTE — Progress Notes (Signed)
 "  WELL-WOMAN EXAMINATION Patient name: Kimberly Olsen MRN [id number]  Date of birth: Apr 01, 1990 Chief Complaint:   Annual Exam  History of Present Illness:   Kimberly Olsen is a 34 y.o. G51P3003 Caucasian female being seen today for a routine well-woman exam.  Current complaints: none  PCP: Hasanaj      does not desire labs Patient's last menstrual period was 03/24/2024 (exact date). The current method of family planning is tubal ligation.  Last pap 05/01/20. Results were: NILM w/ HRHPV negative. H/O abnormal pap: yes 2010-2014 Last mammogram: never. Results were: N/A. Family h/o breast cancer: no Last colonoscopy: 2024. Results were: normal. Family h/o colorectal cancer: no     07/12/2021    3:31 PM 05/01/2020    1:41 PM 08/20/2019   12:46 PM 09/24/2018    3:22 PM 08/27/2018    4:38 PM  Depression screen PHQ 2/9  Decreased Interest 1 1 1 2 2   Down, Depressed, Hopeless 1 1 1 1 3   PHQ - 2 Score 2 2 2 3 5   Altered sleeping 1 3 1 3 3   Tired, decreased energy 1 2 1 2 3   Change in appetite 0 1 0 0 0  Feeling bad or failure about yourself  0 1 0 1 2  Trouble concentrating 1 0 1 1 2   Moving slowly or fidgety/restless 0 0 0 2 3  Suicidal thoughts 0 0 0 0 0  PHQ-9 Score 5  9  5  12  18    Difficult doing work/chores    Somewhat difficult      Data saved with a previous flowsheet row definition        07/12/2021    3:32 PM 05/01/2020    1:42 PM 08/20/2019   12:47 PM  GAD 7 : Generalized Anxiety Score  Nervous, Anxious, on Edge 1 3 1   Control/stop worrying 1 3 1   Worry too much - different things 1 3 1   Trouble relaxing 1 3 1   Restless 1 3 1   Easily annoyed or irritable 1 3 1   Afraid - awful might happen 1 3 1   Total GAD 7 Score 7 21 7      Review of Systems:   Pertinent items are noted in HPI Denies any headaches, blurred vision, fatigue, shortness of breath, chest pain, abdominal pain, abnormal vaginal discharge/itching/odor/irritation, problems with periods, bowel movements,  urination, or intercourse unless otherwise stated above. Pertinent History Reviewed:  Reviewed past medical,surgical, social and family history.  Reviewed problem list, medications and allergies. Physical Assessment:   Vitals:   04/05/24 0852  BP: 117/79  Pulse: (!) 114  Weight: 161 lb (73 kg)  Height: 5' 8 (1.727 m)  Body mass index is 24.48 kg/m.        Physical Examination:   General appearance - well appearing, and in no distress  Mental status - alert, oriented to person, place, and time  Psych:  She has a normal mood and affect  Skin - warm and dry, normal color, no suspicious lesions noted  Chest - effort normal, all lung fields clear to auscultation bilaterally  Heart - normal rate and regular rhythm  Neck:  midline trachea, no thyromegaly or nodules  Breasts - breasts appear normal, no suspicious masses, no skin or nipple changes or  axillary nodes  Abdomen - soft, nontender, nondistended, no masses or organomegaly  Pelvic - VULVA: normal appearing vulva with no masses, tenderness or lesions  VAGINA: normal appearing vagina with normal  color and discharge, no lesions  CERVIX: normal appearing cervix without discharge or lesions, no CMT, friable  Thin prep pap is done w/ HR HPV cotesting  UTERUS: uterus is felt to be normal size, shape, consistency and nontender   ADNEXA: No adnexal masses or tenderness noted.  Extremities:  No swelling or varicosities noted  Chaperone: Aleck Blase  No results found for this or any previous visit (from the past 24 hours).  Assessment & Plan:  1) Well-Woman Exam  2) H/O trichomonas>2023, POC today from pap  3) Friable cx> postcoital bleeding as well, will check gc/ct/trich on pap  Labs/procedures today: as below  Mammogram: @ 36yo, or sooner if problems Colonoscopy: @ 36yo, or sooner if problems  No orders of the defined types were placed in this encounter.   Meds: No orders of the defined types were placed in this  encounter.   Follow-up: Return in about 1 year (around 04/05/2025) for Physical.  Suzen JONELLE Fetters CNM, WHNP-BC 04/05/2024 9:09 AM  "

## 2024-04-06 ENCOUNTER — Ambulatory Visit: Payer: Self-pay | Admitting: Women's Health

## 2024-04-06 DIAGNOSIS — A599 Trichomoniasis, unspecified: Secondary | ICD-10-CM

## 2024-04-06 LAB — CYTOLOGY - PAP
Chlamydia: NEGATIVE
Comment: NEGATIVE
Comment: NEGATIVE
Comment: NEGATIVE
Comment: NORMAL
Diagnosis: NEGATIVE
High risk HPV: NEGATIVE
Neisseria Gonorrhea: NEGATIVE
Trichomonas: NEGATIVE
